# Patient Record
Sex: Female | Born: 1955 | Race: White | Hispanic: No | Marital: Married | Smoking: Current every day smoker
Health system: Southern US, Community
[De-identification: ages and names within clinical notes are randomized; demographics above are authoritative.]

## PROBLEM LIST (undated history)

## (undated) ENCOUNTER — Emergency Department (HOSPITAL_COMMUNITY): Disposition: A | Discharge status: Home / Self Care | Payer: Self-pay

## (undated) DIAGNOSIS — F32A Depression, unspecified: Secondary | ICD-10-CM

## (undated) DIAGNOSIS — L409 Psoriasis, unspecified: Secondary | ICD-10-CM

## (undated) DIAGNOSIS — Z9889 Other specified postprocedural states: Secondary | ICD-10-CM

## (undated) DIAGNOSIS — F419 Anxiety disorder, unspecified: Secondary | ICD-10-CM

## (undated) DIAGNOSIS — E7800 Pure hypercholesterolemia, unspecified: Secondary | ICD-10-CM

## (undated) DIAGNOSIS — G629 Polyneuropathy, unspecified: Secondary | ICD-10-CM

## (undated) DIAGNOSIS — I10 Essential (primary) hypertension: Secondary | ICD-10-CM

## (undated) DIAGNOSIS — K219 Gastro-esophageal reflux disease without esophagitis: Secondary | ICD-10-CM

## (undated) DIAGNOSIS — R112 Nausea with vomiting, unspecified: Secondary | ICD-10-CM

## (undated) DIAGNOSIS — M1990 Unspecified osteoarthritis, unspecified site: Secondary | ICD-10-CM

## (undated) DIAGNOSIS — R51 Headache: Secondary | ICD-10-CM

## (undated) DIAGNOSIS — F329 Major depressive disorder, single episode, unspecified: Secondary | ICD-10-CM

## (undated) HISTORY — PX: NECK SURGERY: SHX720

## (undated) HISTORY — PX: CARDIAC CATHETERIZATION: SHX172

---

## 1987-07-26 HISTORY — PX: TUBAL LIGATION: SHX77

## 2001-02-20 ENCOUNTER — Ambulatory Visit (HOSPITAL_COMMUNITY): Admission: RE | Admit: 2001-02-20 | Discharge: 2001-02-20 | Payer: Self-pay | Admitting: Orthopedic Surgery

## 2001-02-20 ENCOUNTER — Encounter: Payer: Self-pay | Admitting: Orthopedic Surgery

## 2001-03-05 ENCOUNTER — Ambulatory Visit (HOSPITAL_COMMUNITY): Admission: RE | Admit: 2001-03-05 | Discharge: 2001-03-05 | Payer: Self-pay | Admitting: Orthopedic Surgery

## 2001-07-25 HISTORY — PX: CHOLECYSTECTOMY: SHX55

## 2001-08-29 ENCOUNTER — Ambulatory Visit (HOSPITAL_COMMUNITY): Admission: RE | Admit: 2001-08-29 | Discharge: 2001-08-29 | Payer: Self-pay | Admitting: *Deleted

## 2001-08-29 ENCOUNTER — Encounter: Payer: Self-pay | Admitting: *Deleted

## 2001-10-15 ENCOUNTER — Ambulatory Visit (HOSPITAL_COMMUNITY): Admission: RE | Admit: 2001-10-15 | Discharge: 2001-10-15 | Payer: Self-pay | Admitting: Unknown Physician Specialty

## 2001-10-16 ENCOUNTER — Encounter: Payer: Self-pay | Admitting: Unknown Physician Specialty

## 2001-10-24 ENCOUNTER — Ambulatory Visit (HOSPITAL_BASED_OUTPATIENT_CLINIC_OR_DEPARTMENT_OTHER): Admission: RE | Admit: 2001-10-24 | Discharge: 2001-10-24 | Payer: Self-pay | Admitting: Orthopedic Surgery

## 2001-11-21 ENCOUNTER — Emergency Department (HOSPITAL_COMMUNITY): Admission: EM | Admit: 2001-11-21 | Discharge: 2001-11-21 | Payer: Self-pay | Admitting: *Deleted

## 2001-11-21 ENCOUNTER — Encounter: Payer: Self-pay | Admitting: Internal Medicine

## 2001-11-22 ENCOUNTER — Inpatient Hospital Stay (HOSPITAL_COMMUNITY): Admission: AD | Admit: 2001-11-22 | Discharge: 2001-11-24 | Payer: Self-pay | Admitting: General Surgery

## 2001-11-22 ENCOUNTER — Ambulatory Visit (HOSPITAL_COMMUNITY): Admission: RE | Admit: 2001-11-22 | Discharge: 2001-11-22 | Payer: Self-pay | Admitting: Internal Medicine

## 2001-11-22 ENCOUNTER — Encounter: Payer: Self-pay | Admitting: Internal Medicine

## 2001-11-27 ENCOUNTER — Emergency Department (HOSPITAL_COMMUNITY): Admission: EM | Admit: 2001-11-27 | Discharge: 2001-11-27 | Payer: Self-pay | Admitting: *Deleted

## 2001-11-27 ENCOUNTER — Encounter: Payer: Self-pay | Admitting: *Deleted

## 2002-01-14 ENCOUNTER — Ambulatory Visit (HOSPITAL_COMMUNITY): Admission: RE | Admit: 2002-01-14 | Discharge: 2002-01-14 | Payer: Self-pay | Admitting: Family Medicine

## 2002-01-14 ENCOUNTER — Encounter: Payer: Self-pay | Admitting: Family Medicine

## 2002-09-13 ENCOUNTER — Encounter: Payer: Self-pay | Admitting: *Deleted

## 2002-09-13 ENCOUNTER — Ambulatory Visit (HOSPITAL_COMMUNITY): Admission: RE | Admit: 2002-09-13 | Discharge: 2002-09-13 | Payer: Self-pay | Admitting: *Deleted

## 2002-11-12 ENCOUNTER — Ambulatory Visit (HOSPITAL_COMMUNITY): Admission: RE | Admit: 2002-11-12 | Discharge: 2002-11-12 | Payer: Self-pay | Admitting: Internal Medicine

## 2002-11-12 ENCOUNTER — Encounter: Payer: Self-pay | Admitting: Internal Medicine

## 2002-11-13 ENCOUNTER — Emergency Department (HOSPITAL_COMMUNITY): Admission: EM | Admit: 2002-11-13 | Discharge: 2002-11-13 | Payer: Self-pay | Admitting: Emergency Medicine

## 2002-11-25 ENCOUNTER — Encounter: Admission: RE | Admit: 2002-11-25 | Discharge: 2002-11-25 | Payer: Self-pay | Admitting: *Deleted

## 2002-11-25 ENCOUNTER — Encounter: Payer: Self-pay | Admitting: *Deleted

## 2002-12-06 ENCOUNTER — Ambulatory Visit (HOSPITAL_COMMUNITY): Admission: RE | Admit: 2002-12-06 | Discharge: 2002-12-07 | Payer: Self-pay | Admitting: Neurosurgery

## 2002-12-06 ENCOUNTER — Encounter: Payer: Self-pay | Admitting: Neurosurgery

## 2003-05-03 ENCOUNTER — Encounter: Payer: Self-pay | Admitting: Neurosurgery

## 2003-05-03 ENCOUNTER — Encounter: Admission: RE | Admit: 2003-05-03 | Discharge: 2003-05-03 | Payer: Self-pay | Admitting: Neurosurgery

## 2003-05-07 ENCOUNTER — Encounter: Payer: Self-pay | Admitting: Neurosurgery

## 2003-05-09 ENCOUNTER — Encounter: Payer: Self-pay | Admitting: Neurosurgery

## 2003-05-09 ENCOUNTER — Ambulatory Visit (HOSPITAL_COMMUNITY): Admission: RE | Admit: 2003-05-09 | Discharge: 2003-05-10 | Payer: Self-pay | Admitting: Neurosurgery

## 2003-09-19 ENCOUNTER — Ambulatory Visit (HOSPITAL_COMMUNITY): Admission: RE | Admit: 2003-09-19 | Discharge: 2003-09-19 | Payer: Self-pay | Admitting: *Deleted

## 2004-05-05 ENCOUNTER — Ambulatory Visit (HOSPITAL_COMMUNITY): Admission: RE | Admit: 2004-05-05 | Discharge: 2004-05-05 | Payer: Self-pay | Admitting: Orthopedic Surgery

## 2004-07-25 HISTORY — PX: FOOT SURGERY: SHX648

## 2004-07-25 HISTORY — PX: ENDOMETRIAL ABLATION: SHX621

## 2004-09-24 ENCOUNTER — Ambulatory Visit (HOSPITAL_COMMUNITY): Admission: RE | Admit: 2004-09-24 | Discharge: 2004-09-24 | Payer: Self-pay | Admitting: *Deleted

## 2004-09-29 ENCOUNTER — Other Ambulatory Visit: Admission: RE | Admit: 2004-09-29 | Discharge: 2004-09-29 | Payer: Self-pay | Admitting: *Deleted

## 2004-10-15 ENCOUNTER — Ambulatory Visit (HOSPITAL_COMMUNITY): Admission: RE | Admit: 2004-10-15 | Discharge: 2004-10-15 | Payer: Self-pay | Admitting: *Deleted

## 2004-10-25 ENCOUNTER — Ambulatory Visit (HOSPITAL_COMMUNITY): Admission: RE | Admit: 2004-10-25 | Discharge: 2004-10-25 | Payer: Self-pay | Admitting: Podiatry

## 2004-10-29 ENCOUNTER — Ambulatory Visit (HOSPITAL_COMMUNITY): Admission: RE | Admit: 2004-10-29 | Discharge: 2004-10-29 | Payer: Self-pay | Admitting: *Deleted

## 2004-11-26 ENCOUNTER — Ambulatory Visit (HOSPITAL_COMMUNITY): Admission: RE | Admit: 2004-11-26 | Discharge: 2004-11-26 | Payer: Self-pay | Admitting: *Deleted

## 2005-03-17 ENCOUNTER — Ambulatory Visit: Payer: Self-pay | Admitting: Orthopedic Surgery

## 2005-06-13 ENCOUNTER — Ambulatory Visit: Payer: Self-pay | Admitting: Orthopedic Surgery

## 2005-06-30 ENCOUNTER — Ambulatory Visit: Payer: Self-pay | Admitting: Orthopedic Surgery

## 2005-07-11 ENCOUNTER — Ambulatory Visit: Payer: Self-pay | Admitting: Orthopedic Surgery

## 2005-07-25 HISTORY — PX: REPLACEMENT TOTAL KNEE BILATERAL: SUR1225

## 2005-08-01 ENCOUNTER — Ambulatory Visit: Payer: Self-pay | Admitting: Orthopedic Surgery

## 2005-09-12 ENCOUNTER — Ambulatory Visit: Payer: Self-pay | Admitting: Orthopedic Surgery

## 2005-10-18 ENCOUNTER — Encounter (INDEPENDENT_AMBULATORY_CARE_PROVIDER_SITE_OTHER): Payer: Self-pay | Admitting: Specialist

## 2005-10-18 ENCOUNTER — Inpatient Hospital Stay (HOSPITAL_COMMUNITY): Admission: RE | Admit: 2005-10-18 | Discharge: 2005-10-21 | Payer: Self-pay | Admitting: Orthopedic Surgery

## 2005-10-18 ENCOUNTER — Ambulatory Visit: Payer: Self-pay | Admitting: Orthopedic Surgery

## 2005-11-07 ENCOUNTER — Encounter (HOSPITAL_COMMUNITY): Admission: RE | Admit: 2005-11-07 | Discharge: 2005-12-07 | Payer: Self-pay | Admitting: Orthopedic Surgery

## 2005-11-21 ENCOUNTER — Ambulatory Visit: Payer: Self-pay | Admitting: Orthopedic Surgery

## 2005-11-22 ENCOUNTER — Ambulatory Visit (HOSPITAL_COMMUNITY): Admission: RE | Admit: 2005-11-22 | Discharge: 2005-11-22 | Payer: Self-pay | Admitting: Obstetrics and Gynecology

## 2006-01-02 IMAGING — CR DG FOOT 2V*R*
2 series · 2 of 2 positions shown · non-contrast
Comparison: none

HISTORY: Right ganglion cyst, bone spur, preoperative evaluation

[view not recorded (1 of 2)]
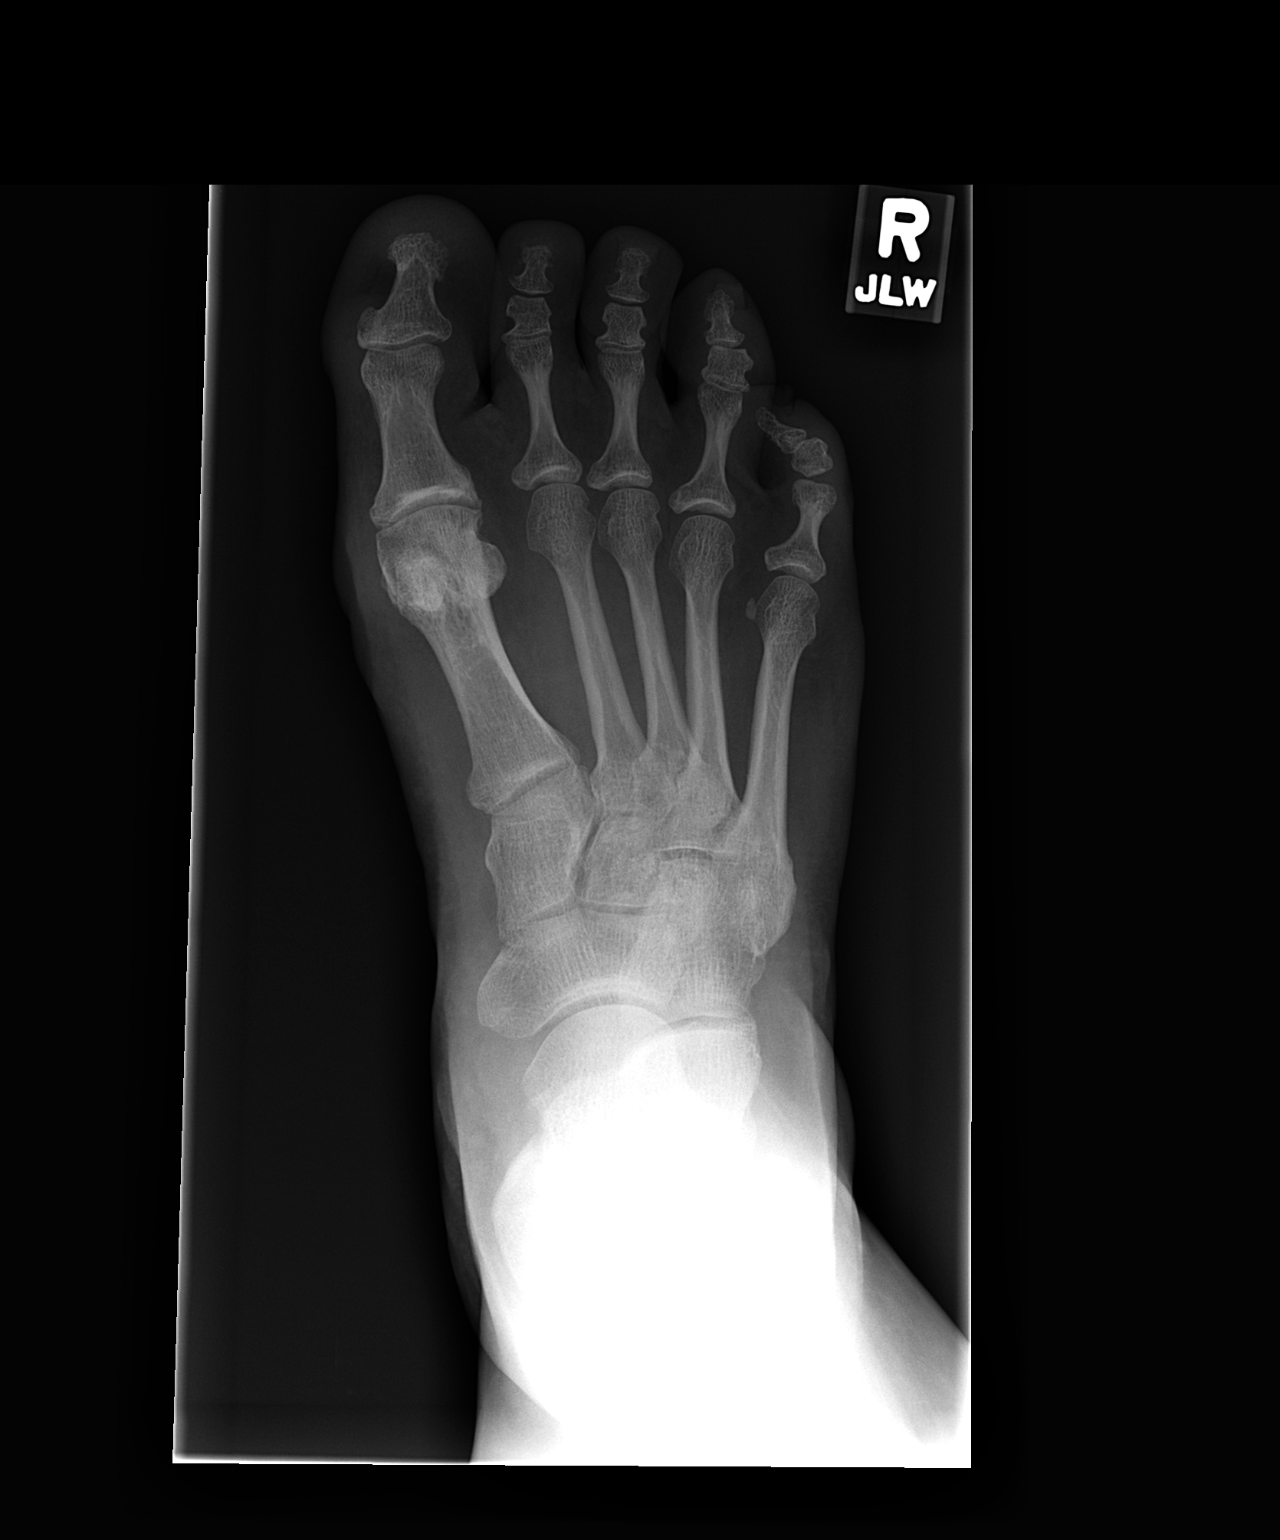

[view not recorded (2 of 2)]
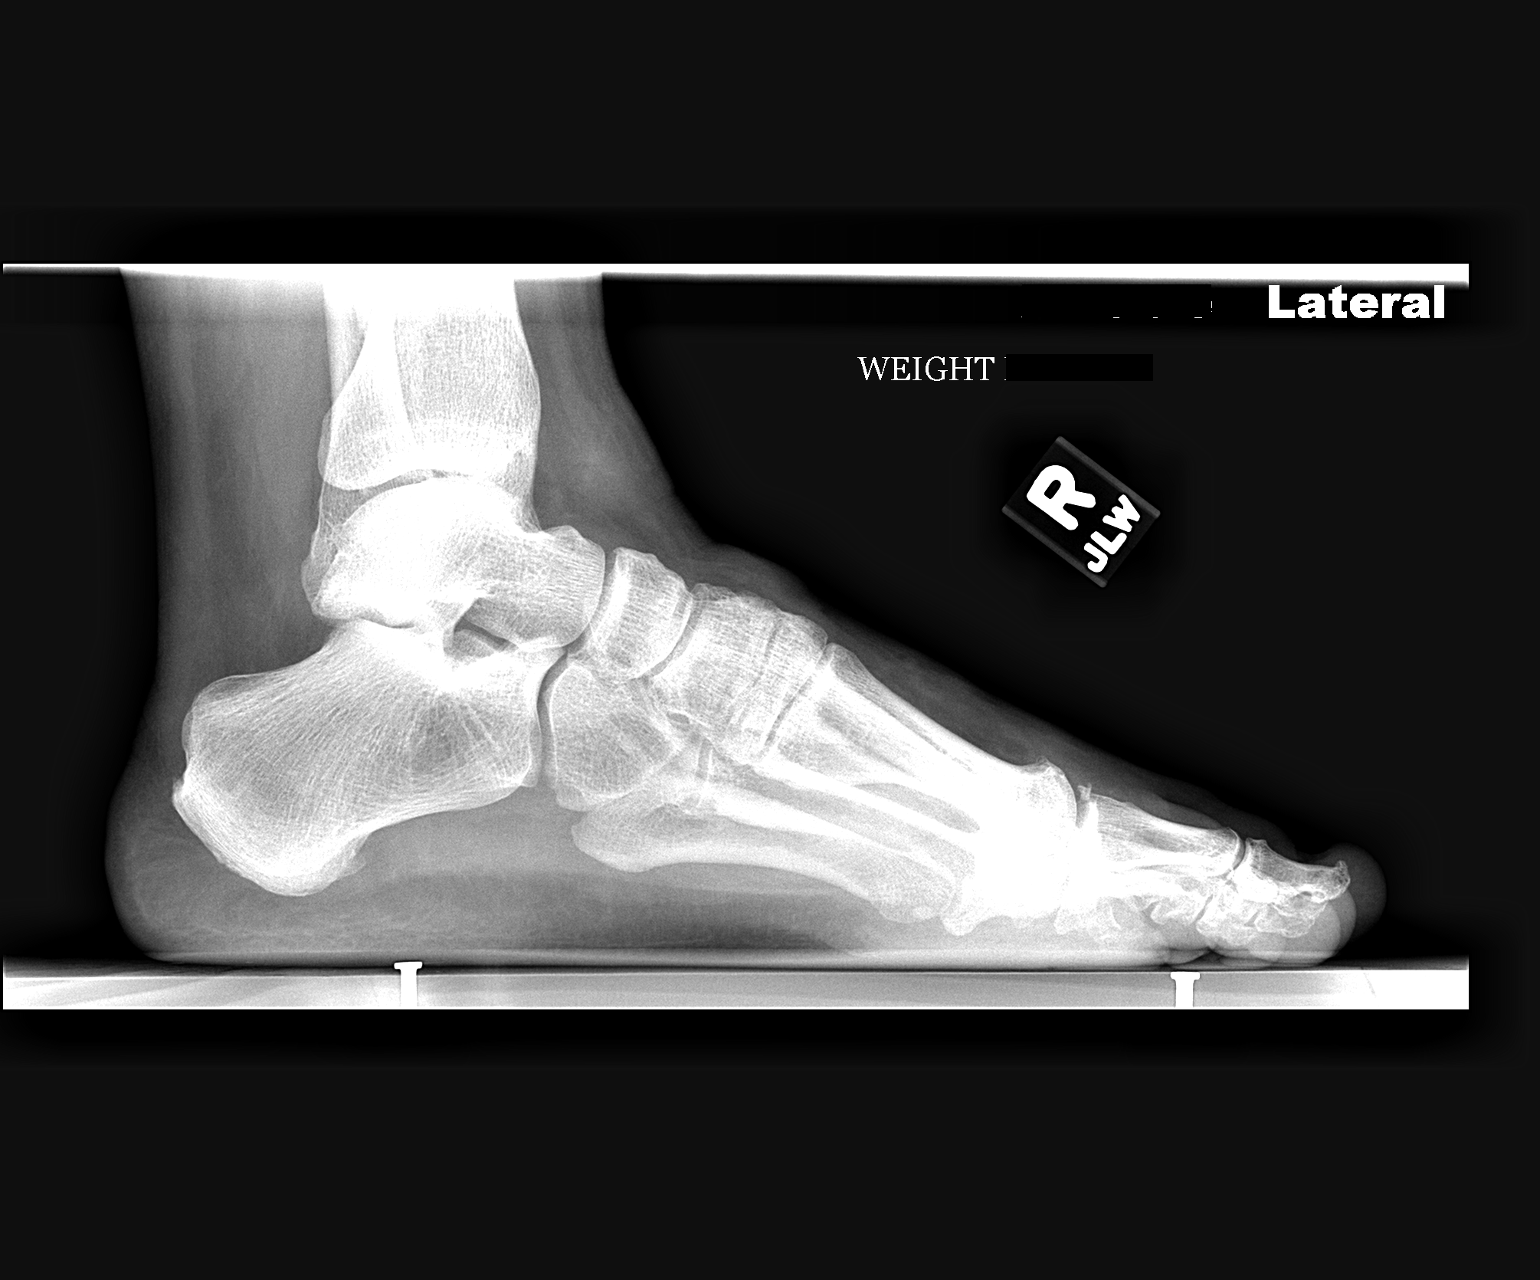

[2 of 2 positions shown; findings below may reference images not displayed]

RIGHT FOOT 2 VIEWS:

Weight-bearing AP and lateral views.
Mild degenerative changes first MTP joint.
No fracture, dislocation, or bone destruction.
Small spur or ossicle noted the dorsal margin of base of proximal phalanx great
toe.
No definite soft tissue mass radiographically evident, though there is slight
soft tissue swelling anterior to the tibiotalar joint on lateral view.
IMPRESSION: Degenerative changes first MTP joint.
Mild anterior soft tissue swelling at ankle.

## 2006-01-16 ENCOUNTER — Ambulatory Visit: Payer: Self-pay | Admitting: Orthopedic Surgery

## 2006-02-02 ENCOUNTER — Ambulatory Visit: Payer: Self-pay | Admitting: Orthopedic Surgery

## 2006-02-24 ENCOUNTER — Encounter (INDEPENDENT_AMBULATORY_CARE_PROVIDER_SITE_OTHER): Payer: Self-pay | Admitting: Specialist

## 2006-02-24 ENCOUNTER — Ambulatory Visit: Payer: Self-pay | Admitting: Orthopedic Surgery

## 2006-02-24 ENCOUNTER — Inpatient Hospital Stay (HOSPITAL_COMMUNITY): Admission: RE | Admit: 2006-02-24 | Discharge: 2006-02-27 | Payer: Self-pay | Admitting: Orthopedic Surgery

## 2006-03-06 ENCOUNTER — Ambulatory Visit: Payer: Self-pay | Admitting: Orthopedic Surgery

## 2006-03-20 ENCOUNTER — Ambulatory Visit: Payer: Self-pay | Admitting: Orthopedic Surgery

## 2006-04-03 ENCOUNTER — Ambulatory Visit: Payer: Self-pay | Admitting: Orthopedic Surgery

## 2006-06-26 ENCOUNTER — Ambulatory Visit: Payer: Self-pay | Admitting: Orthopedic Surgery

## 2006-08-13 ENCOUNTER — Emergency Department (HOSPITAL_COMMUNITY): Admission: EM | Admit: 2006-08-13 | Discharge: 2006-08-13 | Payer: Self-pay | Admitting: Emergency Medicine

## 2006-09-25 ENCOUNTER — Ambulatory Visit: Payer: Self-pay | Admitting: Orthopedic Surgery

## 2006-11-21 ENCOUNTER — Emergency Department (HOSPITAL_COMMUNITY): Admission: EM | Admit: 2006-11-21 | Discharge: 2006-11-21 | Payer: Self-pay | Admitting: Emergency Medicine

## 2006-12-14 ENCOUNTER — Ambulatory Visit: Payer: Self-pay | Admitting: Orthopedic Surgery

## 2007-07-10 ENCOUNTER — Ambulatory Visit: Payer: Self-pay | Admitting: Orthopedic Surgery

## 2007-07-10 DIAGNOSIS — M25569 Pain in unspecified knee: Secondary | ICD-10-CM | POA: Insufficient documentation

## 2007-07-10 DIAGNOSIS — IMO0002 Reserved for concepts with insufficient information to code with codable children: Secondary | ICD-10-CM | POA: Insufficient documentation

## 2007-07-26 HISTORY — PX: COLONOSCOPY: SHX174

## 2007-11-30 ENCOUNTER — Ambulatory Visit (HOSPITAL_BASED_OUTPATIENT_CLINIC_OR_DEPARTMENT_OTHER): Admission: RE | Admit: 2007-11-30 | Discharge: 2007-11-30 | Payer: Self-pay | Admitting: Internal Medicine

## 2007-12-01 ENCOUNTER — Ambulatory Visit: Payer: Self-pay | Admitting: Internal Medicine

## 2007-12-17 ENCOUNTER — Encounter: Payer: Self-pay | Admitting: Orthopedic Surgery

## 2007-12-17 ENCOUNTER — Emergency Department (HOSPITAL_COMMUNITY): Admission: EM | Admit: 2007-12-17 | Discharge: 2007-12-17 | Payer: Self-pay | Admitting: Emergency Medicine

## 2007-12-18 ENCOUNTER — Ambulatory Visit: Payer: Self-pay | Admitting: Orthopedic Surgery

## 2008-01-30 ENCOUNTER — Ambulatory Visit: Payer: Self-pay | Admitting: Orthopedic Surgery

## 2008-02-06 ENCOUNTER — Ambulatory Visit (HOSPITAL_COMMUNITY): Admission: RE | Admit: 2008-02-06 | Discharge: 2008-02-06 | Payer: Self-pay | Admitting: Obstetrics and Gynecology

## 2008-06-30 ENCOUNTER — Ambulatory Visit (HOSPITAL_COMMUNITY): Admission: RE | Admit: 2008-06-30 | Discharge: 2008-06-30 | Payer: Self-pay | Admitting: Family Medicine

## 2008-07-02 ENCOUNTER — Ambulatory Visit: Payer: Self-pay | Admitting: Gastroenterology

## 2008-07-02 LAB — CONVERTED CEMR LAB
AST: 23 units/L (ref 0–37)
Albumin: 4.4 g/dL (ref 3.5–5.2)
Alkaline Phosphatase: 95 units/L (ref 39–117)
BUN: 13 mg/dL (ref 6–23)
Creatinine, Ser: 1.02 mg/dL (ref 0.40–1.20)
Glucose, Bld: 121 mg/dL — ABNORMAL HIGH (ref 70–99)
TSH: 3.073 microintl units/mL (ref 0.350–4.50)
Total Bilirubin: 0.4 mg/dL (ref 0.3–1.2)

## 2008-07-04 ENCOUNTER — Ambulatory Visit: Payer: Self-pay | Admitting: Gastroenterology

## 2008-07-04 ENCOUNTER — Ambulatory Visit (HOSPITAL_COMMUNITY): Admission: RE | Admit: 2008-07-04 | Discharge: 2008-07-04 | Payer: Self-pay | Admitting: Gastroenterology

## 2008-07-04 ENCOUNTER — Encounter: Payer: Self-pay | Admitting: Gastroenterology

## 2008-07-09 ENCOUNTER — Other Ambulatory Visit: Admission: RE | Admit: 2008-07-09 | Discharge: 2008-07-09 | Payer: Self-pay | Admitting: Obstetrics and Gynecology

## 2008-07-10 ENCOUNTER — Ambulatory Visit (HOSPITAL_COMMUNITY): Admission: RE | Admit: 2008-07-10 | Discharge: 2008-07-10 | Payer: Self-pay | Admitting: Obstetrics and Gynecology

## 2008-08-13 ENCOUNTER — Ambulatory Visit: Payer: Self-pay | Admitting: Gastroenterology

## 2008-09-08 ENCOUNTER — Emergency Department (HOSPITAL_COMMUNITY): Admission: EM | Admit: 2008-09-08 | Discharge: 2008-09-08 | Payer: Self-pay | Admitting: Emergency Medicine

## 2009-02-06 ENCOUNTER — Ambulatory Visit (HOSPITAL_COMMUNITY): Admission: RE | Admit: 2009-02-06 | Discharge: 2009-02-06 | Payer: Self-pay | Admitting: Obstetrics and Gynecology

## 2009-09-29 ENCOUNTER — Observation Stay (HOSPITAL_COMMUNITY): Admission: EM | Admit: 2009-09-29 | Discharge: 2009-10-01 | Payer: Self-pay | Admitting: Emergency Medicine

## 2009-09-29 ENCOUNTER — Ambulatory Visit: Payer: Self-pay | Admitting: Internal Medicine

## 2009-11-20 ENCOUNTER — Ambulatory Visit (HOSPITAL_COMMUNITY): Admission: RE | Admit: 2009-11-20 | Discharge: 2009-11-20 | Payer: Self-pay | Admitting: Internal Medicine

## 2010-02-19 ENCOUNTER — Ambulatory Visit (HOSPITAL_COMMUNITY): Admission: RE | Admit: 2010-02-19 | Discharge: 2010-02-19 | Payer: Self-pay | Admitting: Obstetrics and Gynecology

## 2010-07-30 ENCOUNTER — Ambulatory Visit (HOSPITAL_COMMUNITY)
Admission: RE | Admit: 2010-07-30 | Discharge: 2010-07-30 | Payer: Self-pay | Source: Home / Self Care | Attending: Internal Medicine | Admitting: Internal Medicine

## 2010-08-03 ENCOUNTER — Emergency Department (HOSPITAL_COMMUNITY)
Admission: EM | Admit: 2010-08-03 | Discharge: 2010-08-03 | Payer: Self-pay | Source: Home / Self Care | Admitting: Family Medicine

## 2010-10-17 LAB — BASIC METABOLIC PANEL
BUN: 7 mg/dL (ref 6–23)
CO2: 28 mEq/L (ref 19–32)
CO2: 31 mEq/L (ref 19–32)
Chloride: 104 mEq/L (ref 96–112)
Chloride: 96 mEq/L (ref 96–112)
Creatinine, Ser: 0.92 mg/dL (ref 0.4–1.2)
Creatinine, Ser: 0.96 mg/dL (ref 0.4–1.2)
GFR calc Af Amer: >60 mL/min (ref >60)
Potassium: 2.9 mEq/L — ABNORMAL LOW (ref 3.5–5.1)
Sodium: 137 mEq/L (ref 135–145)

## 2010-10-17 LAB — DIFFERENTIAL
Eosinophils Absolute: 0.3 10*3/uL (ref 0.0–0.7)
Lymphs Abs: 3 10*3/uL (ref 0.7–4.0)
Monocytes Absolute: 0.5 10*3/uL (ref 0.1–1.0)
Monocytes Relative: 5 % (ref 3–12)
Neutrophils Relative %: 62 % (ref 43–77)

## 2010-10-17 LAB — COMPREHENSIVE METABOLIC PANEL
ALT: 18 U/L (ref 0–35)
ALT: 18 U/L (ref 0–35)
AST: 20 U/L (ref 0–37)
AST: 22 U/L (ref 0–37)
Alkaline Phosphatase: 85 U/L (ref 39–117)
CO2: 32 mEq/L (ref 19–32)
Calcium: 8.4 mg/dL (ref 8.4–10.5)
Calcium: 8.7 mg/dL (ref 8.4–10.5)
GFR calc Af Amer: >60 mL/min (ref >60)
GFR calc Af Amer: >60 mL/min (ref >60)
GFR calc non Af Amer: >60 mL/min (ref >60)
Potassium: 3 mEq/L — ABNORMAL LOW (ref 3.5–5.1)
Sodium: 139 mEq/L (ref 135–145)
Sodium: 141 mEq/L (ref 135–145)
Total Protein: 6.3 g/dL (ref 6.0–8.3)

## 2010-10-17 LAB — POCT CARDIAC MARKERS
CKMB, poc: <1 ng/mL — ABNORMAL LOW (ref 1.0–8.0)
CKMB, poc: <1 ng/mL — ABNORMAL LOW (ref 1.0–8.0)
Myoglobin, poc: 102 ng/mL (ref 12–200)
Myoglobin, poc: 77.3 ng/mL (ref 12–200)
Troponin i, poc: <0.05 ng/mL (ref 0.00–0.09)

## 2010-10-17 LAB — CBC
Hemoglobin: 16.5 g/dL — ABNORMAL HIGH (ref 12.0–15.0)
MCV: 90.9 fL (ref 78.0–100.0)
RBC: 5.27 MIL/uL — ABNORMAL HIGH (ref 3.87–5.11)
WBC: 10.3 10*3/uL (ref 4.0–10.5)

## 2010-10-17 LAB — BRAIN NATRIURETIC PEPTIDE: Pro B Natriuretic peptide (BNP): <30 pg/mL (ref 0.0–100.0)

## 2010-10-17 LAB — LIPID PANEL
HDL: 29 mg/dL — ABNORMAL LOW (ref >39)
Total CHOL/HDL Ratio: 5.6 RATIO
Triglycerides: 114 mg/dL (ref <150)

## 2010-10-17 LAB — CK TOTAL AND CKMB (NOT AT ARMC): Total CK: 187 U/L — ABNORMAL HIGH (ref 7–177)

## 2010-10-17 LAB — PROTIME-INR: Prothrombin Time: 13.3 seconds (ref 11.6–15.2)

## 2010-12-03 ENCOUNTER — Other Ambulatory Visit: Payer: Self-pay | Admitting: Neurosurgery

## 2010-12-03 DIAGNOSIS — M542 Cervicalgia: Secondary | ICD-10-CM

## 2010-12-07 NOTE — Consult Note (Signed)
NAME:  Dawn Kirk, Dawn Kirk                ACCOUNT NO.:  [id number]   MEDICAL RECORD NO.:  [id number]          PATIENT TYPE:  AMB   LOCATION:  DAY                           FACILITY:  [facility]   PHYSICIAN:  Kassie Mends, M.D.      DATE OF BIRTH:  December 29, 1955   DATE OF CONSULTATION:  07/02/2008  DATE OF DISCHARGE:                                 CONSULTATION   REFERRING PHYSICIAN:  Patrica Duel, MD   PRIMARY CARE PHYSICIAN:  Madelin Rear. Sherwood Gambler, MD   REASON FOR CONSULTATION:  Abdominal pain and constipation.   HISTORY OF PRESENT ILLNESS:  Dawn Kirk is a 55 year old female with a  longstanding history of anxiety due to many psychosocial stresses.  She  reports irregularity over the last three weeks.  She had severe pain  from her right upper quadrant to her right lower quadrant on Monday and  a CT scan was obtained.  She also reports that labs were drawn as well.  The lab results were not available to me today.   CT scan of the abdomen and pelvis with contrast obtained on June 30, 2008, showed nondilated loops of large and small bowel.  The pancreas  was normal and the gallbladder had been removed.  She reports having her  gallbladder removed in 2003 and she did have stones.  She was also noted  to have spondylolisthesis of L5-S1.  The terminal ileum and appendix  were well visualized and normal.   Because she reportedly had an elevated white count, she was put on  Cipro.  Her symptoms are unchanged after Cipro and Darvocet.  She has  had no bowel movements since Sunday.  She has been taking over-the-  counter Sustenex.  She has not had her thyroid checked in over a year.  She denies any diarrhea, blood in her stool, or weight loss.  Her  appetite has been decreased for 2-3 weeks.  She has been dealing with  issues of her father.  He had a heart attack in March 2009 and has not  really recovered from that.  She denies using any aspirin, BCs, or Goody  powders.  She has had no change in her  medication or diet.  She denies  any heartburn, indigestion, nausea, vomiting, or problem swallowing.  Appetite has been decreased for 2-3 weeks.  She feels like if she eats  her food kind of sits in the middle of her stomach.  The pain was so  severe on Monday that she was doubled over.   PAST MEDICAL HISTORY:  1. Hypertension.  2. Anxiety.   PAST SURGICAL HISTORY:  1. Neck surgery x2.  2. Knee replacement bilaterally.   ALLERGIES:  CODEINE.   MEDICATIONS:  1. Triamterene/HCTZ 37.5/25 daily.  2. Alprazolam 0.5 mg 2 nightly.  3. Venlafaxine 37.5 mg daily.  4. Cipro b.i.d.  5. Darvocet.  6. Sustenex.   FAMILY HISTORY:  She denies any family history of colon cancer, colon  polyps, breast, uterine, or ovarian cancer.   SOCIAL HISTORY:  She is married for 31 years.  She has  one child, 29.  She works for [employer].  She smokes one pack a day.  She denies any alcohol use.   REVIEW OF SYSTEMS:  As per the HPI, otherwise all systems negative.   PHYSICAL EXAMINATION:  VITAL SIGNS:  Weight 262-1/2 pounds, height 5  feet 4 inches, BMI 45 (severely obese).  Temperature 98, blood pressure  110/82, pulse 80.  GENERAL:  She is in no apparent distress.  Alert and oriented x4.HEENT:  Atraumatic, normocephalic.  Pupils equal, and react to light.MOUTH:  No  oral lesions.  Posterior pharynx without erythema or exudate.NECK:  Full  range of motion.  No lymphadenopathyLUNGS:  Clear to auscultation  bilaterally.CARDIOVASCULAR:  Regular rate and rhythm.  No murmur.  Normal S1 and S2.ABDOMEN:  Bowels are present, soft, nondistended,  obese, mild tenderness to palpation in the right upper and the right  lower quadrant without rebound or guarding.EXTREMITIES:  No cyanosis or  edema.NEURO:  She has no focal neurologic deficit.   ASSESSMENT:  Dawn Kirk is a 55 year old female who presents with  abdominal pain and change in her bowel habit. The differential diagnoses  include  irritable bowel constipation predominant, thyroid disease, and a  low likelihood of colorectal polyps or malignancy or colitis.  Thank you  for allowing me to see Dawn Kirk in consultation.   RECOMMENDATIONS:  1. Check lipase, complete metabolic panel and TSH today.  2. She will have colonoscopy on July 04, 2008 with OSMOprep.  I      did explain to her she needs to maintain her hydration.  I      explained to her that she will know that she is hydrated if her      urine is light yellow.  3. Will obtain labs from [place].  4. Follow up in six weeks.  If nothing is identified on her      colonoscopy then we will begin an aggressive bowel regimen.      Kassie Mends, M.D.  Electronically Signed     SM/MEDQ  D:  07/02/2008  T:  07/03/2008  Job:  [id number]   cc:   Madelin Rear. Sherwood Gambler, MD  Fax: [phone]

## 2010-12-07 NOTE — Op Note (Signed)
NAME:  Dawn Kirk, Dawn Kirk                ACCOUNT NO.:  [id number]   MEDICAL RECORD NO.:  [id number]          PATIENT TYPE:  AMB   LOCATION:  DAY                           FACILITY:  [facility]   PHYSICIAN:  Kassie Mends, M.D.      DATE OF BIRTH:  [birth year]   DATE OF PROCEDURE:  DATE OF DISCHARGE:                               OPERATIVE REPORT   REFERRING PHYSICIAN:  Madelin Rear. Fusco, MD   PROCEDURE:  Colonoscopy with cold forceps and snare cautery polypectomy.   INDICATION FOR EXAMINATION:  Dawn Kirk is a 55 year old female who was  seen for abdominal pain and constipation.  CT scan showed non-dilated  loops of large and small bowel.  She had an elevated white count, and  has been on Cipro.   FINDINGS:  1. Tortuous colon.  Multiple colon polyps removed via cold forceps and      snare cautery.  Polyps were sessile and located in the descending      colon, ascending colon, sigmoid colon, and rectum.  The ascending      colon polyp appeared adenomatous.  All, but one of the remaining      polyps appeared hyperplastic.  She did have one rectal polyp, which      did appear adenomatous.  The polyps ranged in size from 6 mm to 8      mm.  2. Rare sigmoid colon diverticula.  Otherwise, no masses, inflammatory      changes, or AVM seen.  3. Small internal hemorrhoids.  Three rectal polyps seen on retroflex      view and were removed via snare cautery.  Otherwise, no masses.   DIAGNOSES:  1. Multiple colorectal polyps.  2. Mild diverticulosis.   RECOMMENDATIONS:  1. She should drink 6-8 cups of water daily.  2. Add Benefiber twice daily and MiraLax twice daily.  3. If Dawn Kirk polyps are simple adenomatous or hyperplastic, then she      needs a screening colonoscopy in 10 years.  I will call Dawn Kirk with      the results of Dawn Kirk biopsies.  4. No aspirin, NSAIDs, or anticoagulation for 7 days.  5. She should follow high-fiber diet.  She is given a handout on high-      fiber diet, diverticulosis,  hemorrhoids, and polyps.  6. She already has a followup appointment to see me in 6 weeks.  If      Dawn Kirk constipation is unresolved with these measures, then would      consider adding Amitiza.   MEDICATIONS:  1. Demerol 100 mg IV.  2. Versed 6 mg IV.  3. Phenergan 12.5 mg IV.   PROCEDURE TECHNIQUE:  Physical exam was performed.  Informed consent was  obtained from the patient after explaining the benefits, risks, and  alternatives of the procedure.  The patient was connected to the monitor  and placed in left lateral position.  Continuous oxygen was provided by  nasal cannula and IV medicine administered through an indwelling  cannula.  After administration of sedation and rectal exam, the  patient's rectum was  intubated and the scope was advanced under direct  visualization to the cecum.  The scope was removed slowly by carefully  examining the color, texture, anatomy, and integrity of the mucosa on  the way out.  The patient was recovered in endoscopy and discharged home  in satisfactory condition.   PATH:  Simple adenomas and hyperplastic polyps. TCS in 10 years.      Kassie Mends, M.D.  Electronically Signed     SM/MEDQ  D:  07/04/2008  T:  07/05/2008  Job:  [id number]   cc:   Madelin Rear. Sherwood Gambler, MD  Fax: [phone]

## 2010-12-07 NOTE — Assessment & Plan Note (Signed)
Dawn Kirk, Dawn Kirk                 CHART#:  [id number]   DATE:  08/13/2008                       DOB:  [birth year]   CHIEF COMPLAINT:  Follow up of procedure.   SUBJECTIVE:  The patient is here for followup visit.  She was seen by  Dr. Kassie Mends at time of colonoscopy on 07/04/2008.  She had a  tortuous colon, multiple colonic polyps some were sessile throughout her  colon.  She had a rare sigmoid colon diverticula and small internal  hemorrhoids.  Pathology came back, simple adenomatous polyps.  Dr. Cira Servant  recommended she come back in 10 years for a followup colonoscopy.  She  was advised to take Benefiber twice daily and increase her water intake.  She already had started Sustenex.  She states she is doing very well  now.  She has not had to take the MiraLax as advised.  She is having a  daily bowel movement.  Denies any abdominal pain, melena, rectal  bleeding, dysphagia, odynophagia, nausea, vomiting, or heartburn.  She  is extremely pleased with her progress.  She has also lost 4 pounds,  which has been intentional.   CURRENT MEDICATIONS:  1. Triamterene/HCTZ daily.  2. Xanax 2 daily.  3. Sustenex daily.  4. Benefiber 4 capsules daily.  5. Venlafaxine 37.5 mg daily.   ALLERGIES:  Codeine.   PHYSICAL EXAMINATION:  VITAL SIGNS:  Weight 258.5, height 5 feet 4  inches, temp 98.5, blood pressure 118/80, and pulse 80.  GENERAL:  A pleasant, well-nourished, well-developed Caucasian female in  no acute distress.SKIN:  Warm and dry.  No jaundice.  **LIMITED PHYSICAL EXAM**   IMPRESSION:  The patient is a 55 year old lady with recent change in her  bowel movements and abdominal discomfort likely related to constipation.  Her bowel movements are now more regular and her symptoms have  completely resolved.  She has multiple simple adenomatous polyps at time  of recent colonoscopy.   PLAN:  1. She will continue Benefiber and Sustenex as before.  2. High fiber diet.  3. Follow  up surveillance colonoscopy in 10 years as recommended by      Dr. Kassie Mends.       Tana Coast, P.A.  Electronically Signed     Kassie Mends, M.D.  Electronically Signed    LL/MEDQ  D:  08/13/2008  T:  08/13/2008  Job:  [id number]   cc:   Madelin Rear. Sherwood Gambler, MD

## 2010-12-07 NOTE — Procedures (Signed)
NAME:  Dawn Kirk, Dawn Kirk                ACCOUNT NO.:  [id number]   MEDICAL RECORD NO.:  [id number]          PATIENT TYPE:  OUT   LOCATION:  SLEEP CENTER                 [facility]ACILITY[facility]  [facility]   PHYSICIAN:  Clinton D. Maple Hudson, MD, FCCP, FACPDATE OF BIRTH:  1956/01/[birth year]   DATE OF STUDY:  11/30/2007                            NOCTURNAL POLYSOMNOGRAM   REFERRING PHYSICIAN:  Madelin Rear. Fusco, MD   INDICATION FOR STUDY:  Hypersomnia with sleep apnea.   EPWORTH SLEEPINESS SCORE:  6/24.   BMI:  38.   WEIGHT:  260 pounds.   HEIGHT:  63 inches.   NECK:  16 inches.   HOME MEDICATIONS:  Charted and reviewed.  Significant for Effexor and  Xanax.   SLEEP ARCHITECTURE:  Total sleep time 344 minutes with sleep efficiency  87.5%.  Stage 1 was 6.8%.  Stage 2 was 87.4%.  Stage 3 absent.  REM 5.8%  of total sleep time.  Sleep latency 23 minutes.  REM latency 255  minutes.  Awake after sleep onset 25 minutes.  Arousal index 5.6.  Effexor and Xanax were taken at 8:20 p.m.   RESPIRATORY DATA:  Apnea/hypopnea index (AHI) 2.4 per hour, which is  within normal.  Fourteen events were scored, including 1 obstructive  apnea and 13 hypopneas.  Most events were recorded while sleeping  nonsupine.   OXYGEN DATA:  Very loud snoring with oxygen desaturation to a nadir of  84%.  Mean oxygen saturation through the study was 91.6% on room air.  A  total of 1.1 minutes was spent with saturation less than 88%.   CARDIAC DATA:  Normal sinus rhythm.   MOVEMENT/PARASOMNIA:  No significant movement disturbance.  No bathroom  trips.   IMPRESSIONS/RECOMMENDATIONS:  1. Occasional respiratory events with sleep disturbance, within normal      limits, AHI 2.4 per hour.  This is unlikely to explain a      significant complaint of daytime sleepiness.  Events were      associated with nonsupine sleep position, very loud snoring and      oxygen desaturation to a nadir of 84%.  2. CPAP is not usually considered indicated for scores  in this range      despite the loud snoring.  Weight loss,      treatment for significant nasal or upper airway obstruction and      possibly evaluation for an oral appliance might be considered.  The      effect of her medications on daytime sleepiness is not clear.      Clinton D. Maple Hudson, MD, [facility], FACP  Diplomate, Biomedical engineer of Sleep Medicine  Electronically Signed     CDY/MEDQ  D:  12/01/2007 11:26:18  T:  12/01/2007 12:03:35  Job:  [id number]

## 2010-12-09 ENCOUNTER — Other Ambulatory Visit: Payer: Self-pay | Admitting: Obstetrics & Gynecology

## 2010-12-09 ENCOUNTER — Other Ambulatory Visit (HOSPITAL_COMMUNITY)
Admission: RE | Admit: 2010-12-09 | Discharge: 2010-12-09 | Disposition: A | Discharge status: Home / Self Care | Payer: 59 | Source: Ambulatory Visit | Attending: Obstetrics and Gynecology | Admitting: Obstetrics and Gynecology

## 2010-12-09 ENCOUNTER — Other Ambulatory Visit: Payer: Self-pay | Admitting: Adult Health

## 2010-12-09 DIAGNOSIS — Z01419 Encounter for gynecological examination (general) (routine) without abnormal findings: Secondary | ICD-10-CM | POA: Insufficient documentation

## 2010-12-09 DIAGNOSIS — N852 Hypertrophy of uterus: Secondary | ICD-10-CM

## 2010-12-09 DIAGNOSIS — R102 Pelvic and perineal pain: Secondary | ICD-10-CM

## 2010-12-10 ENCOUNTER — Ambulatory Visit (HOSPITAL_COMMUNITY): Payer: 59

## 2010-12-10 ENCOUNTER — Ambulatory Visit (HOSPITAL_COMMUNITY)
Admission: RE | Admit: 2010-12-10 | Discharge: 2010-12-10 | Disposition: A | Discharge status: Home / Self Care | Payer: 59 | Source: Ambulatory Visit | Attending: Obstetrics & Gynecology | Admitting: Obstetrics & Gynecology

## 2010-12-10 ENCOUNTER — Ambulatory Visit
Admission: RE | Admit: 2010-12-10 | Discharge: 2010-12-10 | Disposition: A | Discharge status: Home / Self Care | Payer: 59 | Source: Ambulatory Visit | Attending: Neurosurgery | Admitting: Neurosurgery

## 2010-12-10 DIAGNOSIS — N949 Unspecified condition associated with female genital organs and menstrual cycle: Secondary | ICD-10-CM | POA: Insufficient documentation

## 2010-12-10 DIAGNOSIS — R102 Pelvic and perineal pain: Secondary | ICD-10-CM

## 2010-12-10 DIAGNOSIS — N852 Hypertrophy of uterus: Secondary | ICD-10-CM | POA: Insufficient documentation

## 2010-12-10 DIAGNOSIS — M542 Cervicalgia: Secondary | ICD-10-CM

## 2010-12-10 DIAGNOSIS — R9389 Abnormal findings on diagnostic imaging of other specified body structures: Secondary | ICD-10-CM | POA: Insufficient documentation

## 2010-12-10 NOTE — Discharge Summary (Signed)
NAMERAYLYNN, HERSH                ACCOUNT NO.:  [id number]   MEDICAL RECORD NO.:  [id number]          PATIENT TYPE:  INP   LOCATION:  A340                          FACILITY:  [facility]   PHYSICIAN:  Vickki Hearing, M.D.DATE OF BIRTH:  1955/08/20   DATE OF ADMISSION:  02/24/2006  DATE OF DISCHARGE:  08/06/2007LH                                 DISCHARGE SUMMARY   HISTORY:  Dortha his 55 years old.  She complained of right knee pain.  Radiographs showed a degenerative arthritis of the right knee.  She  presented for right total knee replacement.   PREOP DIAGNOSIS:  Osteoarthritis right knee.   POSTOP DIAGNOSIS:  Osteoarthritis right knee.   ADMITTING DIAGNOSIS:  Osteoarthritis right knee.   DISCHARGE DIAGNOSIS:  Osteoarthritis right knee.   PROCEDURE:  She had a right total knee replacement.   DATE OF SURGERY:  02/24/2006.  It was done under spinal anesthetic. It was  done by Dr. Romeo Apple.   IMPLANTS:  We used a DePuy cruciate substituting femoral component with  lugs, PFC sigma size 3 right knee.  Oval dome patella 3-peg 35 mm.  We used  a rotating platform tibial insert size 17.5; and we used a mobile bearing  tray with keel for the tibia, cemented size 3.  We used Rec[employer] consultant.  There were no complications of the surgery.   HOSPITAL COURSE:  The patient was admitted on 08/03 and discharged on 08/06.  The hospital course was uncomplicated at discharge.  She had hemoglobin  11.9.  Gait was 200 feet with the walker; she was independent with  transfers.  CPM was up to 60 degrees.   DISCHARGE MEDICATIONS:  1. Levaquin 750 oral daily for 10 days.  2. Dilaudid 2 mg oral q.4 h.  3. Klor-Con 10 mEq 4 tablets 2x a day.  4. Diltiazem 120 mg daily.  5. Hydrochlorothiazide 25 mg daily.  6. Xanax 0.5 mg h.s.  7. Effexor 37.5 mg one b.i.d.   FOLLOWUP VISIT:  1. Schedule 03/06/2006.  2. [facility] perform physical [facility].  She      was to have  potassium checked in 3 days.   CONDITION AT DISCHARGE:  Improved.      Vickki Hearing, M.D.  Electronically Signed     SEH/MEDQ  D:  03/14/2006  T:  03/14/2006  Job:  [id number]

## 2010-12-10 NOTE — Op Note (Signed)
[facility]  Patient:    Dawn Kirk, Dawn Kirk Visit Number: [id number] MRN: [id number]          Service Type: MED Location: 3A [phone] Attending Physician:  Dessa Phi Dictated by:   Elpidio Anis, M.D. Proc. Date: 11/23/01 Admit Date:  11/22/2001 Discharge Date: 11/24/2001                             Operative Report  PREOPERATIVE DIAGNOSES:  Cholelithiasis, cholecystitis.  POSTOPERATIVE DIAGNOSES:  Cholelithiasis, cholecystitis.  PROCEDURE:  Laparoscopic cholecystectomy.  SURGEON:  Elpidio Anis, M.D.  DESCRIPTION OF PROCEDURE:  Under general anesthesia, the patients abdomen was prepped and draped in a sterile field. A supraumbilical incision was made. The Veress needle was inserted uneventfully. The abdomen was insufflated with 3 liters of CO2. Using a Visiport guide, the 10 mm port was placed uneventfully. The laparoscope was placed and the gallbladder was visualized. There were some adhesions in the right lateral abdomen which were chronic in nature with no [place] or acute inflammatory process in this area. Under videoscopic guidance, a 10 mm port and two 5 mm ports were placed in the right upper quadrant using standard technique. The gallbladder was grasped by the assistant and positioned. The cystic duct was dissected, clipped with four clips and divided. The cystic artery was dissected, clipped with three clips and divided. Using the hook electrocautery device, the gallbladder was separated from the infrahepatic bed without difficulty. It was retrieved but there were four stones which spilled. They were retrieved. Copious irrigation was carried out until the fluid was clear. No drain was used. The bed of the liver showed no bleeding and no bile leak. CO2 was allowed to escape from the abdomen and the ports were removed. The incisions were closed using #0 Dexon on the fascia of the larger incisions and staples on the skin. Dressings were placed. He  was awakened from anesthesia uneventfully, transferred to a bed and taken to post anesthesia care unit. Dictated by:   Elpidio Anis, M.D. Attending Physician:  Dessa Phi DD:  11/23/01 TD:  11/26/01 Job: [id number]

## 2010-12-10 NOTE — Op Note (Signed)
   NAME:  Dawn Kirk, Dawn Kirk                          ACCOUNT NO.:  [id number]   MEDICAL RECORD NO.:  [id number]                   PATIENT TYPE:  OIB   LOCATION:  3008                                 FACILITY:  [facility]   PHYSICIAN:  Coletta Memos, M.D.                  DATE OF BIRTH:  1955/08/30   DATE OF PROCEDURE:  05/09/2003  DATE OF DISCHARGE:                                 OPERATIVE REPORT   PREOPERATIVE DIAGNOSES:  1. Cervical spondylosis, C4 to C6.  2. Cervical stenosis.  3. Cervical radiculopathy.   POSTOPERATIVE DIAGNOSES:  Dictation ended at this point.                                               Coletta Memos, M.D.    KC/MEDQ  D:  05/09/2003  T:  05/10/2003  Job:  [id number]

## 2010-12-10 NOTE — H&P (Signed)
Dawn Kirk, Dawn Kirk                ACCOUNT NO.:  [id number]   MEDICAL RECORD NO.:  [id number]          PATIENT TYPE:  AMB   LOCATION:  DAY                           FACILITY:  [facility]   PHYSICIAN:  Vickki Hearing, M.D.DATE OF BIRTH:  September 25, 1955   DATE OF ADMISSION:  DATE OF DISCHARGE:  LH                                HISTORY & PHYSICAL   CHIEF COMPLAINT:  Right knee pain.   HISTORY:  Dawn Kirk is 55 years old.  She is status post a left total knee  replacement October 18, 2005 at [facility] and did well.  At that  time, she had bilateral knee pain.  The left was hurting worse than right.  The right is now symptomatic.  The left is doing very well.   Past surgical history includes a cholecystectomy, two neck surgeries, a  cesarean section, a ganglion cyst removed from her foot, two wrist  surgeries, a tubal ligation.   She has severe medial knee pain, failure of nonoperative treatment.  She is  having difficulty with activities of daily living.  She understands the  risks and benefits of the surgery as well as the nonoperative treatment and  its risks of continued pain and wishes to proceed with right total knee  replacement.  Review of systems was normal.   Appearance was well-developed, well-nourished and slightly overweight.  She  had normal pulse perfusion without swelling or edema.  She was alert and  oriented x3 with normal mood, sensation, coordination and reflexes.  Her  upper extremities showed no evidence of malalignment, contracture,  subluxation, atrophy or tremor.  Her left knee incision has healed very  nicely.  Range of motion is 110 degrees.  The knee is stable.   The right knee is tender over the medial compartment.  Range of motion is 0  to 95.  She is noted to have stable knee ligaments and varus alignment.  Chest clear.  Heart rate and rhythm normal.  Abdomen soft.   DIAGNOSIS:  Osteoarthritis right knee.   PLAN:  Right total knee  replacement.      Vickki Hearing, M.D.  Electronically Signed     SEH/MEDQ  D:  02/23/2006  T:  02/23/2006  Job:  [id number]

## 2010-12-10 NOTE — H&P (Signed)
NAME:  [name], [name]                ACCOUNT NO.:  [id number]   MEDICAL RECORD NO.:  [id number]          PATIENT TYPE:  AMB   LOCATION:  DAY                           FACILITY:  [facility]   PHYSICIAN:  Langley Gauss, MD     DATE OF BIRTH:  14-May-1956   DATE OF ADMISSION:  DATE OF DISCHARGE:  LH                                HISTORY & PHYSICAL   ADDENDUM:  Dawn Kirk is scheduled for hysteroscopy, D and C and  endometrial ablation, scheduled for Nov 26, 2004.  Of note, this is re-  scheduled from a previous date when patient was noted to be hypokalemic.  She has subsequently been placed on oral potassium supplemental replacement  therapy.  Our pre-certification note in our office indicates that the  patient was scheduled registration and preoperative visit at [facility] dated Nov 23, 2004 at 10:00 o'clock A.M.  I have subsequently again  reviewed this and no changes were made per our office records from this Nov 23, 2004 10 o'clock A.M. [facility].  On  Nov 23, 2004 I was notified and paged by the office at 09:30 stating that  orders were needed on Dawn Kirk as she had been there since 08:00 waiting  for her preoperative visit to be performed.  The history I have subsequently  obtained is that late in the afternoon on Nov 22, 2004 Dawn Kirk had  contacted [facility] only, stating that she preferred to have her  preoperative testing at an earlier time and arrangements had been made with  her that she could come at 08:00 on Nov 23, 2004.  This was not communicated  to our office.  Thus, on Nov 23, 2004 I made a special trip to [facility] at 1000 to write the preoperative orders, having only been notified  at 0930 that she had been waiting since 0800.   This late change in patient's preoperative visit, per your staff, without  notification of our office during business hours, resulted in significant  inconvenience to the patient  as well as to extra work on my part to have  those orders [facility].  Certainly, I would expect in the  future, for better customer service, that should late changes be made by the  operating room staff that our office be notified during regular business  hours so that we could likewise make adjustments to our schedule.      DC/MEDQ  D:  11/24/2004  T:  11/24/2004  Job:  [id number]   cc:   [facility] [place]

## 2010-12-10 NOTE — Op Note (Signed)
NAME:  Dawn Kirk, Dawn Kirk                          ACCOUNT NO.:  [id number]   MEDICAL RECORD NO.:  [id number]                   PATIENT TYPE:  OIB   LOCATION:  3008                                 FACILITY:  [facility]   PHYSICIAN:  Coletta Memos, M.D.                  DATE OF BIRTH:  1955-09-06   DATE OF PROCEDURE:  05/09/2003  DATE OF DISCHARGE:  05/10/2003                                 OPERATIVE REPORT   PREOPERATIVE DIAGNOSES:  1. Cervical spondylosis without myelopathy, C4 to C6.  2. Cervical stenosis, C4 to C6.  3. Cervical radiculopathy.  4. Cervical degenerative disk disease.   POSTOPERATIVE DIAGNOSES:  1. Cervical spondylosis without myelopathy, C4 to C6.  2. Cervical stenosis, C4 to C6.  3. Cervical radiculopathy.  4. Cervical degenerative disk disease.   PROCEDURE:  1. Arthrodesis, C4 to C6.  2. Corpectomy, C5.  3. Strut graft, C4 to C6, using Synthes SynMesh.  4. Anterior instrumentation.  5. Removal of plate, U9-8.  6. Microdissection.   COMPLICATIONS:  None.   SURGEON:  Coletta Memos, M.D.   ASSISTANT:  Clydene Fake, M.D.   INDICATIONS:  Dawn Kirk is a patient of mine whom I took to the operating  room in May 2004 for an anterior cervical diskectomy and arthrodesis at C6-7  secondary to a large herniated disk on the left side causing a left C7  radiculopathy.  Postoperatively she did well and had relief of pain in the  left upper extremity, but she started to develop pain in her right upper  extremity which did not go away.  I did another MRI, and it showed that she  had significant spondylosis at C4-5, which she had had previously but had  not been causing her problems.  I therefore recommended and she agreed to  undergo an anterior cervical decompression with subsequent arthrodesis and  removal of the plate.   OPERATIVE NOTE:  Dawn Kirk was brought to the operating room, intubated,  and placed under general anesthesia without difficulty.  She had her  neck  prepped, and she was draped in a sterile fashion.  Her head was placed  essentially in a neutral position on a horseshoe head rest.  Her neck was  then prepped and she was draped in a sterile fashion.  I infiltrated 5 mL  0.5% lidocaine and 1:200,000 strength epinephrine into the subcutaneous  tissue at the site of my old incision.  Using my old incision I opened that  with a #10 blade and I took this down to the platysmal layer.  There was  significant amount of scar present, since I had just been through this area.  With dissection I was able to then expose the anterior cervical spine and  also expose the Synthes plate.  I then removed the plate along with four  screws and four locking screws without difficulty, and actually  that area  felt quite secure so I did not think instrumentation was needed over that  area.  I then proceeded to perform diskectomies at C4-5 and then at C5-6 but  felt that I would not be able to obtain adequate decompression with  diskectomies.  I then decided to do a corpectomy of C5 using a high-speed  air drill and Leksell rongeur along with Kerrison punches.  There was a  significant amount of compression on the spinal cord at this level, and  there was a long midline ridge compressing the cord.  I was able to remove  this with microdissection along with Dr. Phoebe Perch.  I then decompressed the C6  and C5 nerve roots bilaterally, again with Dr. Doreen Beam assistance.  I then  fastened the SynMesh cage, placing Chronos allograft with DBX bone putty in  it.  I was able to then position the cage into position as a strut between  the C4 and C6 vertebral bodies.  I then placed the plate, two screws in C4,  one screw in C6.  The other screw hole which I had used before did not  achieve good purchase, so I therefore did not place a screw there.  I then  irrigated the wound.  I took an x-ray but I was not able to see anything  secondary to her size.  I then closed the  wound in layered fashion using  Vicryl sutures.  She tolerated the procedure well.                                                 Coletta Memos, M.D.    KC/MEDQ  D:  05/09/2003  T:  05/10/2003  Job:  [id number]

## 2010-12-10 NOTE — Op Note (Signed)
Dawn Kirk, DEREMER                ACCOUNT NO.:  [id number]   MEDICAL RECORD NO.:  [id number]          PATIENT TYPE:  AMB   LOCATION:  DAY                           FACILITY:  [facility]   PHYSICIAN:  Langley Gauss, MD     DATE OF BIRTH:  May 27, 1956   DATE OF PROCEDURE:  11/26/2004  DATE OF DISCHARGE:  11/26/2004                                 OPERATIVE REPORT   PREOPERATIVE DIAGNOSES:  1.  Abnormal uterine bleeding.  2.  Menometrorrhagia.  3.  Dysmenorrhea.   PROCEDURES PERFORMED:  1.  Dilatation and curettage, fractional.  2.  Hysteroscopy.  3.  Endometrial ablation utilizing the HydroThermAblator II, total ablation      time eight minutes.   ANESTHESIA:  General endotracheal.   SPECIMENS:  A fractional D&C curettings to pathology for permanent section  only.   FINDINGS AT SURGERY:  Diffusely enlarged uterine cavity sounded to a depth  of 11 cm with a normal intrauterine contour.  Only moderate amounts of  normal-appearing tissue were obtained.   SUMMARY:  The patient is taken the operating room, vital signs were stable.  The patient underwent uncomplicated induction of general endotracheal  anesthesia, after which time she was placed in the dorsal lithotomy  position, prepped and draped in the usual sterile manner, the patient's  bladder previously emptied.  Speculum examination is performed, anterior lip  of cervix is grasped with a single-tooth tenaculum.  The uterus is noted to  sound to a depth of 11 cm.  Progressive dilatation is then performed up to a  size #21, dilator which allows passage of the hysteroscope through the  sleeve.  Photographs are taken of the intrauterine cavity.  There was poor  visualization secondary to some active bleeding and some frondy tissue  hanging from the uterine walls.  Of note, the patient had not been compliant  Megace preoperative suppressive therapy and had had two menstrual periods  within the past month.  Photographs were taken of  the uterine cavity.  The  hysteroscope was then removed.  Fractional dilatation curettage performed  first of the endocervical canal, second of the entire uterine cavity,  moderate amounts of tissue obtained and sent separately for fractionation.  The the hydroablator was  then utilized with Dr. Despina Hidden assisting and  instructing.  The disposable instrumentation balloon is appropriately tested  prior to insertion.  The electrode is noted to not be in contact with the  balloon.  During the procedure a total of 11 mL of sterile water is utilized  for insufflation purposes.  The instrument is inserted up to the uterine  fundus.  Attempt is made to keep the pressure between 180 and 210 and  manufacturer's instructions followed per preheating of the solution.  On the  first attempt ablation was performed for three minutes but despite my  efforts, the pressure did go up over 210, which caused the system to shut  down.  It thus had to be restarted and cycle through for appropriate  preheating.  At this point time the disposable instrument was removed.  It  was again rechecked, again reinserted up to the uterine fundus, which was  noted to sound to a depth of 11 cm.  After appropriate preparation, an  additional five minutes of hydrothermablation was performed without  complication.  Minimal active bleeding is noted to be occurring following  the procedure.  The speculum is well as single-tooth tenaculum are removed.  The patient is reversed of anesthesia, taken to the recovery room in stable  condition.  Pertinent laboratory studies include hemoglobin 15.9, hematocrit  46.1 sodium 138, potassium 3.9, chloride 103, CO2 26, serum HCG is negative.   HOSPITAL COURSE:  The patient is given a copy of standard discharge  instructions.  She did well following the procedure, was thus discharged to  home on today's same date of service.  Discharge medications include  Darvocet and  Motrin.  She is advised to  use the heating pad, follow up in  the office in one week's time.  Final pathology pending.      DC/MEDQ  D:  11/28/2004  T:  11/29/2004  Job:  [id number]

## 2010-12-10 NOTE — Op Note (Signed)
NAME:  Dawn Kirk, Dawn Kirk                ACCOUNT NO.:  [id number]   MEDICAL RECORD NO.:  [id number]          PATIENT TYPE:  AMB   LOCATION:  DAY                           FACILITY:  [facility]   PHYSICIAN:  Vickki Hearing, M.D.DATE OF BIRTH:  26-Jun-1956   DATE OF PROCEDURE:  02/24/2006  DATE OF DISCHARGE:                                 OPERATIVE REPORT   HISTORY AND INDICATIONS FOR PROCEDURE:  This is a 55 year old female, status  post left total knee replacement, who had bilateral osteoarthritis with end-  stage disease and varus deformity with bone-to-bone x-ray changes, pain and  definitive interference with activities of daily living, who presented for  right total knee.   PREOPERATIVE DIAGNOSIS:  Osteoarthritis of right knee.   POSTOPERATIVE DIAGNOSIS:  Osteoarthritis of right knee.   PROCEDURE:  Right total knee replacement.   IMPLANTS:  DePuy rotating platform, size 3 femur, 3 tibia, 38 patella, 17.5  rotating platform insert.   SURGEON:  Vickki Hearing, M.D.   ANESTHETIC:  General.   OPERATIVE FINDINGS:  Severe bone-to-bone changes, medial compartment; mild  to moderate changes, lateral compartment; severe changes, patellofemoral  compartment and 1 loose body; mild synovitis was also noted.   ASSISTANTS:  Wayne McFadder and Adella Hare.   TOURNIQUET TIME:  An hour and 31 minutes.   DESCRIPTION OF PROCEDURE:  This patient was identified in the preop holding  area as Dawn Kirk.  Her right knee was marked for surgery by the patient,  countersigned by the surgery and confirmed by updating the history and  physical.  Antibiotics were started on time and the patient was taken to the  operating room for general anesthetic.  After successful anesthesia, Foley  catheter was placed.  A tourniquet was placed on the right thigh.  Lateral  support was placed and a foot holder was placed.   After successful prep and drape, time-out procedure was completed and the  procedure was confirmed as a right total knee replacement on Dawn Kirk, antibiotics confirmed be given on time.   The leg was exsanguinated with a 6-inch Esmarch and the tourniquet was  inflated to 300 mmHg, where it stayed for 1 hour and 31 minutes.   A straight incision was made with the knee in flexion, centered over the  patella and extended down through the subcutaneous tissue to the underlying  extensor mechanism.  A medial arthrotomy was performed.  Patella was  everted.  A medial dissection was carried out to the posteromedial corner of  the knee.  ACL, PCL, medial and lateral menisci were excised.  Patellofemoral ligament was released and the ACL and PCL were also resected.  The tibia was subluxated forward.  Tibial guide was set for a neutral cut.  We referenced the lateral side and took off 10 mm of bone using an  oscillating saw.  We measured the tibia to a size 3.   We then used a drill bit to enter the femoral canal.  We irrigated and  suctioned it dry.  We set the distal femoral cut for 11  and removed 11 mm of  bone.  We checked the extension gap and a 10-mm insert fit very easily.   We then measured the femur to a size 3.5, down-sized to a size 3, made our 4  cuts, checked our flexion gap and our flexion gap measured 17.5 with a  spacer and this created a stable flexion space.  We then placed the shin and  17.5-mm spacer in extension and this gave [place] full extension.  We checked the  alignment with the rod and we got excellent alignment.  We then made a box  cut followed by rotation bullet trial with femoral implant and tibial  implant in place.  We marked the tibial alignment, made our hole in the  tibia for reaming and then followed it with a triangular-type punch.  We  then took the patella from a 23 down to a 14.  We drilled the 3 peg holes  for a 35 patella, trialed again and found no evidence of subluxation of the  patella.  We then removed all trial  components, irrigated the knee, dried  the bone, prepared for cementing and then cemented implants in place,  removed excess cement and put in a 17.5 polyethylene rotating platform  insert.  We then checked the knee for flexion and extension; we got full  extension; we got 120 degrees of flexion with a stable construct.  We then  irrigated the knee, removed any excess bone and cement, inserted a pain pump  catheter in the joint, closed the joint in flexion with interrupted #1  Bralon suture followed by 0 and 0 Vicryl.  We injected total of 30 mL of  Marcaine with epinephrine and then put on a sterile dressing, wrap the leg  from the foot to the groin and added a Cryo/Cuff.  She was extubated and  taken to the recovery room in stable condition.  We expect her to be full  weightbearing and she will be on Dilaudid for pain and Lovenox for DVT  prevention.      Vickki Hearing, M.D.  Electronically Signed     SEH/MEDQ  D:  02/24/2006  T:  02/24/2006  Job:  [id number]

## 2010-12-10 NOTE — Op Note (Signed)
[name], Dawn Kirk                ACCOUNT NO.:  [id number]   MEDICAL RECORD NO.:  [id number]          PATIENT TYPE:  EMS   LOCATION:  ED                            FACILITY:  [facility]   PHYSICIAN:  Tilda Burrow, M.D. DATE OF BIRTH:  1955/08/11   DATE OF PROCEDURE:  08/13/2006  DATE OF DISCHARGE:  08/13/2006                               OPERATIVE REPORT   ER EVALUATION AND PROCEDURE.   CHIEF COMPLAINT:  Swelling in the vaginal area.   A 55 year old female with a history of Bartholin abscess, treated once  with I&D for 2 weeks with early expulsion of the __Word__ catheter.  Presents to the emergency room after a 7 day history of progressive  swelling and discomfort in the area.  She has not been on antibiotics.  We spoke last night and she presented to the ER this morning.   EXAM:  Shows a huge 4 x 5 cm large cyst in the right labia majora.  Patient understands the procedure.  Bartholin's drainage is planned.   Bartholin's abscess drainage is performed by placing local anesthesia  just outside the hymen remnant on the patient's right side, raising a 2  mL wheal with 1% Xylocaine.  A #11 blade is used for a stab incision, an  X-shaped stab incision into the abscessed cavity, with generous  expulsion of fluid, purulence and debris.  Bartholin catheter is  inserted and 2 mL of saline instilled with a 25 gauge needle.  Catheter  is not showing any leakage at the completion of the procedure and easily  tucks internal into the vagina for long term placement.  Patient will  receive antibiotics as follows:  1. Doxycycline 100 mg b.i.d. x7 days.  2. Diflucan 150 mg now and repeat in 1 week.  3. Vicodin 5/500, #20, one q.4h. p.r.n. pain.   FOLLOW UP:  1. Six weeks for removal of Bartholin type cyst.  2. Return earlier as soon as possible if __Bartholins/Word___ catheter      leaks out.      Tilda Burrow, M.D.  Electronically Signed     JVF/MEDQ  D:  08/13/2006  T:   08/13/2006  Job:  [id number]

## 2010-12-10 NOTE — Op Note (Signed)
NAMECORRETTA, MUNCE                ACCOUNT NO.:  [id number]   MEDICAL RECORD NO.:  [id number]          PATIENT TYPE:  INP   LOCATION:  A337                          FACILITY:  [facility]   PHYSICIAN:  Vickki Hearing, M.D.DATE OF BIRTH:  07-11-1956   DATE OF PROCEDURE:  10/18/2005  DATE OF DISCHARGE:                                 OPERATIVE REPORT   INDICATIONS FOR PROCEDURE AND MEDICAL HISTORY:  This is a 55 year old female  who had arthroscopy of the right knee several years ago, cholecystectomy,  neck surgery, cesarean, ganglion cyst, 2 wrist surgeries, removal of cyst  from the dorsum of the foot.  She presented with left knee pain and bone-on-  bone changes on x-ray with severe medial pain interfering with activities of  daily living.  She failed nonoperative treatment; presented for left total  knee replacement.  She accepted risks and benefits of procedure, understood  that if she did not have surgery sure would have progressive deformity and  varus, continued flexion contracture and difficulties with pain and  activities.   PREOP DIAGNOSIS:  Osteoarthritis left knee.   POSTOP DIAGNOSIS:  Osteoarthritis left knee.   PROCEDURE:  Left total knee replacement.   IMPLANTS:  DePuy rotating platform total knee replacement.  We used a  posterior stabilized knee, 35 patella, 3 tibia, 3 femur, 10-mm polyethylene  posterior stabilized rotating platform insert.   SURGEON:  Vickki Hearing, M.D.   ANESTHETIC:  General   OPERATIVE FINDINGS:  Severe osteoarthritis of the left knee.  All 3  compartments involved, multiple osteophytes.  On the table her range of  motion was 20 to 90 under anesthesia (that was preop), postop she was 1-110.   ASSISTANTS:  Irena Reichmann.   TOURNIQUET TIME:  1 hour 45 minutes with pressure at 350 mmHg   SPECIMENS:  Bone fragments, they went to pathology.   BLOOD LOSS:  Minimal.   COMPLICATIONS:  None, after surgery the patient was stable and went  to PACU.   DETAILS OF PROCEDURE:  The patient marked her left knee as a surgical site,  I countersigned that.  We updated the history and physical.  She was started  on IV Ancef and taken to the operating room for general anesthesia.  We then  had a Foley catheter placed.  Tourniquet applied to the left thigh; left  lower extremity was prepped with DuraPrep, draped sterilely with sterile  normal technique.   A time-out was completed.  Tourniquet was elevated; and a straight incision  was made and centered over the patella extended proximally and distally down  to the extensor mechanism. A medial arthrotomy was performed, patella was  everted.  A medial release was performed to correct the varus deformity; and  the tibial guide was set for neutral cut.  I removed 10 mm of bone  referencing from then lateral side.  We measured the tibia to a size 3.   We introduced an intramedullary rod through a drilled hole in the distal  femur, applied the distal cutting block set it for an 11-mm cut.  We  made  the 11-mm cut; extension gap was still tight.  We took an additional 4 mm of  bone and then our spacer block fit well.  We did require further  posteromedial release at the level of the joint line.  This opened up the  extension gap and allowed balance of ligaments in extension.   We sized the femur to a 3.5, downsized to a size 3, then made the 4 cuts off  of a 3-cutting jig.  We then checked the flexion gap.  A 10 mm spacer fit  very well with good stability.  We rechecked the extension gap with the shim  in place; and found equal flexion/extension gaps.  We then tested the trial  implants for rotating platform got 110 degrees of flexion, full extension,  no evidence of spin out, and good stability in the AP plane.   We then prepared the patella for resection.  We did a synovectomy, removed  the osteophytes, sized it to a 35, and drilled the 3 peg holes.  We then  irrigated the knee, made  the punch and bushing drill in the tibia, prepared  the cement, and applied the implants and allowed the cement to cure.  We  removed excess cement, and applied the 10-mm polyethylene insert.  I checked  the final flexion/extension and range of motion was again 0-110.  Collateral  ligaments were stable on flexion-extension.   We then injected 30 mL of Marcaine with epinephrine in the soft tissues.  Introduced an intra-articular catheter; closed the capsule with 1 Bralon  interrupted suture with the knee in flexion; closed the subcu with #0 and 2-  0 Monocryl; and added staples to the skin; and activated the pain pump.  Catheter placed, sterile dressings, cryo cuff, and took the patient to  recovery room in stable condition.  Postop plan is for normal postop total  knee protocol.      Vickki Hearing, M.D.  Electronically Signed     SEH/MEDQ  D:  10/18/2005  T:  10/18/2005  Job:  [id number]

## 2010-12-10 NOTE — H&P (Signed)
Dawn Kirk, Dawn Kirk                ACCOUNT NO.:  [id number]   MEDICAL RECORD NO.:  [id number]          PATIENT TYPE:  AMB   LOCATION:  DAY                           FACILITY:  [facility]   PHYSICIAN:  Vickki Hearing, M.D.DATE OF BIRTH:  1956-02-08   DATE OF ADMISSION:  DATE OF DISCHARGE:  LH                                HISTORY & PHYSICAL   CHIEF COMPLAINT:  Bilateral knee pain.   HISTORY OF PRESENT ILLNESS:  On this occasion, the patient presents for left  total knee replacement.  She is a 55 year old female who has had arthroscopy  on the right knee, which was done several years ago.  She had a  cholecystectomy, 2 neck surgeries, cesarean, ganglion cyst removed from  dorsum of the foot, 2 wrist surgeries, a tubal ligation and has had failure  of nonoperative treatment of her knee.  She has severe medial knee pain  associated with some popping.  She is having trouble with her normal  activities and wishes to go ahead and proceed with a left total knee  replacement.  A preop visit with explantation, teaching, model review with  the patient -- handout was given -- and risks and benefits discussed; the  patient agrees to go ahead and proceed with the surgery.   REVIEW OF SYSTEMS:  Normal.   PHYSICAL EXAMINATION:  GENERAL APPEARANCE:  Well-developed, well-nourished  female may be slightly overweight.  CARDIOVASCULAR:  Normal pulse perfusion.  No swelling.  NEUROPSYCHIATRIC:  She is alert and oriented x3, normal mood, normal  sensation, coordination and reflexes.  EXTREMITIES:  Upper extremities show no evidence of malalignment,  contracture, subluxation or tremor.  Left knee examination reveals range of  motion of approximately 0-115; leg size blocks further knee flexion.  She  has medial compartment tenderness and some tenderness in the pes bursa.  Her  knee is stable and is in varus alignment.  She has good muscle strength and  muscle tone.   RADIOLOGIC FINDINGS:  Radiographs  show osteoarthritis of the left knee with  bone-to-bone changes.  Cyst formation is noted as well.   IMPRESSION:  Osteoarthritis, left knee.   PLAN:  Left total knee replacement.      Vickki Hearing, M.D.  Electronically Signed     SEH/MEDQ  D:  10/17/2005  T:  10/18/2005  Job:  [id number]

## 2010-12-10 NOTE — Discharge Summary (Signed)
Dawn Kirk, Dawn Kirk                ACCOUNT NO.:  [id number]   MEDICAL RECORD NO.:  [id number]          PATIENT TYPE:  INP   LOCATION:  A337                          FACILITY:  [facility]   PHYSICIAN:  Vickki Hearing, M.D.DATE OF BIRTH:  1955/07/26   DATE OF ADMISSION:  10/18/2005  DATE OF DISCHARGE:  LH                                 DISCHARGE SUMMARY   ADDENDUM TO DISCHARGE SUMMARY:   MEDICATION LIST:  1.  Effexor 37.5 mg orally at bedtime.  2.  K-Dur 20 mEq orally b.i.d.  3.  Iron Feosol 45 mg orally three times a day.  4.  Hydrochlorothiazide 12.5 mg orally daily.  5.  Diltiazem 125 mg orally daily.      Vickki Hearing, M.D.  Electronically Signed     SEH/MEDQ  D:  10/21/2005  T:  10/23/2005  Job:  [id number]

## 2010-12-10 NOTE — Op Note (Signed)
NAME:  Dawn Kirk, Dawn Kirk                          ACCOUNT NO.:  [id number]   MEDICAL RECORD NO.:  [id number]                   PATIENT TYPE:  OIB   LOCATION:  3039                                 FACILITY:  [facility]   PHYSICIAN:  Coletta Memos, M.D.                  DATE OF BIRTH:  June 11, 1956   DATE OF PROCEDURE:  12/06/2002  DATE OF DISCHARGE:  12/07/2002                                 OPERATIVE REPORT   PREOPERATIVE DIAGNOSES:  1. __________  left C6-7.  2. Cervical radiculopathy.  3. Cervical spondylosis without myelopathy.   POSTOPERATIVE DIAGNOSES:  1. __________  left C6-7.  2. Cervical radiculopathy.  3. Cervical spondylosis without myelopathy.   OPERATION/PROCEDURE:  1. Anterior cervical decompression, C6-7.  2. Arthrodesis C6-7 with anterior plating and allograft.   COMPLICATIONS:  None.   SURGEON:  Coletta Memos, M.D.   ASSISTANT:  Hewitt Shorts, M.D.   INDICATIONS:  [name] is a 55 year old who presented with acute pain in  the left upper extremity secondary to what was a large herniated disk at C6-  7 on the left side.  She has been hurting like this since April 18.  She has  significant weakness in the triceps and on MRI reveals a large disk  herniation.  I therefore recommended and she agreed to undergo anterior  cervical decompression and arthrodesis.   DESCRIPTION OF PROCEDURE:  Mrs. Vidana was brought to the operating room,  intubated and placed under general anesthetic without difficulty.  She was  positioned with the head in slight extension with 10 pounds of traction  applied via a chin strap.  Her neck was prepped and she was draped in the  sterile fashion.  I infiltrated 10 mL of 0.5% lidocaine, 1:200,000  epinephrine into my proposed incision.  I opened the skin and took this down  to the platysma.  I then dissected rostral and caudal in the plane superior  to the platysma.  After opening the platysma, I also dissected it rostrally  and  caudally inferior to it.  It took a number of x-rays to finally  localize.  Her neck was quite short and she did have a large, thick neck in  addition.  But when I was finally able to achieve localization, I then  reflected the longus colli muscles laterally and placed a self-retaining  Caspar retractor overlying the disk space of C6-7.  Distraction pins were  placed, one at C6, the other one at C7.  I opened the disk space with a #15  blade and removed some disk material using curets.  I then distracted the  space and with the microscope in the operative field, completed the  diskectomy.  I removed a significant amount of soft disk from the left side  of C6-7.  This was also removed from the neural foramen with the assistance  of Dr. Newell Coral.  After decompressing the cervical spine in this area and  removing the disk, I then drove the end plates.  I then placed a 7 mm graft  MTF into the space.  The  wound was then irrigated.  I then closed the wound in layered fashion.  I  did not take another x-ray as it would have been impossible to see the C6-7  space.  I reapproximated the platysma, subcutaneous tissues and used  Dermabond for sterile dressing.  The patient postoperatively was moving all  extremities well.                                                 Coletta Memos, M.D.    KC/MEDQ  D:  12/06/2002  T:  12/07/2002  Job:  [id number]

## 2010-12-10 NOTE — Procedures (Signed)
[facility]  Patient:    Dawn Kirk, Dawn Kirk Visit Number: [id number] MRN: [id number]          Service Type: OUT Location: [facility] Attending Physician:  Darlin Priestly Dictated by:   Kari Baars, M.D. Proc. Date: 11/27/01 Admit Date:  01/14/2002 Discharge Date: 01/14/2002                            EKG Interpretations  TIME:  2145, Nov 27, 2001.  Rhythm appears to be sinus rhythm with a rate of about 100.  Small Q waves are seen inferiorly which may be due to previous inferior infarction, but clinical correlation is suggested.  There are also small Q waves laterally which may be due to previous lateral myocardial infarction.  There are ST-T wave changes diffusely which are not specific.  IMPRESSION:  Abnormal electrocardiogram. Dictated by:   Kari Baars, M.D. Attending Physician:  Darlin Priestly DD:  01/30/02 TD:  02/03/02 Job: [id number]

## 2010-12-10 NOTE — H&P (Signed)
NAME:  Dawn Kirk, Dawn Kirk                ACCOUNT NO.:  [id number]   MEDICAL RECORD NO.:  [id number]          PATIENT TYPE:  AMB   LOCATION:  DAY                           FACILITY:  [facility]   PHYSICIAN:  Langley Gauss, MD     DATE OF BIRTH:  08/30/1955   DATE OF ADMISSION:  10/15/2004  DATE OF DISCHARGE:  LH                                HISTORY & PHYSICAL   PROCEDURE PLANNED:  Endometrial ablation October 15, 2004.   Patient is a 55 year old female who complains of menstrual problems in that  she will go spot for three to four days, then have menses for three to four  days with clots and pelvic pressure.  Patient states that menstrual periods  had previously been q.21-24 days.  This was one year previously at time of  her examination.  She now states that her periods have become heavier with  passage of clots and longer in duration such that out of four weeks of the  month she only has one bleeding-free week.  One year previously had  discussed with the patient endometrial ablation.  She considered her options  at that time.  Over the subsequent one year menstrual problems have  increased to the point where she desires endometrial ablation at this time.  The patient previously had complained of hot flashes which have now pretty  much ceased.  She is noted to be at increased risk of hyperplasia due to her  obesity.  Work-up to this point in time has included ultrasound done September 24, 2004 which revealed endometrial stripe within normal limits for age,  heterogeneous endometrium.  No definite fibroids were seen.  Normal  sonographic appearance of the ovaries.  Patient has a class I __________ .  FSH is within the postmenopausal range at 37.4.  The patient has undergone  endometrial biopsy performed in the office utilizing paracervical block  which was noted to reveal benign fragments proliferative phase endometrium.  No hyperplasia or tumor was identified.  Patient after the biopsy was placed  on suppressive therapy consisting of Megace 40 mg p.o. daily in an effort to  suppress endometrial proliferation.   CURRENT MEDICATIONS:  1.  She does have hypertension.  She takes Cardizem, hydrochlorothiazide.  2.  She is also on Effexor.  Effexor has been started predominantly to treat      her hot flashes.  3.  She also takes Xanax 0.5 mg p.o. daily p.r.n.   ALLERGIES:  She states she is allergic to CODEINE which gives her somewhat  of an out-of-body experience.   PAST MEDICAL HISTORY:  1.  Gallbladder removed May 2003.  2.  Left knee arthroscopy April 2003.  3.  Previous history of reflux for which she had previously taken Nexium.  4.  Patient previously had taken low dose hormonal replacement therapy for      perimenopausal symptoms, however, is no longer taking any hormone      replacement therapy.   SOCIAL HISTORY:  Patient previously one to two pack per day cigarette  smoker.  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   PHYSICAL EXAMINATION:  GENERAL:  She is noted to be morbidly obese.  VITAL SIGNS:  Weight 263 pounds, blood pressure 123/86, pulse rate 81,  respiratory rate 20.  HEENT:  Negative.  No adenopathy.  NECK:  Supple.  Thyroid is nonpalpable.  LUNGS:  Clear.  CARDIOVASCULAR:  Regular rate and rhythm.  ABDOMEN:  Soft and nontender.  Incisions from previous gallbladder noted.  EXTREMITIES:  Normal with only trace edema.  PELVIC:  Normal external genitalia.  No lesions or ulcerations identified.  The bladder is well supported.  Cervix is noted to be without lesions.  Bimanual examination:  The uterus and ovaries are nonpalpable secondary to  obesity but no pelvic masses are appreciated.   LABORATORIES:  Class I Pap smear.  FSH elevated at 37.4.  Hemoglobin 15.3,  hematocrit 48.2.  Random glucose 106.  TSH normal at 2.63.  Potassium  performed September 20, 2004 is normal at 4.0.  Previous endometrial biopsy:  Proliferative phase  endometrium.   ASSESSMENT/PLAN:  Morbidly obese perimenopausal/postmenopausal female with  mildly elevated FSH at 37.4 with reassuring endometrial biopsy and  essentially normal pelvic sonogram.  Patient currently on Megace suppressive  therapy.  She does desire relief from her current menstrual complaints,  primarily of the long duration of bleeding as well as passage of clots.  Discussed with the patient performance of endometrial ablation as an  alternative to hysterectomy for menstrual relief.  Will proceed with  hysteroscopy and uterine curettage as clinically indicated followed by  endometrial ablation.  The patient is aware that endometrial ablation is for  relief of bleeding symptoms only and certainly would have little effect on  any significant complaints of pelvic pressure.  Risks of surgery to include  risks associated with the anesthesia as well as uterine perforation  associated with the D&C and hysteroscopic procedure.  Plan to proceed on  October 15, 2004 on an outpatient basis.  Patient has made arrangements for  scheduled time off of work on that date.      DC/MEDQ  D:  10/15/2004  T:  10/15/2004  Job:  [id number]

## 2010-12-10 NOTE — Op Note (Signed)
NAMELUBERTHA, Dawn Kirk                ACCOUNT NO.:  [id number]   MEDICAL RECORD NO.:  [id number]          PATIENT TYPE:  AMB   LOCATION:  DAY                           FACILITY:  [facility]   PHYSICIAN:  Oley Balm. Pricilla Holm, D.P.M.DATE OF BIRTH:  1955/10/31   DATE OF PROCEDURE:  DATE OF DISCHARGE:                                 OPERATIVE REPORT   PREOPERATIVE DIAGNOSIS:  Ganglionic cyst with metatarsal cuneiform  hyperostosis.   PROCEDURE:  Metatarsal cuneiform exostosis.   SURGEON:  Oley Balm. Pricilla Holm, D.P.M.   TYPE OF ANESTHESIA:  Monitored anesthesia care.   INDICATIONS FOR SURGERY:  Longstanding history of pain unrelieved by  conservative care.   The patient was brought to the operating room and placed on the operating  table in supine position.  The patient's lower right foot and leg was then  prepped and draped in the usual aseptic manner.  Then with an ankle  tourniquet placed and well-padded to prevent contusion and elevated to 250  mmHg after exsanguination of the right foot, the following surgical  procedures were then performed under monitored anesthesia care:   Procedure:  EXCISION OF GANGLIONIC CYST WITH RESECTION OF METATARSAL  CUNEIFORM HYPEROSTOSIS:  Attention was directed to the dorsal aspect of the  right foot at the level of the base of the second and third metatarsals and  the mid-tarsal joint region.  Before the skin incision, because the  subcutaneous mass appeared right over the artery of the area, the artery was  mapped out and marked via a marking pen.  Then an incision was made on the  dorsal aspect of the right foot from about the base of the second metatarsal  proximally.  It was about 4 cm in length.  The incision was widened and  deepened via sharp and blunt dissection, making sure to identify and retract  all vital structures.  It should be noted that the neurovascular bundle, the  dorsalis pedis, its veins and the deep peroneal nerve were isolated and  identified and retracted laterally.  At this time a large knot appeared into  the wound, which at first we thought was a ganglionic cyst but subsequently  we discovered was more of a chondral lesion.  This appeared to be a large  chondroma on the dorsal aspect of the foot and was white and glistening.  We  removed the soft tissue around from this area, and it did appear to be a  cartilaginous exostosis.  The wound was lavaged with a copious amount of  sterile saline and the lesion was first of all resected utilizing an  osteotome and mallet and then the rest rasped smooth utilizing a Zimmer bur.  There was no ganglionic cyst noted in the area.  The wound was lavaged with  a copious amount of sterile saline and the capsular and subcutaneous tissues  approximated with a continuous running suture of 4-0 Dexon.  The skin was  approximated utilizing running subcuticular suture of 4-0 Dexon.   All surgical sites were then infiltrated with approximately 1/8 mL of  dexamethasone phosphate and a  mild compressive bandage consisting of  Betadine-soaked Adaptic, sterile 4 x 4's and sterile Kling was applied.  The  patient tolerated the procedure well and left the operating room in apparent  good condition with vital signs stable to the recovery room.      DBT/MEDQ  D:  10/25/2004  T:  10/25/2004  Job:  [id number]

## 2010-12-10 NOTE — H&P (Signed)
[facility]  Patient:    Dawn Kirk, Dawn Kirk Visit Number: [id number] MRN: [id number]          Service Type: MED Location: 3A [id number] Attending Physician:  Dessa Phi Dictated by:   Elpidio Anis, M.D. Admit Date:  11/22/2001                           History and Physical  HISTORY OF PRESENT ILLNESS:   A 55 year old female with a history of a burning in her epigastrium and right upper quadrant with onset Saturday.  This became more severe.  She had episodes of nausea with vomiting of clear fluids. She had some sweats with increased burping.  She did not have diarrhea.  The pain radiated through to her back.  The patient was evaluated by Dr. Phillips Odor and Dr. Sherwood Gambler.  She was noted to have gallbladder disease with ultrasound showing multiple gallstones.  She remains symptomatic and is admitted for cholecystectomy and treatment of her acute pain.  PAST MEDICAL HISTORY:  She has hypertension, asthma, and seasonal allergies.  PAST SURGICAL HISTORY:  Bilateral knee arthroscopies, C section, tubal ligation, removal of a cyst of the right foot and removal of ganglion cyst, right wrist.  MEDICATIONS: 1. Univasc 15 mg q.d. 2. Lopressor 50 mg b.i.d. 3. Clarinex one q.d.  SOCIAL HISTORY:  She is married and employed.  She smokes one pack of cigarettes per day.  She does not drink or use drugs.  REVIEW OF SYSTEMS:  Negative except for abdominal pain.  PHYSICAL EXAMINATION:  GENERAL:  She is a healthy, slightly obese-appearing female in mild distress due to abdominal pain.  VITAL SIGNS:  Blood pressure 140/110, pulse 72, respirations 20, weight 230-1/2 pounds.  HEENT:  Unremarkable.  NECK:  Supple without JVD or bruit.  CHEST:  Clear to auscultation.  HEART:  Regular rate and rhythm without murmur, gallop, or rub.  ABDOMEN:  Obese with moderate epigastric and right upper quadrant tenderness. Normal bowel sounds.  No palpable  organomegaly.  EXTREMITIES:  No cyanosis, clubbing, or edema.  NEUROLOGIC:  No focal motor, sensory, or cerebellar deficits.  IMPRESSION: 1. Cholelithiasis with acute cholecystitis. 2. Hypertension. 3. Seasonal allergies. 4. History of asthma.  PLAN:  Patient is admitted.  She will be treated with IV analgesia, antiemetics, and antibiotics.  Will try to have her surgery scheduled for in the morning. Dictated by:   Elpidio Anis, M.D. Attending Physician:  Dessa Phi DD:  11/22/01 TD:  11/22/01 Job: [id number]

## 2010-12-10 NOTE — Discharge Summary (Signed)
NAMECONLEY, PAWLING                ACCOUNT NO.:  [id number]   MEDICAL RECORD NO.:  [id number]          PATIENT TYPE:  INP   LOCATION:  A337                          FACILITY:  [facility]   PHYSICIAN:  Vickki Hearing, M.D.DATE OF BIRTH:  [birth year]   DATE OF ADMISSION:  10/18/2005  DATE OF DISCHARGE:  LH                                 DISCHARGE SUMMARY   ADMITTING DIAGNOSIS:  Osteoarthritis left knee.   DISCHARGE DIAGNOSIS:  Osteoarthritis left knee.   PROCEDURE:  Left total knee replacement with a DePuy Rotating Platform  System.   HISTORY:  She is 55 years old and has severe bone-on-bone varus  osteoarthritis of the left knee and failed all conservative treatment.   On October 18, 2005, the patient came in for a left total knee replacement  that went well.  She tolerated it well, went to the floor, advanced very  quickly in therapy and was stable for discharge on October 21, 2005.   She will be discharged on Lovenox 30 mg b.i.d. subcu for 7 days.  She will  be discharged on Dilaudid 2 mg q.4 h. p.r.n. for pain (#100), K-Dur 20 two  times a day  for a potassium of 3.1 for balance of 60 days.   She will have home health physical therapy three times a week, CPM advanced  10 degrees per day up to 100 degrees.   (Patient range of motion in the operating room 0-105 maybe 110 and further  flexion was limited by the patient's leg size and morphology.)   She should follow up with me in a month.  She has an appointment.  She  should have staples taken out on October 29, 2005.   She should resume all her preoperative medications.   DISPOSITION:  Home.   CONDITION:  Improved.     Vickki Hearing, M.D.  Electronically Signed    SEH/MEDQ  D:  10/21/2005  T:  10/23/2005  Job:  [id number]

## 2010-12-10 NOTE — Group Therapy Note (Signed)
Dawn Kirk, Dawn Kirk                ACCOUNT NO.:  [id number]   MEDICAL RECORD NO.:  [id number]          PATIENT TYPE:  AMB   LOCATION:  DAY                           FACILITY:  [facility]   PHYSICIAN:  Vickki Hearing, M.D.DATE OF BIRTH:  1956-02-15   DATE OF PROCEDURE:  DATE OF DISCHARGE:                                   PROGRESS NOTE   ADDENDUM:  The patient takes Cardizem, hydrochlorothiazide, Effexor and  Wellbutrin.      Vickki Hearing, M.D.  Electronically Signed     SEH/MEDQ  D:  02/23/2006  T:  02/23/2006  Job:  [id number]

## 2010-12-10 NOTE — H&P (Signed)
NAMECARLEENA, Dawn Kirk                ACCOUNT NO.:  [id number]   MEDICAL RECORD NO.:  [id number]          PATIENT TYPE:  AMB   LOCATION:  DAY                           FACILITY:  [facility]   PHYSICIAN:  Oley Balm. Pricilla Holm, D.P.M.DATE OF BIRTH:  1955/07/27   DATE OF ADMISSION:  10/25/2004  DATE OF DISCHARGE:  LH                                HISTORY & PHYSICAL   [name] presented to the office with a chief complaint of pain over the  dorsal aspect of her left foot.  The patient has had a history of ganglion  cyst on the dorsal aspect of her right foot and a metatarsal cuneiform  hyperostosis.  She has been treated with wider shoes, larger shoes,  injection therapy without relief, and now she requests surgical correction  of same.   PREVIOUS HOSPITALIZATION AND SURGERY:  1.  C section.  2.  Right and left knee torn menisci.  3.  Cholecystectomy.  4.  Neck surgery x 2.   MEDICATIONS:  Effexor, hydrochlorothiazide, Xanax, and Diltiazem.   ALLERGIES:  She relates allergies to CODEINE.   FAMILY HISTORY:  Heart disease, cancer.   SOCIAL HISTORY:  She does smoke one pack of cigarettes per day.  She has no  transfusion hepatitis.   REVIEW OF SYSTEMS:  History of asthma, arthritis, hypertension.   PHYSICAL EXAMINATION:  EXTREMITIES:  Lower extremity exam reveals palpable  pedal pulses, DP, and PT with spontaneous capillary filling time.  NEUROLOGIC:  Exam was essentially within normal limits with the exception of  dorsal aspect of patient's right foot where she has paresthesias extending  from the subcutaneous mass on the dorsal aspect of her right foot down to  her toes.  MUSCULOSKELETAL:  The patient has a subcutaneous mass approximately 2 cm in  diameter on the dorsal aspect of her right foot.  Part of it appears to be  very fluctuant; however, the most proximal part appears to be hard and had a  hyperostosis.  The other part appears to be a ganglionic cyst.   The patient was treated  with injection therapy, aspiration, larger shoes,  wider shoes without relief, and now the patient requests surgical removal of  knot on the top of her right foot.   X-rays take reveal the patient does have a mild hyperostosis on the dorsal  aspect of the right foot and debulking that can be felt clinically coincides  with what is seen radiographically.   ASSESSMENT:  Ganglion cyst, right foot, metatarsal cuneiform hyperostosis.   PLAN:  The patient will undergo surgical removal.  I have reviewed the  procedures with the patient which will be excision of ganglionic cyst,  removal of hyperostosis, and decompression of the nerve.  Reviewed the  procedure, possible complications of procedure such as infection, bone  infection, postoperative pain and swelling, etc.  The patient seems to  understand the same, and again, surgery is scheduled for October 25, 2004.      DBT/MEDQ  D:  10/24/2004  T:  10/24/2004  Job:  [id number]

## 2010-12-10 NOTE — Discharge Summary (Signed)
   Dawn Kirk, Dawn Kirk                            ACCOUNT NO.:  [id number]   MEDICAL RECORD NO.:  [id number]                   PATIENT TYPE:   LOCATION:                                       FACILITY:   PHYSICIAN:  Dirk Dress. Katrinka Blazing, M.D.                DATE OF BIRTH:  27-May-1956   DATE OF ADMISSION:  11/22/2001  DATE OF DISCHARGE:  11/24/2001                                 DISCHARGE SUMMARY   DISCHARGE DIAGNOSES:  1. Cholelithiasis with cholecystitis.  2. Hypertension.   SPECIAL PROCEDURE:  Laparoscopic cholecystectomy Nov 23, 2001.   DISPOSITION:  The patient discharged home in stable and satisfactory  condition.   DISCHARGE MEDICATIONS:  1. Univasc 15 mg q.d.  2. Lopressor 50 mg b.i.d.  3. Clarinex one q.d.  4. Demerol 50 mg q.i.d. p.r.n. pain.   FOLLOW UP:  The patient is scheduled to be seen in the office on Dec 03, 2001.   HISTORY OF PRESENT ILLNESS:  The patient is a 55 year old female with  history of a burning sensation in her epigastrium and right upper quadrant  on the Saturday prior to admission.  This became more severe.  She had  episodes of nausea with vomiting.  She had sweats.  Pain radiated through to  her back.  She was evaluated and was found to have gallbladder disease with  ultrasound showing multiple stones.  She remained symptomatic and was  admitted for control of her pain prior to cholecystectomy.   PAST MEDICAL HISTORY:  Hypertension, asthma, and seasonal allergies.   PHYSICAL EXAMINATION:  VITAL SIGNS: Afebrile, blood pressure 140/110, pulse  72, respirations 20, weight 230 pounds.  GENERAL: Her exam is normal except for her abdomen which revealed moderate  epigastric and right upper quadrant tenderness.   HOSPITAL COURSE:  The patient was admitted, treated with IV analgesics,  antiemetics, and antibiotics. She was scheduled for surgery the following  morning.  On Nov 23, 2001, she underwent laparoscopy and had laparoscopic  cholecystectomy  uneventfully.  She did very well in the postoperative  period, she tolerated diet without difficulty, she had minimal incisional  discomfort and was discharged home on the afternoon of Nov 24, 2001 in  satisfactory condition.                                               Dirk Dress. Katrinka Blazing, M.D.    LCS/MEDQ  D:  03/02/2002  T:  03/07/2002  Job:  [id number]

## 2010-12-10 NOTE — Op Note (Signed)
[place]  Patient:    Dawn Kirk, Dawn Kirk Visit Number: [id number] MRN: [id number]          Service Type: DSU Location: [address] [facility] Attending Physician:  Milly Jakob Dictated by:   Harvie Junior, M.D. Proc. Date: 10/24/01 Admit Date:  10/24/2001 Discharge Date: 10/24/2001                             Operative Report  PREOPERATIVE DIAGNOSIS:  Degenerative joint disease with medial meniscal tear.  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 Large loose body.  PROCEDURE:  Removal of large loose body from the intercondylar notch.  Partial medial and partial lateral meniscectomy.  Debridement of medial femoral condyle and debridement of lateral femoral condyle.  Debridement of chondromalacia patella and trochlear groove.  SURGEON:  Harvie Junior, M.D.  ASSISTANT:  Currie Paris. Thedore Mins.  ANESTHESIA:  Knee block.  INDICATIONS:  This is a 55 year old female with a long history of having had a previous knee arthroscopy, but ultimately having significant degenerative joint disease in the knee.  She had an MRI obtained which showed that she had a medial meniscal tear as well as degeneration of the knee and because of continued complaints of pain, she is ultimately brought to the operating room for operative arthroscopy.  DESCRIPTION OF PROCEDURE:  The patient was brought to the operating room after adequate anesthesia obtained with a knee block. The patient was placed on the operating table and the knee was then examined under anesthesia and noted to be stable in all directions.  Attention was then turned to the knee arthroscopy after routine prep and drape which showed that she had significant degenerative changes on the medial femoral condyle as well as the medial tibial plateau, grade II and III in nature.  This was debrided back to a smooth and stable rim.  Attention was turned to the posterior horn  medial meniscus where a tear was identified and this was debrided.  Attention was turned laterally where a large lateral meniscal tear posteriorly was identified and this was debrided.  The lateral femoral condyle had one large area of osteochondral defect which was debrided.  Attention was turned up into the patella where there was some grade II and III changes in the trochlea. This was debrided.  Attention was then to the popliteal space where there was some synovial tissue identified, but no real lesion or loose body could be identified.  We had discussed this preoperatively and she did have some findings on her MRI, but I did not feel that dissection or back into the popliteal space was indicated.  She may need to have a follow-up MRI in about a year just to assess this further.  At this point, the knee was copiously irrigated and suctioned dry.  The portals were closed with a bandage and a sterile and compressive dressing was applied.  The patient was taken to the recovery room where she was noted to be in satisfactory condition.  Estimated blood loss was 100 cc. Dictated by:   Harvie Junior, M.D. Attending Physician:  Milly Jakob DD:  10/24/01 TD:  10/25/01 Job: [id number]

## 2011-01-25 ENCOUNTER — Other Ambulatory Visit: Payer: Self-pay | Admitting: Obstetrics & Gynecology

## 2011-01-25 DIAGNOSIS — Z1231 Encounter for screening mammogram for malignant neoplasm of breast: Secondary | ICD-10-CM

## 2011-02-25 ENCOUNTER — Ambulatory Visit (HOSPITAL_COMMUNITY)
Admission: RE | Admit: 2011-02-25 | Discharge: 2011-02-25 | Disposition: A | Discharge status: Home / Self Care | Payer: 59 | Source: Ambulatory Visit | Attending: Obstetrics & Gynecology | Admitting: Obstetrics & Gynecology

## 2011-02-25 ENCOUNTER — Ambulatory Visit (HOSPITAL_COMMUNITY): Payer: 59

## 2011-02-25 DIAGNOSIS — Z1231 Encounter for screening mammogram for malignant neoplasm of breast: Secondary | ICD-10-CM | POA: Insufficient documentation

## 2011-06-09 ENCOUNTER — Other Ambulatory Visit: Payer: Self-pay | Admitting: Orthopedic Surgery

## 2011-07-06 ENCOUNTER — Encounter (HOSPITAL_BASED_OUTPATIENT_CLINIC_OR_DEPARTMENT_OTHER): Payer: Self-pay

## 2011-07-06 NOTE — Progress Notes (Signed)
Patient to come in for BMET/EKG. Patient had negative sleep study-diagnosed with deviated septum. Cardiac cath done in 2011-negative as per records located with chart ( pt.not followed by cardiologist).Patient instructed to bring medications from home.

## 2011-07-07 ENCOUNTER — Other Ambulatory Visit: Payer: Self-pay

## 2011-07-07 ENCOUNTER — Encounter (HOSPITAL_BASED_OUTPATIENT_CLINIC_OR_DEPARTMENT_OTHER)
Admission: RE | Admit: 2011-07-07 | Discharge: 2011-07-07 | Disposition: A | Discharge status: Home / Self Care | Payer: 59 | Source: Ambulatory Visit | Attending: Orthopedic Surgery | Admitting: Orthopedic Surgery

## 2011-07-07 LAB — BASIC METABOLIC PANEL
BUN: 12 mg/dL (ref 6–23)
Calcium: 9.5 mg/dL (ref 8.4–10.5)
GFR calc non Af Amer: 59 mL/min — ABNORMAL LOW (ref >90)
Glucose, Bld: 102 mg/dL — ABNORMAL HIGH (ref 70–99)
Sodium: 139 mEq/L (ref 135–145)

## 2011-07-07 NOTE — H&P (Signed)
Dawn Kirk is an 55 y.o. female.   Chief Complaint: right wrist mass and left index mass HPI: 55 y/o female with a right wrist mass c/w ganglion and left index mass with pain  Past Medical History  Diagnosis Date  . PONV (postoperative nausea and vomiting)     pt had Phenegan in the past  . Anxiety   . Depression   . Hypertension   . GERD (gastroesophageal reflux disease)   . Arthritis   . Asthma     related to bronchitis-no recent issues    Past Surgical History  Procedure Date  . Cesarean section 1984  . Tubal ligation 1989  . Neck surgery x2-2004  . Cholecystectomy 2003  . Replacement total knee bilateral 2007  . Colonoscopy 2009  . Foot surgery 2006  . Endometrial ablation 2006  . Cardiac catheterization 2011-[facility]    History reviewed. No pertinent family history. Social History:  reports that she has been smoking Cigarettes.  She has a 20 pack-year smoking history. She does not have any smokeless tobacco history on file. She reports that she does not drink alcohol or use illicit drugs.  Allergies:  Allergies  Allergen Reactions  . Codeine     REACTION: itching  . Levofloxacin   . Adhesive (Tape) Itching and Rash    No current facility-administered medications on file as of .   Medications Prior to Admission  Medication Sig Dispense Refill  . esomeprazole (NEXIUM) 20 MG capsule Take 75 mg by mouth daily before breakfast.        . megestrol (MEGACE) 40 MG tablet Take 40 mg by mouth daily.        . potassium chloride 20 MEQ/15ML (10%) solution Take by mouth daily. Takes as needed for "heavy legs"- 2 x per week average       . triamterene-hydrochlorothiazide (DYAZIDE) 37.5-25 MG per capsule Take 1 capsule by mouth every morning.        . venlafaxine (EFFEXOR) 37.5 MG tablet Take 37.5 mg by mouth 1 day or 1 dose. Takes in evening         Results for orders placed during the hospital encounter of 07/08/11 (from the past 48 hour(s))  BASIC METABOLIC PANEL      Status: Abnormal   Collection Time   07/07/11  9:00 AM      Component Value Range Comment   Sodium 139  135 - 145 (mEq/L)    Potassium 3.1 (*) 3.5 - 5.1 (mEq/L)    Chloride 100  96 - 112 (mEq/L)    CO2 25  19 - 32 (mEq/L)    Glucose, Bld 102 (*) 70 - 99 (mg/dL)    BUN 12  6 - 23 (mg/dL)    Creatinine, Ser 7.82  0.50 - 1.10 (mg/dL)    Calcium 9.5  8.4 - 10.5 (mg/dL)    GFR calc non Af Amer 59 (*) >90 (mL/min)    GFR calc Af Amer 68 (*) >90 (mL/min)    No results found.  Review of Systems  Constitutional: Negative.   HENT: Negative.   Eyes: Negative.   Respiratory: Negative.   Cardiovascular: Negative.   Gastrointestinal: Positive for heartburn.  Genitourinary: Negative.   Musculoskeletal: Positive for joint pain.  Skin: Negative.   Neurological: Negative.   Endo/Heme/Allergies: Negative.   Psychiatric/Behavioral: Negative.     Height 5\' 4"  (1.626 m), weight 111.131 kg (245 lb). Physical Exam  Constitutional: She is oriented to person, place, and  time. She appears well-developed and well-nourished.  HENT:  Head: Normocephalic and atraumatic.  Neck: Normal range of motion.  Cardiovascular: Normal rate.   Respiratory: Effort normal.  Musculoskeletal:       Right wrist: She exhibits tenderness and swelling.       Left hand: She exhibits tenderness.  Neurological: She is alert and oriented to person, place, and time.  Skin: Skin is warm.  Psychiatric: She has a normal mood and affect. Her speech is normal and behavior is normal. Thought content normal.     Assessment/Plan55 y/o female with right wrist mass and left index pain/mass. Plan on right wrist arthroscopy with ganglion excision and left index injection  Dawn Kirk A 07/07/2011, 9:58 PM

## 2011-07-08 ENCOUNTER — Encounter (HOSPITAL_BASED_OUTPATIENT_CLINIC_OR_DEPARTMENT_OTHER): Payer: Self-pay | Admitting: Anesthesiology

## 2011-07-08 ENCOUNTER — Ambulatory Visit (HOSPITAL_BASED_OUTPATIENT_CLINIC_OR_DEPARTMENT_OTHER)
Admission: RE | Admit: 2011-07-08 | Discharge: 2011-07-08 | Disposition: A | Discharge status: Home / Self Care | Payer: 59 | Source: Ambulatory Visit | Attending: Orthopedic Surgery | Admitting: Orthopedic Surgery

## 2011-07-08 ENCOUNTER — Encounter (HOSPITAL_BASED_OUTPATIENT_CLINIC_OR_DEPARTMENT_OTHER): Payer: Self-pay | Admitting: *Deleted

## 2011-07-08 ENCOUNTER — Ambulatory Visit (HOSPITAL_BASED_OUTPATIENT_CLINIC_OR_DEPARTMENT_OTHER): Payer: 59 | Admitting: Anesthesiology

## 2011-07-08 ENCOUNTER — Encounter (HOSPITAL_BASED_OUTPATIENT_CLINIC_OR_DEPARTMENT_OTHER)
Admission: RE | Disposition: A | Discharge status: Home / Self Care | Payer: Self-pay | Source: Ambulatory Visit | Attending: Orthopedic Surgery

## 2011-07-08 DIAGNOSIS — R229 Localized swelling, mass and lump, unspecified: Secondary | ICD-10-CM | POA: Insufficient documentation

## 2011-07-08 DIAGNOSIS — K219 Gastro-esophageal reflux disease without esophagitis: Secondary | ICD-10-CM | POA: Insufficient documentation

## 2011-07-08 DIAGNOSIS — J45909 Unspecified asthma, uncomplicated: Secondary | ICD-10-CM | POA: Insufficient documentation

## 2011-07-08 DIAGNOSIS — M25539 Pain in unspecified wrist: Secondary | ICD-10-CM | POA: Insufficient documentation

## 2011-07-08 DIAGNOSIS — M25531 Pain in right wrist: Secondary | ICD-10-CM

## 2011-07-08 DIAGNOSIS — M6740 Ganglion, unspecified site: Secondary | ICD-10-CM | POA: Insufficient documentation

## 2011-07-08 DIAGNOSIS — Z01812 Encounter for preprocedural laboratory examination: Secondary | ICD-10-CM | POA: Insufficient documentation

## 2011-07-08 DIAGNOSIS — I10 Essential (primary) hypertension: Secondary | ICD-10-CM | POA: Insufficient documentation

## 2011-07-08 HISTORY — DX: Gastro-esophageal reflux disease without esophagitis: K21.9

## 2011-07-08 HISTORY — DX: Unspecified osteoarthritis, unspecified site: M19.90

## 2011-07-08 HISTORY — PX: STERIOD INJECTION: SHX5046

## 2011-07-08 HISTORY — DX: Major depressive disorder, single episode, unspecified: F32.9

## 2011-07-08 HISTORY — DX: Depression, unspecified: F32.A

## 2011-07-08 HISTORY — DX: Nausea with vomiting, unspecified: R11.2

## 2011-07-08 HISTORY — DX: Essential (primary) hypertension: I10

## 2011-07-08 HISTORY — DX: Anxiety disorder, unspecified: F41.9

## 2011-07-08 HISTORY — DX: Other specified postprocedural states: Z98.890

## 2011-07-08 HISTORY — PX: WRIST ARTHROSCOPY: SHX838

## 2011-07-08 LAB — POCT HEMOGLOBIN-HEMACUE: Hemoglobin: 15.7 g/dL — ABNORMAL HIGH (ref 12.0–15.0)

## 2011-07-08 SURGERY — ARTHROSCOPY, WRIST
Anesthesia: General | Site: Wrist | Laterality: Right | Wound class: Clean

## 2011-07-08 MED ORDER — BUPIVACAINE HCL (PF) 0.25 % IJ SOLN
INTRAMUSCULAR | Status: DC | PRN
  Administered 2011-07-08: 10 mL via INTRA_ARTICULAR

## 2011-07-08 MED ORDER — MIDAZOLAM HCL 5 MG/5ML IJ SOLN
INTRAMUSCULAR | Status: DC | PRN
  Administered 2011-07-08: 2 mg via INTRAVENOUS

## 2011-07-08 MED ORDER — FENTANYL CITRATE 0.05 MG/ML IJ SOLN
INTRAMUSCULAR | Status: DC | PRN
  Administered 2011-07-08 (×2): 50 ug via INTRAVENOUS

## 2011-07-08 MED ORDER — FENTANYL CITRATE 0.05 MG/ML IJ SOLN: 50.0000 ug | INTRAMUSCULAR | Status: DC | PRN

## 2011-07-08 MED ORDER — MIDAZOLAM HCL 2 MG/2ML IJ SOLN: 1.0000 mg | INTRAMUSCULAR | Status: DC | PRN

## 2011-07-08 MED ORDER — DEXAMETHASONE SODIUM PHOSPHATE 4 MG/ML IJ SOLN
INTRAMUSCULAR | Status: DC | PRN
  Administered 2011-07-08: 10 mg via INTRAVENOUS

## 2011-07-08 MED ORDER — HYDROMORPHONE HCL PF 1 MG/ML IJ SOLN
0.2500 mg | INTRAMUSCULAR | Status: DC | PRN
  Administered 2011-07-08: 0.25 mg via INTRAVENOUS

## 2011-07-08 MED ORDER — PROMETHAZINE HCL 25 MG/ML IJ SOLN: 6.2500 mg | INTRAMUSCULAR | Status: DC | PRN

## 2011-07-08 MED ORDER — LORAZEPAM 2 MG/ML IJ SOLN: 1.0000 mg | Freq: Once | INTRAMUSCULAR | Status: DC | PRN

## 2011-07-08 MED ORDER — BETAMETHASONE SOD PHOS & ACET 6 (3-3) MG/ML IJ SUSP
INTRAMUSCULAR | Status: DC | PRN
  Administered 2011-07-08: 1 mL via INTRA_ARTICULAR

## 2011-07-08 MED ORDER — OXYCODONE-ACETAMINOPHEN 5-325 MG PO TABS: 1.0000 | ORAL_TABLET | ORAL | Status: AC | PRN

## 2011-07-08 MED ORDER — SCOPOLAMINE 1 MG/3DAYS TD PT72
1.0000 | MEDICATED_PATCH | Freq: Once | TRANSDERMAL | Status: DC
  Administered 2011-07-08: 1.5 mg via TRANSDERMAL

## 2011-07-08 MED ORDER — CHLORHEXIDINE GLUCONATE 4 % EX LIQD: 60.0000 mL | Freq: Once | CUTANEOUS | Status: DC

## 2011-07-08 MED ORDER — CEFAZOLIN SODIUM-DEXTROSE 2-3 GM-% IV SOLR
2.0000 g | Freq: Once | INTRAVENOUS | Status: AC
  Administered 2011-07-08: 2 g via INTRAVENOUS

## 2011-07-08 MED ORDER — CEFAZOLIN SODIUM 1-5 GM-% IV SOLN: 1.0000 g | Freq: Once | INTRAVENOUS | Status: DC

## 2011-07-08 MED ORDER — LACTATED RINGERS IV SOLN
INTRAVENOUS | Status: DC
  Administered 2011-07-08 (×2): via INTRAVENOUS

## 2011-07-08 SURGICAL SUPPLY — 69 items
BAG DECANTER FOR FLEXI CONT (MISCELLANEOUS) IMPLANT
BANDAGE ACE 4 STERILE (GAUZE/BANDAGES/DRESSINGS) ×1 IMPLANT
BANDAGE ELASTIC 4 VELCRO ST LF (GAUZE/BANDAGES/DRESSINGS) ×3 IMPLANT
BANDAGE GAUZE ELAST BULKY 4 IN (GAUZE/BANDAGES/DRESSINGS) ×1 IMPLANT
BANDAID FLEXIBLE 1X3 (GAUZE/BANDAGES/DRESSINGS) ×1 IMPLANT
BLADE MINI RND TIP GREEN BEAV (BLADE) IMPLANT
BLADE SURG 15 STRL LF DISP TIS (BLADE) ×2 IMPLANT
BLADE SURG 15 STRL SS (BLADE) ×3
BNDG CMPR 9X4 STRL LF SNTH (GAUZE/BANDAGES/DRESSINGS) ×2
BNDG ESMARK 4X9 LF (GAUZE/BANDAGES/DRESSINGS) ×3 IMPLANT
BUR CUDA 2.9 (BURR) ×1 IMPLANT
BUR FULL RADIUS 2.9 (BURR) IMPLANT
BUR GATOR 2.9 (BURR) IMPLANT
BUR SPHERICAL 2.9 (BURR) IMPLANT
CANISTER SUCTION 1200CC (MISCELLANEOUS) ×1 IMPLANT
CLOTH BEACON ORANGE TIMEOUT ST (SAFETY) ×3 IMPLANT
CORDS BIPOLAR (ELECTRODE) IMPLANT
COVER TABLE BACK 60X90 (DRAPES) ×3 IMPLANT
CUFF TOURNIQUET SINGLE 18IN (TOURNIQUET CUFF) IMPLANT
DECANTER SPIKE VIAL GLASS SM (MISCELLANEOUS) IMPLANT
DRAPE EXTREMITY T 121X128X90 (DRAPE) ×3 IMPLANT
DRAPE SURG 17X23 STRL (DRAPES) ×3 IMPLANT
DURAPREP 26ML APPLICATOR (WOUND CARE) ×5 IMPLANT
GAUZE SPONGE 4X4 12PLY STRL LF (GAUZE/BANDAGES/DRESSINGS) ×1 IMPLANT
GAUZE SPONGE 4X4 16PLY XRAY LF (GAUZE/BANDAGES/DRESSINGS) IMPLANT
GAUZE XEROFORM 1X8 LF (GAUZE/BANDAGES/DRESSINGS) ×1 IMPLANT
GLOVE BIO SURGEON STRL SZ 6.5 (GLOVE) ×1 IMPLANT
GLOVE BIO SURGEON STRL SZ8.5 (GLOVE) ×3 IMPLANT
GOWN PREVENTION PLUS XLARGE (GOWN DISPOSABLE) ×3 IMPLANT
GOWN PREVENTION PLUS XXLARGE (GOWN DISPOSABLE) ×3 IMPLANT
IV NS IRRIG 3000ML ARTHROMATIC (IV SOLUTION) ×3 IMPLANT
NDL EPIDURAL TUOHY 20GX3.5 (NEEDLE) IMPLANT
NDL SAFETY ECLIPSE 18X1.5 (NEEDLE) ×4 IMPLANT
NDL SPNL 25GX3.5 QUINCKE BL (NEEDLE) IMPLANT
NEEDLE HYPO 18GX1.5 SHARP (NEEDLE) ×3
NEEDLE HYPO 22GX1.5 SAFETY (NEEDLE) ×3 IMPLANT
NEEDLE SPNL 25GX3.5 QUINCKE BL (NEEDLE) IMPLANT
NEEDLE TUOHY 20GX3.5 (NEEDLE) IMPLANT
NS IRRIG 1000ML POUR BTL (IV SOLUTION) IMPLANT
PACK BASIN DAY SURGERY FS (CUSTOM PROCEDURE TRAY) ×3 IMPLANT
PAD CAST 4YDX4 CTTN HI CHSV (CAST SUPPLIES) ×2 IMPLANT
PADDING CAST ABS 4INX4YD NS (CAST SUPPLIES)
PADDING CAST ABS COTTON 4X4 ST (CAST SUPPLIES) ×2 IMPLANT
PADDING CAST COTTON 4X4 STRL (CAST SUPPLIES) ×3
SET EXT MALE ROTATING LL 32IN (MISCELLANEOUS) ×3 IMPLANT
SET IV EXT TUBING FEMALE 31 (MISCELLANEOUS) ×2 IMPLANT
SET SM JOINT TUBING/CANN (CANNULA) ×3 IMPLANT
SHEET MEDIUM DRAPE 40X70 STRL (DRAPES) ×3 IMPLANT
SLING ARM FOAM STRAP XLG (SOFTGOODS) IMPLANT
SPLINT PLASTER CAST XFAST 3X15 (CAST SUPPLIES) IMPLANT
SPLINT PLASTER CAST XFAST 4X15 (CAST SUPPLIES) ×10 IMPLANT
SPLINT PLASTER X FASTSET 4X15 (CAST SUPPLIES) ×1 IMPLANT
SPLINT PLASTER XTRA FAST SET 4 (CAST SUPPLIES) ×5
SPLINT PLASTER XTRA FASTSET 3X (CAST SUPPLIES)
SPONGE GAUZE 4X4 12PLY (GAUZE/BANDAGES/DRESSINGS) ×3 IMPLANT
STOCKINETTE 4X48 STRL (DRAPES) ×3 IMPLANT
SUT ETHILON 4 0 PS 2 18 (SUTURE) ×1 IMPLANT
SUT PDS AB 2-0 CT2 27 (SUTURE) IMPLANT
SUT PROLENE 3 0 PS 2 (SUTURE) IMPLANT
SUT VICRYL RAPIDE 4/0 PS 2 (SUTURE) IMPLANT
SYRINGE 10CC LL (SYRINGE) ×3 IMPLANT
TOWEL OR 17X24 6PK STRL BLUE (TOWEL DISPOSABLE) ×3 IMPLANT
TRAP DIGIT (INSTRUMENTS) ×2 IMPLANT
TRAP FINGER LRG (INSTRUMENTS) IMPLANT
TUBE CONNECTING 20X1/4 (TUBING) ×3 IMPLANT
UNDERPAD 30X30 INCONTINENT (UNDERPADS AND DIAPERS) ×3 IMPLANT
WAND 1.5 MICROBLATOR (SURGICAL WAND) IMPLANT
WAND SHORT BEVEL W/CORD (SURGICAL WAND) ×1 IMPLANT
WATER STERILE IRR 1000ML POUR (IV SOLUTION) ×3 IMPLANT

## 2011-07-08 NOTE — Anesthesia Postprocedure Evaluation (Signed)
  Anesthesia Post-op Note  Patient: Dawn Kirk  Procedure(s) Performed:  ARTHROSCOPY WRIST - right wrist with ganglionectomy, ; STEROID INJECTION - injection 1ml celestone left index finger  Patient Location: PACU  Anesthesia Type: GA combined with regional for post-op pain  Level of Consciousness: awake, alert  and oriented  Airway and Oxygen Therapy: Patient Spontanous Breathing  Post-op Pain: mild  Post-op Assessment: Post-op Vital signs reviewed, Patient's Cardiovascular Status Stable, Respiratory Function Stable, RESPIRATORY FUNCTION UNSTABLE, No signs of Nausea or vomiting, Adequate PO intake and Pain level controlled  Post-op Vital Signs: stable  Complications: No apparent anesthesia complications

## 2011-07-08 NOTE — Op Note (Signed)
See dictated note [phone]

## 2011-07-08 NOTE — Anesthesia Procedure Notes (Signed)
Procedure Name: LMA Insertion Date/Time: 07/08/2011 12:17 PM Performed by: Signa Kell Pre-anesthesia Checklist: Patient identified, Emergency Drugs available, Suction available and Patient being monitored Patient Re-evaluated:Patient Re-evaluated prior to inductionOxygen Delivery Method: Circle System Utilized Preoxygenation: Pre-oxygenation with 100% oxygen Intubation Type: IV induction Ventilation: Mask ventilation without difficulty LMA: LMA with gastric port inserted LMA Size: 4.0 Number of attempts: 1 Airway Equipment and Method: bite block Placement Confirmation: positive ETCO2 Tube secured with: Tape Dental Injury: Teeth and Oropharynx as per pre-operative assessment

## 2011-07-08 NOTE — H&P (Signed)
  No changes in exam  Ok to proceed with right wrist a/scope and left index injection

## 2011-07-08 NOTE — Anesthesia Preprocedure Evaluation (Signed)
Anesthesia Evaluation  Patient identified by MRN, date of birth, ID band Patient awake    Reviewed: Allergy & Precautions, H&P , NPO status , Patient's Chart, lab work & pertinent test results  History of Anesthesia Complications (+) PONV  Airway Mallampati: II TM Distance: >3 FB Neck ROM: Full    Dental   Pulmonary asthma ,    Pulmonary exam normal       Cardiovascular hypertension,     Neuro/Psych PSYCHIATRIC DISORDERS  Neuromuscular disease    GI/Hepatic GERD-  ,  Endo/Other  Morbid obesity  Renal/GU      Musculoskeletal   Abdominal (+) obese,   Peds  Hematology   Anesthesia Other Findings   Reproductive/Obstetrics                           Anesthesia Physical Anesthesia Plan  ASA: II  Anesthesia Plan: General   Post-op Pain Management:    Induction: Intravenous  Airway Management Planned: LMA  Additional Equipment:   Intra-op Plan:   Post-operative Plan: Extubation in OR  Informed Consent: I have reviewed the patients History and Physical, chart, labs and discussed the procedure including the risks, benefits and alternatives for the proposed anesthesia with the patient or authorized representative who has indicated his/her understanding and acceptance.     Plan Discussed with: CRNA and Surgeon  Anesthesia Plan Comments:         Anesthesia Quick Evaluation

## 2011-07-08 NOTE — Brief Op Note (Signed)
07/08/2011  12:50 PM  PATIENT:  Dawn Kirk  55 y.o. female  PRE-OPERATIVE DIAGNOSIS:  wrist pain right left index finger cyst  POST-OPERATIVE DIAGNOSIS:  wrist pain right left index finger cyst  PROCEDURE:  Procedure(s): ARTHROSCOPY WRIST STEROID INJECTION  SURGEON:  Surgeon(s): Marlowe Shores, MD  PHYSICIAN ASSISTANT:   ASSISTANTS: none   ANESTHESIA:   general  EBL:  Total I/O In: 100 [I.V.:100] Out: -   BLOOD ADMINISTERED:none  DRAINS: none   LOCAL MEDICATIONS USED:  MARCAINE 5CC  SPECIMEN:  No Specimen  DISPOSITION OF SPECIMEN:  N/A  COUNTS:  YES  TOURNIQUET:   Total Tourniquet Time Documented: Upper Arm (Right) - 24 minutes  DICTATION: .Other Dictation: Dictation Number [id number]  PLAN OF CARE: Discharge to home after PACU  PATIENT DISPOSITION:  PACU - hemodynamically stable.   Delay start of Pharmacological VTE agent (>24hrs) due to surgical blood loss or risk of bleeding:  {YES/NO/NOT APPLICABLE:20182

## 2011-07-08 NOTE — Transfer of Care (Signed)
Immediate Anesthesia Transfer of Care Note  Patient: Dawn Kirk  Procedure(s) Performed:  ARTHROSCOPY WRIST - right wrist with ganglionectomy, ; STEROID INJECTION - injection 1ml celestone left index finger  Patient Location: PACU  Anesthesia Type: General  Level of Consciousness: sedated  Airway & Oxygen Therapy: Patient Spontanous Breathing and Patient connected to face mask oxygen  Post-op Assessment: Report given to PACU RN and Post -op Vital signs reviewed and stable  Post vital signs: Reviewed and stable  Complications: No apparent anesthesia complications

## 2011-07-09 NOTE — Op Note (Signed)
NAMEGREG, ECKRICH                ACCOUNT NO.:  [id number]  MEDICAL RECORD NO.:  [id number]  LOCATION:                                 FACILITY:  PHYSICIAN:  Artist Pais. Viera Okonski, M.D.DATE OF BIRTH:  03/20/1956  DATE OF PROCEDURE:  07/08/2011 DATE OF DISCHARGE:                              OPERATIVE REPORT   PREOPERATIVE DIAGNOSIS:  Right wrist dorsal ganglion with wrist pain and left index finger mass.  POSTOPERATIVE DIAGNOSIS:  Right wrist dorsal ganglion with wrist pain and left index finger mass.  PROCEDURE:  Right wrist arthroscopy, debridement of TFCC, and arthroscopic ganglionectomy as well as injection of left index finger, radial side, metacarpal phalangeal joint.  SURGEON:  Artist Pais. Mina Marble, M.D.  ASSIST:  None.  ANESTHESIA:  General.  TOURNIQUET TIME:  32 minutes.  No complication.  No drains.  DESCRIPTION OF PROCEDURE:  The patient was taken to the operating suite. After induction of adequate general anesthesia, the right upper extremity was prepped and draped in sterile fashion.  An Esmarch was used to exsanguinate the limb.  Tourniquet was inflated to 250 mmHg.  At this point in time, the patient's right upper extremity was padded and placed in wrist traction tower, a 15 pounds countertraction across the radiocarpal joint.  Standard 3-4 arthroscopic portal was established 1 cm distal to Lister's tubercle.  The skin was incised sharply.  Blunt dissection was carried down to the joint.  The scope was introduced into the joint.  Visualization revealed intact radial-sided ligaments. Intact SL ligament was repaired to be a TFCC since showed a radial-sided tear and no other abnormalities noted.  An 18-gauge needle was used to establish a 6U outflow portal followed by 4.5 working portal.  Through the 4.5 working portal, the TFCC tear was debrided down to stable rim using 2.9 suction shaver with 2.3-mm ArthriCare wand.  The scope was then placed in the 4.5  portal and directed towards the dorsal aspect between the scaphoid and lunate.  The previously marked ganglion cyst had downward pressure applied.  Tweet stalk was visualized and transected with a portion of the dorsal capsule until the extensor tendons were identified.  A thorough debridement of the capsule and the stalk was undertaken.  The cyst was deflated.  Visualization of the rest of the joint revealed no other osteocartilaginous lesions.  The scope was introduced and removed from the portals, they were closed with 4-0 nylon.  The intra-articular surface was injected with Celestone 1 mL, the portals with Marcaine, and the patient was placed in the sterile dressing of Xeroform, 4 x 4s, fluffs, and a volar splint.  Prior to the patient waking up, 1 mL of Celestone was injected into the most tender spot on the index finger, radial-sided metacarpal phalangeal joint.  The vein was marked preoperatively.  The patient tolerated all procedures well and went to recovery room in stable fashion.     Artist Pais Mina Marble, M.D.     MAW/MEDQ  D:  07/08/2011  T:  07/09/2011  Job:  [id number]

## 2011-07-11 ENCOUNTER — Encounter (HOSPITAL_BASED_OUTPATIENT_CLINIC_OR_DEPARTMENT_OTHER): Payer: Self-pay | Admitting: Orthopedic Surgery

## 2011-07-14 ENCOUNTER — Encounter (HOSPITAL_BASED_OUTPATIENT_CLINIC_OR_DEPARTMENT_OTHER): Payer: Self-pay

## 2011-07-28 ENCOUNTER — Ambulatory Visit: Payer: 59 | Admitting: Dietician

## 2011-08-02 ENCOUNTER — Encounter: Payer: Self-pay | Admitting: Dietician

## 2011-08-02 ENCOUNTER — Encounter: Payer: 59 | Attending: Adult Health | Admitting: Dietician

## 2011-08-02 DIAGNOSIS — Z713 Dietary counseling and surveillance: Secondary | ICD-10-CM | POA: Insufficient documentation

## 2011-08-02 DIAGNOSIS — J45909 Unspecified asthma, uncomplicated: Secondary | ICD-10-CM | POA: Insufficient documentation

## 2011-08-02 DIAGNOSIS — E119 Type 2 diabetes mellitus without complications: Secondary | ICD-10-CM | POA: Insufficient documentation

## 2011-08-02 DIAGNOSIS — E669 Obesity, unspecified: Secondary | ICD-10-CM | POA: Insufficient documentation

## 2011-08-02 NOTE — Progress Notes (Signed)
  Medical Nutrition Therapy:  Appt start time: 1130 end time:  1300  Assessment:  Primary concerns today: New diagnosis of type 2 diabetes.Marland Kitchen   MEDICATIONS: Diabetes medication is Metformin 500 mg in the AM  DIETARY INTAKE:  24-hr recall:  B (7:00 AM): Beverage tea (decaffinated, sweetened) 8:30-9:00 breakfast biscuit from [employer] (bacon or sausage) with cheese and egg  OR oatmeal (fruit, nuts) 1 cup sweet tea. Snk ( AM) :none  L (12:30 PM):  Sandwich of ww bread with [place], or thin slice rounds. With lettuce, tomato, mustard. With the tea. May use garden salsa sun chips.  Snk ( PM): none D (6:00-7:00 PM): Pizza, 1 and 1/2 slice, Meat and 2 veggies, 1/3 cup potato, cabbage, broccoli, black eye peas, greens.   Snk ( PM): none Beverages: Sweet tea decaffinated, uses milk with cereal only.  Recent physical activity: No planned exercise.  Has had knee issues in the past.  Currently, she is open to beginning a regular planned exercise program.  Starting low and working up time and endurance.   Estimated energy needs: Ht: 64 in.,  WT: 241 lb,  Adjusted WT:159 lb (72 kg)  1400-1500 calories 155-160 g carbohydrates 100-105 g protein 37-39 g fat  Progress Towards Goal(s):  In progress.   Nutritional Diagnosis:  [place]-2.1 Inpaired nutrition utilization As related to glucose and carbohydrate intake.  As evidenced by A1C of 6.8% and random glucose of 176 mg along with a diagnosis of type 2 diabetes.    Intervention:  Nutrition Needs to look to decrease the sugar and concentrated sweets in her diet.  Can benefit with decreasing the fatty foods and the fried foods and decrease use of fast foods.  Needs to continue to monitor carb portions and use the sugar substitutes for making foods/dishes sweet  Handouts given during visit include:  Copy of the Living well with diabetes.  Handout with recommendations for blood glucose levels, lifestyle changes for decreasing blood glucose levels, and  dietary recommendations.  Novo Nordisk Carb Counting Guide,  Yellow card with a diet prescription for 1400 calories  Menu containing meal suggestions for the 3 carb exchanges or 45 gm per meal.  Monitoring/Evaluation:  Dietary intake, exercise, blood glucose levels, and body weight in 4-6 months, to call for an appointment.

## 2011-08-02 NOTE — Patient Instructions (Addendum)
   Continue to walk as much as possible.  Try for 5 minutes at break, 5 minutes at lunch, and 5 minutes in the afternoon.    Consider walking with a buggy at the big box stores for 15-20 minutes on days off.  Get that dilated eye exam.  Keep those dental appointments.  If you have or experienced dehydration, do not take your Metformin  Use the calorieking.com  Try the [place]) or the Splenda  Can try the sweet tea, using the half and half or less approach.   Call for follow-up appointment [phone]  Plan to see Seward Grater sometime in [place] or June.  Call or e-mail with any questions.  Ask Dr. Margo Aye for prescription for strips.  Do glucose before breakfast until you see Dr. Margo Aye, then start to vary times of the day.

## 2011-08-14 ENCOUNTER — Encounter: Payer: Self-pay | Admitting: Dietician

## 2011-09-07 ENCOUNTER — Encounter: Payer: Self-pay | Admitting: *Deleted

## 2011-09-07 ENCOUNTER — Encounter: Payer: 59 | Attending: Adult Health | Admitting: *Deleted

## 2011-09-07 DIAGNOSIS — E669 Obesity, unspecified: Secondary | ICD-10-CM | POA: Insufficient documentation

## 2011-09-07 DIAGNOSIS — J45909 Unspecified asthma, uncomplicated: Secondary | ICD-10-CM | POA: Insufficient documentation

## 2011-09-07 DIAGNOSIS — E119 Type 2 diabetes mellitus without complications: Secondary | ICD-10-CM | POA: Insufficient documentation

## 2011-09-07 DIAGNOSIS — Z713 Dietary counseling and surveillance: Secondary | ICD-10-CM | POA: Insufficient documentation

## 2011-09-07 NOTE — Patient Instructions (Signed)
Patient will attend Core Diabetes Courses II and III as scheduled or follow up prn.  

## 2011-09-07 NOTE — Progress Notes (Signed)
Patient was seen on 09/07/2011 for the first of a series of three diabetes self-management courses at the [facility]. The following learning objectives were met by the patient during this course:   Defines the role of glucose and insulin  Identifies type of diabetes and pathophysiology  Defines the diagnostic criteria for diabetes and prediabetes  States the risk factors for Type 2 Diabetes  States the symptoms of Type 2 Diabetes  Defines Type 2 Diabetes treatment goals  Defines Type 2 Diabetes treatment options  States the rationale for glucose monitoring  Identifies A1C, glucose targets, and testing times  Identifies proper sharps disposal  Defines the purpose of a diabetes food plan  Identifies carbohydrate food groups  Defines effects of carbohydrate foods on glucose levels  Identifies carbohydrate choices/grams/food labels  States benefits of physical activity and effect on glucose  Review of suggested activity guidelines  Last A1c: 7.8%  (08/04/11 per [facility])  Handouts given during class include:  Type 2 Diabetes: Basics Book  My Food Plan Book  Food and Activity Log  Patient has established the following initial goals:  Increase exercise.  Work on stress levels.  Follow a diabetes meal plan.  Stop smoking.  Lose weight.  Follow-Up Plan: Patient will attend Core Diabetes Courses II and III in April 2013.

## 2011-09-08 ENCOUNTER — Encounter: Payer: Self-pay | Admitting: *Deleted

## 2011-09-09 ENCOUNTER — Encounter: Payer: Self-pay | Admitting: *Deleted

## 2011-11-03 ENCOUNTER — Encounter: Payer: 59 | Attending: Adult Health | Admitting: *Deleted

## 2011-11-03 DIAGNOSIS — J45909 Unspecified asthma, uncomplicated: Secondary | ICD-10-CM | POA: Insufficient documentation

## 2011-11-03 DIAGNOSIS — Z713 Dietary counseling and surveillance: Secondary | ICD-10-CM | POA: Insufficient documentation

## 2011-11-03 DIAGNOSIS — E669 Obesity, unspecified: Secondary | ICD-10-CM | POA: Insufficient documentation

## 2011-11-03 DIAGNOSIS — E119 Type 2 diabetes mellitus without complications: Secondary | ICD-10-CM | POA: Insufficient documentation

## 2011-11-04 ENCOUNTER — Encounter: Payer: Self-pay | Admitting: *Deleted

## 2011-11-04 NOTE — Progress Notes (Signed)
  Patient was seen on 11/03/2011 for the second of a series of three diabetes self-management courses at the [facility]. The following learning objectives were met by the patient during this course:   Explain basic nutrition maintenance and quality assurance  Describe causes, symptoms and treatment of hypoglycemia and hyperglycemia  Explain how to manage diabetes during illness  Describe the importance of good nutrition for health and healthy eating strategies  List strategies to follow meal plan when dining out  Describe the effects of alcohol on glucose and how to use it safely  Describe problem solving skills for day-to-day glucose challenges  Describe strategies to use when treatment plan needs to change  Identify important factors involved in successful weight loss  Describe ways to remain physically active  Describe the impact of regular activity on insulin resistance  Handouts given in class:  Refrigerator magnet for Sick Day Guidelines  [facility] Oral medication/insulin handout  Follow-Up Plan: Patient will attend the final class of the ADA Diabetes Self-Care Education.    

## 2011-11-10 ENCOUNTER — Encounter: Payer: 59 | Admitting: *Deleted

## 2011-11-10 ENCOUNTER — Encounter: Payer: Self-pay | Admitting: *Deleted

## 2011-11-10 NOTE — Progress Notes (Signed)
Patient was seen on 11/10/2011 for the third of a series of three diabetes self-management courses at the [facility]. The following learning objectives were met by the patient during this course:    Describe how diabetes changes over time   Identify diabetes complications and ways to prevent them   Describe strategies that can promote heart health including lowering blood pressure and cholesterol   Describe strategies to lower dietary fat and sodium in the diet   Identify physical activities that benefit cardiovascular health   Evaluate success in meeting personal goal   Describe the belief that they can live successfully with diabetes day to day   Establish 2-3 goals that they will plan to diligently work on until they return for the free 16-month follow-up visit  The following handouts were given in class:  3 Month Follow Up Visit handout  Goal setting handout  Class evaluation form  Your patient has established the following 3 month goals for diabetes self-care:  Count carbs at meals and snacks  Be active 30 minutes a day, 3 days a week after foot surgery in May  Stop smoking - on my To Do list, no date set, 1 project at a time  Follow-Up Plan: Patient will attend a 3 month follow-up visit for diabetes self-management education.

## 2011-11-25 ENCOUNTER — Encounter (HOSPITAL_COMMUNITY): Payer: 59

## 2011-11-28 ENCOUNTER — Encounter (HOSPITAL_COMMUNITY): Payer: Self-pay | Admitting: Pharmacy Technician

## 2011-11-29 ENCOUNTER — Encounter (HOSPITAL_COMMUNITY): Payer: Self-pay

## 2011-11-29 ENCOUNTER — Encounter (HOSPITAL_COMMUNITY)
Admission: RE | Admit: 2011-11-29 | Discharge: 2011-11-29 | Disposition: A | Discharge status: Home / Self Care | Payer: 59 | Source: Ambulatory Visit | Attending: Podiatry | Admitting: Podiatry

## 2011-11-29 HISTORY — DX: Polyneuropathy, unspecified: G62.9

## 2011-11-29 LAB — HEMOGLOBIN AND HEMATOCRIT, BLOOD
HCT: 47.6 % — ABNORMAL HIGH (ref 36.0–46.0)
Hemoglobin: 16.2 g/dL — ABNORMAL HIGH (ref 12.0–15.0)

## 2011-11-29 LAB — BASIC METABOLIC PANEL
Calcium: 10 mg/dL (ref 8.4–10.5)
GFR calc Af Amer: 77 mL/min — ABNORMAL LOW (ref >90)
GFR calc non Af Amer: 67 mL/min — ABNORMAL LOW (ref >90)
Glucose, Bld: 94 mg/dL (ref 70–99)
Potassium: 4.2 mEq/L (ref 3.5–5.1)
Sodium: 137 mEq/L (ref 135–145)

## 2011-11-29 NOTE — Progress Notes (Signed)
11/29/11 1256  OBSTRUCTIVE SLEEP APNEA  Have you ever been diagnosed with sleep apnea through a sleep study? No  Do you snore loudly (loud enough to be heard through closed doors)?  1  Do you often feel tired, fatigued, or sleepy during the daytime? 0  Has anyone observed you stop breathing during your sleep? 0  Do you have, or are you being treated for high blood pressure? 1  BMI more than 35 kg/m2? 1  Age over 55 years old? 1  Neck circumference greater than 40 cm/18 inches? 0  Gender: 0  Obstructive Sleep Apnea Score 4   Score 4 or greater  Updated health history

## 2011-11-29 NOTE — Patient Instructions (Signed)
20 Dawn Kirk  11/29/2011   Your procedure is scheduled on:  Thursday, 12/01/11  Report to [facility].  Call this number if you have problems the morning of surgery: [phone]   Remember:   Do not eat food:After Midnight.  May have clear liquids:until Midnight .  Clear liquids include soda, tea, black coffee, apple or grape juice, broth.  Take these medicines the morning of surgery with A SIP OF WATER: nexium   Do not wear jewelry, make-up or nail polish.  Do not wear lotions, powders, or perfumes. You may wear deodorant.  Do not shave 48 hours prior to surgery.  Do not bring valuables to the hospital.  Contacts, dentures or bridgework may not be worn into surgery.  Leave suitcase in the car. After surgery it may be brought to your room.  For patients admitted to the hospital, checkout time is 11:00 AM the day of discharge.   Patients discharged the day of surgery will not be allowed to drive home.  Name and phone number of your driver: driver  Special Instructions: CHG Shower Use Special Wash: 1/2 bottle night before surgery and 1/2 bottle morning of surgery.   Please read over the following fact sheets that you were given: Pain Booklet, MRSA Information, Surgical Site Infection Prevention, Anesthesia Post-op Instructions and Care and Recovery After Surgery   Hammer Toes Hammer toes occur when the joint in one or more of your toes is permanently flexed. CAUSES  This happens when a muscle imbalance or abnormal bone length makes the small toes buckle under. This causes the toe joint to contract. This causes the tendons (cord like structure) to shorten.  SYMPTOMS   When hammer toes are flexible, you can straighten the buckled joint with your hand. Flexible hammer toes may develop into rigid hammer toes over time. Common symptoms of flexible hammer toes include:   Corns (build-ups of skin cells). Corns occur where boney bumps come in frequent contact with hard surfaces. For  example, where your shoes press and rub.   Irritation.   Inflammation.   Pain.   Toe motion is limited.   When a rigid hammer toe is fixed you can no longer straighten the buckled joint. Corns, irritation, pain, and loss of motion is generally worse for rigid hammer toes than for flexible ones.  TREATMENT  The problems noted above if painful or troublesome can be corrected with surgery. This is an elective surgery, so you can pick a convenient time for the procedure. The surgery may:  Improve appearance.   Relieve pain.   Improve function.  You may be asked not to put weight on this foot for a few weeks. There are several types of surgical treatments. Common treatments are listed below. Your surgeon will discuss what is be best for you.   With arthroplasty, a portion of the joint is surgically removed and the toe is straightened. The "gap" fills in with fibroustissue. This helps with pain, deformity and function.   With fusion, cartilage between the two bones is taken out and the bones heal as one longer bone. This helps keep the toe stable and reduces pain but leaves the toe stiff, yet straight.   With implant, a portion of the bone is removed and replaced with an implant to restore motion.   Flexor tendon transfers may be used to release the deforming force which buckles the toe. This is done by the repositioning of the tendons that curl the  toes down (flexor tendons).  Several of these options require fixing the toe with a pin that is visible at the tip of the toe. The pin keeps the toe straight during healing. It is generally removed in the office at 4-8 weeks after the corrective procedure. Generally, removing the pin is not painful.  LET YOUR CAREGIVER KNOW ABOUT:  Allergies.   Medications taken including herbs, eye drops, over the counter medications, and creams.   Use of steroids (by mouth or creams).   Previous problems with anesthetics or novocaine.   Possibility of  pregnancy, if this applies.   History of blood clots (thrombophlebitis).   History of bleeding or blood problems.   Previous surgery.   Other health problems.   Family history of anesthetic problems.  BEFORE THE PROCEDURE You should be present 60 minutes prior to your procedure unless otherwise directed by your caregiver.  RISKS AND COMPLICATIONS  If surgery is recommended, your caregiver will explain your foot problem and how surgery can improve it. Your caregiver can answer questions you may have about potential risks and complications involved.  Let your caregiver know about health changes prior to surgery. It is best to do elective surgeries when you are healthy. Be sure to ask your caregiver how long you will be off your feet and if you need to be off work. Plan accordingly. Your foot and ankle may be immobilized by a cast (from your toes to below your knee). You may be asked not to bear weight on this foot for a few weeks. AFTER THE PROCEDURE You can bear weight as instructed. You may need a bandage, splint, and removable cast boot or surgical shoe for several weeks after surgery. You may resume normal diet and activities as directed. Only take over-the-counter or prescription medicines for pain, discomfort, or fever as directed by your caregiver. SEEK MEDICAL CARE IF:   You have increased bleeding (more than a small spot) from the wound.   You notice redness, swelling, or increasing pain in the wound.   You notice pus coming from the wound or the pin that is used to stabilize the toe.   You notice a bad smell coming from the wound or dressing.  SEEK IMMEDIATE MEDICAL CARE IF:   You have a fever.   You develop a rash.   You have difficulty breathing.   You have any allergic problems.  Document Released: 07/08/2000 Document Revised: 06/30/2011 Document Reviewed: 08/08/2008 [facility] Patient Information 2012 [place], [place].

## 2011-11-30 NOTE — H&P (Signed)
Chief Complaint / History of Present Illness: This 56 year old female returns today for a consultation to discusses the proposed surgery.  She is scheduled for hammertoe repair of the 5th digit of the right foot on 12/01/2011 at [facility].  She discontinued her aspirin last week.    Past Medical History:  Neurological Hx: (+) neuropathy.   Musculoskeletal Hx: (+) arthritis conditions.   Endocrine Hx: (+) diabetes.   GI Hx: (+) acid reflux.   Cardiovascular Hx: (+) hypertension.   Medication History: Active: Nexium (active), Ambien (active), Effexor (active), Metformin (active), potassium (active), alprazolam (active), triamterene (active), venlafaxine (active), megestrol (active).   Allergies: Patient/Guardian admits allergies to codeine resulting in out of body experiences, levaquin resulting in nausea.   Past Surgical History: Patient/Guardian admits past surgical history of c-section, knee replacement (left), knee replacement (right), cholecystectomy, neck surgery, D and C, bone spurs removed, tubal ligation, heart catheterization, wrist surgery, foot surgery, toe surgery.   Social History: Patient/Guardian admits tobacco use. She smokes 1 pack per day, Patient/Guardian denies illegal drug use, Patient/Guardian denies alcohol use.   Family History: Patient/Guardian admits a family history of stroke associated with mother, heart attack associated with father, diabetes associated with father, CHF associated with father, aneurysm of brain associated with mother.   Review of Systems: No abnormal findings with the exception of the chief complaint.    Physical Exam: The patient is a pleasant, 56 year old female in no apparent distress.  She is oriented to person, place and time.  She is ambulatory.  Skin is warm, dry and supple.  Pedal hair growth and distribution is normal.  No hyperkeratotic lesions are present.  No ulceration are present.  Dorsalis pedis and posterior tibial pulses measure 2/4.   Capillary refill time is immediate.  No pedal edema is present.  Muscle tone is normal.  Muscle strength is 5/5 with regards to dorsiflexion, plantarflexion, inversion and eversion.  There is medial deviation with varus rotation of the 5th digit of the right foot.  Light touch sensation is grossly intact.    Test Results:  None to report at this time.    Impression:  Hammertoe deformity.    Plan:   The podiatric pathology and treatment options were again reviewed with the her.  I discussed with the her the surgical procedure itself, the indications, the risks, possible complications, post-operative course and alternative treatments. I gave no guarantees regarding the outcome. She would like to proceed with the proposed surgery.  An informed consent was obtained.  She is to return for her post-operative appointment or sooner if problems arise.  Prescriptions:  She has taken Lortab in the past and tolerated it well. Rx: Phenergan- 12.5 mg tablet , Take 1 tablet by mouth every four to six hours as needed. Max daily dose: 6.  Dispense: 21 tablet. Refills: 1.  Allow Generic: Yes Rx: Lortab- 500 mg-7.5 mg tablet, Take 1 tablet by mouth every four to six hours as needed for pain. Max daily dose: 6.  Dispense: 42 tablet. Refills: 0.  Allow Generic: Yes  cc: Referring Physician:  Emilio Math, DPM  Primary Care Physician:  Catalina Pizza, MD    Signed by _______________________________ Theola Sequin, DPM Digital Signature on 11/29/2011 at 12:13:24 PM by: Theola Sequin, DPM

## 2011-12-01 ENCOUNTER — Encounter (HOSPITAL_COMMUNITY): Payer: Self-pay | Admitting: *Deleted

## 2011-12-01 ENCOUNTER — Encounter (HOSPITAL_COMMUNITY)
Admission: RE | Disposition: A | Discharge status: Home / Self Care | Payer: Self-pay | Source: Ambulatory Visit | Attending: Podiatry

## 2011-12-01 ENCOUNTER — Ambulatory Visit (HOSPITAL_COMMUNITY): Payer: 59 | Admitting: Anesthesiology

## 2011-12-01 ENCOUNTER — Encounter (HOSPITAL_COMMUNITY): Payer: Self-pay | Admitting: Anesthesiology

## 2011-12-01 ENCOUNTER — Ambulatory Visit (HOSPITAL_COMMUNITY): Payer: 59

## 2011-12-01 ENCOUNTER — Ambulatory Visit (HOSPITAL_COMMUNITY)
Admission: RE | Admit: 2011-12-01 | Discharge: 2011-12-01 | Disposition: A | Discharge status: Home / Self Care | Payer: 59 | Source: Ambulatory Visit | Attending: Podiatry | Admitting: Podiatry

## 2011-12-01 DIAGNOSIS — E119 Type 2 diabetes mellitus without complications: Secondary | ICD-10-CM | POA: Insufficient documentation

## 2011-12-01 DIAGNOSIS — Z79899 Other long term (current) drug therapy: Secondary | ICD-10-CM | POA: Insufficient documentation

## 2011-12-01 DIAGNOSIS — I10 Essential (primary) hypertension: Secondary | ICD-10-CM | POA: Insufficient documentation

## 2011-12-01 DIAGNOSIS — M2040 Other hammer toe(s) (acquired), unspecified foot: Secondary | ICD-10-CM | POA: Insufficient documentation

## 2011-12-01 HISTORY — PX: HAMMER TOE SURGERY: SHX385

## 2011-12-01 LAB — SURGICAL PCR SCREEN: MRSA, PCR: POSITIVE — AB

## 2011-12-01 LAB — GLUCOSE, CAPILLARY: Glucose-Capillary: 110 mg/dL — ABNORMAL HIGH (ref 70–99)

## 2011-12-01 SURGERY — CORRECTION, HAMMER TOE
Anesthesia: Monitor Anesthesia Care | Laterality: Right | Wound class: Clean

## 2011-12-01 MED ORDER — MIDAZOLAM HCL 2 MG/2ML IJ SOLN
INTRAMUSCULAR | Status: AC
  Filled 2011-12-01: qty 2

## 2011-12-01 MED ORDER — CEFAZOLIN SODIUM-DEXTROSE 2-3 GM-% IV SOLR: 2.0000 g | INTRAVENOUS | Status: DC

## 2011-12-01 MED ORDER — PROPOFOL 10 MG/ML IV BOLUS
INTRAVENOUS | Status: DC | PRN
  Administered 2011-12-01: 30 mg via INTRAVENOUS

## 2011-12-01 MED ORDER — FENTANYL CITRATE 0.05 MG/ML IJ SOLN
INTRAMUSCULAR | Status: DC | PRN
  Administered 2011-12-01 (×2): 50 ug via INTRAVENOUS

## 2011-12-01 MED ORDER — MUPIROCIN 2 % EX OINT
TOPICAL_OINTMENT | CUTANEOUS | Status: AC
  Filled 2011-12-01: qty 22

## 2011-12-01 MED ORDER — CEFAZOLIN SODIUM 1-5 GM-% IV SOLN
INTRAVENOUS | Status: DC | PRN
  Administered 2011-12-01: 2 g via INTRAVENOUS

## 2011-12-01 MED ORDER — ONDANSETRON HCL 4 MG/2ML IJ SOLN
INTRAMUSCULAR | Status: AC
  Filled 2011-12-01: qty 2

## 2011-12-01 MED ORDER — PROPOFOL 10 MG/ML IV EMUL
INTRAVENOUS | Status: DC | PRN
  Administered 2011-12-01: 75 ug/kg/min via INTRAVENOUS

## 2011-12-01 MED ORDER — ONDANSETRON HCL 4 MG/2ML IJ SOLN: 4.0000 mg | Freq: Once | INTRAMUSCULAR | Status: DC | PRN

## 2011-12-01 MED ORDER — LIDOCAINE HCL (PF) 1 % IJ SOLN
INTRAMUSCULAR | Status: AC
  Filled 2011-12-01: qty 2

## 2011-12-01 MED ORDER — LACTATED RINGERS IV SOLN
INTRAVENOUS | Status: DC
  Administered 2011-12-01: 10:00:00 via INTRAVENOUS

## 2011-12-01 MED ORDER — FENTANYL CITRATE 0.05 MG/ML IJ SOLN: 25.0000 ug | INTRAMUSCULAR | Status: DC | PRN

## 2011-12-01 MED ORDER — ONDANSETRON HCL 4 MG/2ML IJ SOLN
4.0000 mg | Freq: Once | INTRAMUSCULAR | Status: AC
  Administered 2011-12-01: 4 mg via INTRAVENOUS

## 2011-12-01 MED ORDER — MIDAZOLAM HCL 2 MG/2ML IJ SOLN
1.0000 mg | INTRAMUSCULAR | Status: DC | PRN
  Administered 2011-12-01: 2 mg via INTRAVENOUS

## 2011-12-01 MED ORDER — BUPIVACAINE HCL (PF) 0.5 % IJ SOLN
INTRAMUSCULAR | Status: DC | PRN
  Administered 2011-12-01: 10 mL

## 2011-12-01 MED ORDER — FENTANYL CITRATE 0.05 MG/ML IJ SOLN
INTRAMUSCULAR | Status: AC
  Filled 2011-12-01: qty 2

## 2011-12-01 MED ORDER — 0.9 % SODIUM CHLORIDE (POUR BTL) OPTIME
TOPICAL | Status: DC | PRN
  Administered 2011-12-01: 1000 mL

## 2011-12-01 MED ORDER — MIDAZOLAM HCL 5 MG/5ML IJ SOLN
INTRAMUSCULAR | Status: DC | PRN
  Administered 2011-12-01: 2 mg via INTRAVENOUS

## 2011-12-01 MED ORDER — BUPIVACAINE HCL (PF) 0.5 % IJ SOLN
INTRAMUSCULAR | Status: AC
  Filled 2011-12-01: qty 30

## 2011-12-01 MED ORDER — CEFAZOLIN SODIUM-DEXTROSE 2-3 GM-% IV SOLR
INTRAVENOUS | Status: AC
  Filled 2011-12-01: qty 50

## 2011-12-01 SURGICAL SUPPLY — 53 items
BAG HAMPER (MISCELLANEOUS) ×2 IMPLANT
BANDAGE CONFORM 2  STR LF (GAUZE/BANDAGES/DRESSINGS) ×1 IMPLANT
BANDAGE ELASTIC 4 VELCRO NS (GAUZE/BANDAGES/DRESSINGS) ×1 IMPLANT
BANDAGE ESMARK 4X12 BL STRL LF (DISPOSABLE) ×1 IMPLANT
BANDAGE GAUZE ELAST BULKY 4 IN (GAUZE/BANDAGES/DRESSINGS) ×2 IMPLANT
BLADE OSC/SAG 11.5X5.5X.38 (BLADE) ×1 IMPLANT
BLADE OSC/SAG 18.5X9 THN (BLADE) ×1 IMPLANT
BLADE OSC/SAGITTAL MD 5.5X18 (BLADE) ×1 IMPLANT
BLADE SURG 15 STRL LF DISP TIS (BLADE) ×1 IMPLANT
BLADE SURG 15 STRL SS (BLADE) ×2
BNDG CMPR 12X4 ELC STRL LF (DISPOSABLE) ×1
BNDG ESMARK 4X12 BLUE STRL LF (DISPOSABLE) ×2
CHLORAPREP W/TINT 26ML (MISCELLANEOUS) ×2 IMPLANT
CLOTH BEACON ORANGE TIMEOUT ST (SAFETY) ×2 IMPLANT
COVER LIGHT HANDLE STERIS (MISCELLANEOUS) ×4 IMPLANT
CUFF TOURNIQUET SINGLE 18IN (TOURNIQUET CUFF) ×1 IMPLANT
DECANTER SPIKE VIAL GLASS SM (MISCELLANEOUS) ×2 IMPLANT
DRAPE OEC MINIVIEW 54X84 (DRAPES) ×2 IMPLANT
DRSG ADAPTIC 3X8 NADH LF (GAUZE/BANDAGES/DRESSINGS) ×2 IMPLANT
DURA STEPPER LG (CAST SUPPLIES) IMPLANT
DURA STEPPER MED (CAST SUPPLIES) IMPLANT
DURA STEPPER SML (CAST SUPPLIES) IMPLANT
DURA STEPPER XL (SOFTGOODS) IMPLANT
ELECT REM PT RETURN 9FT ADLT (ELECTROSURGICAL) ×2
ELECTRODE REM PT RTRN 9FT ADLT (ELECTROSURGICAL) ×1 IMPLANT
GLOVE BIO SURGEON STRL SZ7.5 (GLOVE) ×2 IMPLANT
GLOVE BIOGEL PI IND STRL 7.0 (GLOVE) IMPLANT
GLOVE BIOGEL PI INDICATOR 7.0 (GLOVE) ×2
GLOVE ECLIPSE 6.5 STRL STRAW (GLOVE) ×1 IMPLANT
GLOVE ECLIPSE 7.0 STRL STRAW (GLOVE) ×1 IMPLANT
GLOVE SS BIOGEL STRL SZ 6.5 (GLOVE) IMPLANT
GLOVE SUPERSENSE BIOGEL SZ 6.5 (GLOVE) ×1
GOWN STRL REIN XL XLG (GOWN DISPOSABLE) ×5 IMPLANT
K-WIRE 6 (WIRE)
KIT ROOM TURNOVER APOR (KITS) ×2 IMPLANT
KWIRE 6 (WIRE) IMPLANT
MANIFOLD NEPTUNE II (INSTRUMENTS) ×2 IMPLANT
NDL HYPO 27GX1-1/4 (NEEDLE) ×3 IMPLANT
NEEDLE HYPO 27GX1-1/4 (NEEDLE) ×4 IMPLANT
NS IRRIG 1000ML POUR BTL (IV SOLUTION) ×2 IMPLANT
PACK BASIC LIMB (CUSTOM PROCEDURE TRAY) ×2 IMPLANT
PAD ARMBOARD 7.5X6 YLW CONV (MISCELLANEOUS) ×2 IMPLANT
PIN CAPS ORTHO GREEN .062 (PIN) IMPLANT
RASP SM TEAR CROSS CUT (RASP) ×1 IMPLANT
SET BASIN LINEN APH (SET/KITS/TRAYS/PACK) ×2 IMPLANT
SPONGE GAUZE 4X4 12PLY (GAUZE/BANDAGES/DRESSINGS) ×1 IMPLANT
STRIP CLOSURE SKIN 1/8X3 (GAUZE/BANDAGES/DRESSINGS) ×3 IMPLANT
SUT MON AB 5-0 PS2 18 (SUTURE) ×1 IMPLANT
SUT PROLENE 4 0 PS 2 18 (SUTURE) ×1 IMPLANT
SUT VIC AB 4-0 PS2 27 (SUTURE) ×2 IMPLANT
SUT VICRYL AB 3-0 FS1 BRD 27IN (SUTURE) ×1 IMPLANT
SYR CONTROL 10ML LL (SYRINGE) ×4 IMPLANT
TOWEL OR 17X26 4PK STRL BLUE (TOWEL DISPOSABLE) ×2 IMPLANT

## 2011-12-01 NOTE — Anesthesia Preprocedure Evaluation (Signed)
Anesthesia Evaluation  Patient identified by MRN, date of birth, ID band Patient awake    Reviewed: Allergy & Precautions, H&P , NPO status , Patient's Chart, lab work & pertinent test results  History of Anesthesia Complications (+) PONV  Airway Mallampati: II TM Distance: >3 FB Neck ROM: Full    Dental  (+) Teeth Intact and Partial Upper   Pulmonary asthma ,    Pulmonary exam normal       Cardiovascular hypertension, Rhythm:Regular Rate:Normal     Neuro/Psych Anxiety Depression  Neuromuscular disease    GI/Hepatic GERD-  ,  Endo/Other  Diabetes mellitus-, Well Controlled, Type 2, Oral Hypoglycemic AgentsMorbid obesity  Renal/GU      Musculoskeletal   Abdominal (+) + obese,   Peds  Hematology   Anesthesia Other Findings   Reproductive/Obstetrics                           Anesthesia Physical Anesthesia Plan  ASA: II  Anesthesia Plan: MAC   Post-op Pain Management:    Induction: Intravenous  Airway Management Planned: Nasal Cannula  Additional Equipment:   Intra-op Plan:   Post-operative Plan:   Informed Consent: I have reviewed the patients History and Physical, chart, labs and discussed the procedure including the risks, benefits and alternatives for the proposed anesthesia with the patient or authorized representative who has indicated his/her understanding and acceptance.     Plan Discussed with:   Anesthesia Plan Comments:         Anesthesia Quick Evaluation

## 2011-12-01 NOTE — Anesthesia Postprocedure Evaluation (Signed)
  Anesthesia Post-op Note  Patient: Dawn Kirk  Procedure(s) Performed: Procedure(s) (LRB): HAMMER TOE CORRECTION (Right)  Patient Location: PACU  Anesthesia Type: MAC  Level of Consciousness: awake, alert  and oriented  Airway and Oxygen Therapy: Patient Spontanous Breathing  Post-op Pain: none  Post-op Assessment: Post-op Vital signs reviewed, Patient's Cardiovascular Status Stable, Respiratory Function Stable, Patent Airway and No signs of Nausea or vomiting  Post-op Vital Signs: Reviewed and stable  Complications: No apparent anesthesia complications

## 2011-12-01 NOTE — Brief Op Note (Signed)
OPERATIVE REPORT  SURGEON:   Dallas Schimke, DPM  OR STAFF:   Caprice Kluver, RN - Circulator Hurshel Party, CST - Scrub Person Lizabeth Leyden, RN - RN First Assistant Lennox Pippins, RN - Careers adviser   PREOPERATIVE DIAGNOSIS:   1. Hammertoe deformity 5th digit right foot  POSTOPERATIVE DIAGNOSIS: Same  PROCEDURE: 1. Derotational arthroplasty of the PIPJ 5th digit right foot   ANESTHESIA:  Monitor Anesthesia Care   HEMOSTASIS:   Pneumatic Ankle Tourniquet set at 250 mmHg  ESTIMATED BLOOD LOSS:   Minimal (<5 cc)  MATERIALS USED:  None   INJECTABLES: Marcaine 0.5% plain; 10mL  PATHOLOGY:   None  COMPLICATIONS:   None  INDICATIONS:  Painful hammertoe deformity 5th digit right foot  DICTATION:  Other Dictation: Dictation Number [id number]

## 2011-12-01 NOTE — H&P (Signed)
HISTORY AND PHYSICAL INTERVAL NOTE:  12/01/2011  9:25 AM  Dawn Kirk  has presented today for surgery, with the diagnosis of hammer toe repair 5th digit right foot.  The various methods of treatment have been discussed with the patient.  No guarantees were given.  After consideration of risks, benefits and other options for treatment, the patient has consented to surgery.  I have reviewed the patients' chart and labs.    Patient Vitals for the past 24 hrs:  BP Temp Temp src Pulse Resp SpO2  12/01/11 0851 130/85 mmHg 97.8 F (36.6 C) Oral 78  20  98 %    A history and physical examination was performed in my office on 11/29/2011.  The patient was reexamined.  There have been no changes to this history and physical examination.  Dallas Schimke, DPM

## 2011-12-01 NOTE — Progress Notes (Signed)
Rt foot xrays 3 views done.

## 2011-12-01 NOTE — Transfer of Care (Signed)
Immediate Anesthesia Transfer of Care Note  Patient: Dawn Kirk  Procedure(s) Performed: Procedure(s) (LRB): HAMMER TOE CORRECTION (Right)  Patient Location: PACU  Anesthesia Type: MAC  Level of Consciousness: awake, alert  and oriented  Airway & Oxygen Therapy: Patient Spontanous Breathing  Post-op Assessment: Report given to PACU RN  Post vital signs: Reviewed and stable  Complications: No apparent anesthesia complications

## 2011-12-01 NOTE — Progress Notes (Signed)
Dr Nolen Mu notified of 110 FSBS. No new orders given.

## 2011-12-02 NOTE — Op Note (Signed)
NAME:  Dawn Kirk, Dawn Kirk                ACCOUNT NO.:  [id number]  MEDICAL RECORD NO.:  [id number]  LOCATION:  [facility]                          FACILITY:  [facility]  PHYSICIAN:  B. Theola Sequin, MD   DATE OF BIRTH:  14-Mar-1956  DATE OF PROCEDURE:  12/01/2011 DATE OF DISCHARGE:  12/01/2011                              OPERATIVE REPORT   SURGEON:  B. Theola Sequin, MD  ASSISTANT:  [place].  PREOPERATIVE DIAGNOSIS:  Hammertoe deformity, 5th digit, right foot.  POSTOPERATIVE DIAGNOSIS:  Hammertoe deformity, 5th digit, right foot.  PROCEDURE:  Derotational arthroplasty of the proximal interphalangeal joint, 5th digit, right foot.  ANESTHESIA:  MAC with local.  HEMOSTASIS:  Pneumatic ankle tourniquet at 250 mmHg.  ESTIMATED BLOOD LOSS:  Minimal (less than 5 mL).  INJECTABLE:  0.5% Marcaine plain.  PATHOLOGY:  None.  COMPLICATIONS:  None.  PROCEDURE IN DETAIL:  The patient was brought to the operating room, placed on the operative table in supine position.  The pneumatic ankle tourniquet was placed about the patient's right ankle.  The foot was anesthetized using 0.5% Marcaine plain.  The foot was scrubbed, prepped, and draped in the usual sterile manner.  The limb was then elevated, exsanguinated, and the pneumatic ankle tourniquet inflated to 250 mmHg.  Attention was directed to the 5th toe of the right foot where 2 converging semielliptical incisions were made overlying the proximal interphalangeal joint.  The incision was placed from a proximal lateral to distal medial direction.  The wedge of skin was removed and passed from the operative field.  A transverse tenotomy and capsulotomy was then performed.  The periosteal capsular structures were reflected proximally thus allowing exposure of the residual head of the proximal phalanx of the 5th toe.  Using a power bone saw, the head of the proximal phalanx was resected and passed from the operative field.  The wound was irrigated  with copious amounts of sterile irrigant.  The extensor tendon was reapproximated using 4-0 Vicryl.  The toe was then derotated and the skin reapproximated using 4-0 Prolene in horizontal mattress and simple suture technique.  A sterile compressive dressing was then applied to the operative foot.  The pneumatic ankle tourniquet deflated and a prompt hyperemic response was noted to all digits of the operative foot.          ______________________________ B. Theola Sequin, MD     BIM/MEDQ  D:  12/01/2011  T:  12/02/2011  Job:  [id number]

## 2011-12-05 ENCOUNTER — Encounter (HOSPITAL_COMMUNITY): Payer: Self-pay | Admitting: Podiatry

## 2012-02-01 ENCOUNTER — Other Ambulatory Visit: Payer: Self-pay | Admitting: Obstetrics & Gynecology

## 2012-02-01 DIAGNOSIS — Z1231 Encounter for screening mammogram for malignant neoplasm of breast: Secondary | ICD-10-CM

## 2012-02-09 ENCOUNTER — Encounter: Payer: Self-pay | Admitting: *Deleted

## 2012-02-09 ENCOUNTER — Encounter: Payer: 59 | Attending: Internal Medicine | Admitting: *Deleted

## 2012-02-09 VITALS — Ht 64.0 in | Wt 240.9 lb

## 2012-02-09 DIAGNOSIS — E119 Type 2 diabetes mellitus without complications: Secondary | ICD-10-CM | POA: Insufficient documentation

## 2012-02-09 DIAGNOSIS — Z713 Dietary counseling and surveillance: Secondary | ICD-10-CM | POA: Insufficient documentation

## 2012-02-09 NOTE — Patient Instructions (Addendum)
Goals:  Follow Diabetes Meal Plan as instructed  Eat 3 meals and 2 snacks, every 3-5 hrs  Limit carbohydrate intake to 30 grams carbohydrate/meal  Limit carbohydrate intake to 0-15 grams carbohydrate/snack  Add lean protein foods to meals/snacks  Monitor glucose levels as instructed by your doctor  Aim for 15-30 mins of physical activity daily  Bring food record and glucose log to your next nutrition visit 

## 2012-02-09 NOTE — Progress Notes (Signed)
  Patient was seen on 02/09/2012 for their 3 month follow-up as a part of the diabetes self-management courses at the [facility]. The following learning objectives were met by your patient during this course:  Patient self reports the following:  Diabetes control has improved since diabetes self-management training: YES Number of days blood glucose is >200: never Last MD appointment for diabetes: before first class in March, 2013 Changes in treatment plan: carb intake is better Confidence with ability to manage diabetes: improved Areas for improvement with diabetes self-care: doing well with current meal plan and checking BG's Willingness to participate in diabetes support group: no  Please see Diabetes Flow sheet for findings related to patient's self-care.  Follow-Up Plan: Patient is eligible for a "free" 30 minute diabetes self-care appointment in the next year. Patient to call and schedule as needed.

## 2012-02-21 ENCOUNTER — Ambulatory Visit (HOSPITAL_COMMUNITY): Payer: 59

## 2012-02-28 ENCOUNTER — Ambulatory Visit (HOSPITAL_COMMUNITY)
Admission: RE | Admit: 2012-02-28 | Discharge: 2012-02-28 | Disposition: A | Discharge status: Home / Self Care | Payer: 59 | Source: Ambulatory Visit | Attending: Obstetrics & Gynecology | Admitting: Obstetrics & Gynecology

## 2012-02-28 DIAGNOSIS — Z1231 Encounter for screening mammogram for malignant neoplasm of breast: Secondary | ICD-10-CM | POA: Insufficient documentation

## 2012-03-05 ENCOUNTER — Other Ambulatory Visit: Payer: Self-pay | Admitting: Obstetrics & Gynecology

## 2012-03-05 DIAGNOSIS — R928 Other abnormal and inconclusive findings on diagnostic imaging of breast: Secondary | ICD-10-CM

## 2012-03-12 ENCOUNTER — Other Ambulatory Visit (HOSPITAL_COMMUNITY)
Admission: RE | Admit: 2012-03-12 | Discharge: 2012-03-12 | Disposition: A | Discharge status: Home / Self Care | Payer: 59 | Source: Ambulatory Visit | Attending: Obstetrics and Gynecology | Admitting: Obstetrics and Gynecology

## 2012-03-12 ENCOUNTER — Other Ambulatory Visit: Payer: Self-pay | Admitting: Adult Health

## 2012-03-12 DIAGNOSIS — Z1151 Encounter for screening for human papillomavirus (HPV): Secondary | ICD-10-CM | POA: Insufficient documentation

## 2012-03-12 DIAGNOSIS — Z01419 Encounter for gynecological examination (general) (routine) without abnormal findings: Secondary | ICD-10-CM | POA: Insufficient documentation

## 2012-03-13 ENCOUNTER — Ambulatory Visit
Admission: RE | Admit: 2012-03-13 | Discharge: 2012-03-13 | Disposition: A | Discharge status: Home / Self Care | Payer: 59 | Source: Ambulatory Visit | Attending: Obstetrics & Gynecology | Admitting: Obstetrics & Gynecology

## 2012-03-13 DIAGNOSIS — R928 Other abnormal and inconclusive findings on diagnostic imaging of breast: Secondary | ICD-10-CM

## 2012-04-16 ENCOUNTER — Other Ambulatory Visit (HOSPITAL_COMMUNITY): Payer: Self-pay | Admitting: Internal Medicine

## 2012-04-16 ENCOUNTER — Ambulatory Visit (HOSPITAL_COMMUNITY)
Admission: RE | Admit: 2012-04-16 | Discharge: 2012-04-16 | Disposition: A | Discharge status: Home / Self Care | Payer: 59 | Source: Ambulatory Visit | Attending: Internal Medicine | Admitting: Internal Medicine

## 2012-04-16 DIAGNOSIS — R0602 Shortness of breath: Secondary | ICD-10-CM | POA: Insufficient documentation

## 2012-07-12 ENCOUNTER — Ambulatory Visit (INDEPENDENT_AMBULATORY_CARE_PROVIDER_SITE_OTHER): Payer: 59 | Admitting: Otolaryngology

## 2012-07-12 DIAGNOSIS — J343 Hypertrophy of nasal turbinates: Secondary | ICD-10-CM

## 2012-07-12 DIAGNOSIS — J342 Deviated nasal septum: Secondary | ICD-10-CM

## 2012-09-08 ENCOUNTER — Other Ambulatory Visit: Payer: Self-pay

## 2013-03-06 ENCOUNTER — Ambulatory Visit (INDEPENDENT_AMBULATORY_CARE_PROVIDER_SITE_OTHER): Payer: 59 | Admitting: Family Medicine

## 2013-03-06 VITALS — BP 102/67 | HR 74 | Wt 249.0 lb

## 2013-03-06 DIAGNOSIS — E119 Type 2 diabetes mellitus without complications: Secondary | ICD-10-CM

## 2013-03-07 NOTE — Progress Notes (Signed)
Patient presents for 3 month follow up DM as part of the employee sponsored Link to [employer]. Medications and glucose readings have been reviewed. I have also discussed with patient lifestyle interventions such as diet and exercise. Full documentation of this visit can be found in the [employer] documenting system through [employer] [facility]). However, specific areas of concern from this visit include the following:  1.) POC A1C 5.9 at goal on metformin. Patient is not participating in exercise. Counseled on increasing physical activity. She will check into purchasing an elliptical. 2..) download myfitnesspal onto phone and track calories. wt loss of 1 lb per week generally recommended 3.) Hyperlipidemia: Other intolerant to pravastatin and another statin that she can't remember. Currently on zetia. I don't have recent cholesterol results but she needs to be on a statin bc she is a diabetic btwn the age of 61-75. Told her to ask her doctor what other she was intolerant to and we would try a third one to see if she can tolerate it. I'm not a fan of the zetia. will fax MD recommendation to try a different one and will provide her A1C results.   Patient has set a series of personal goals and will follow up in 3 months for further review of DM.

## 2013-04-11 NOTE — Progress Notes (Signed)
Patient ID: Dawn Kirk, female   DOB: 1955/10/27, 57 y.o.   MRN: [id number] ATTENDING PHYSICIAN NOTE: I have reviewed the chart and agree with the plan as detailed above. Denny Levy MD Pager [phone]

## 2013-05-30 ENCOUNTER — Other Ambulatory Visit: Payer: Self-pay

## 2013-05-30 ENCOUNTER — Other Ambulatory Visit: Payer: Self-pay | Admitting: Obstetrics & Gynecology

## 2013-05-30 DIAGNOSIS — Z1231 Encounter for screening mammogram for malignant neoplasm of breast: Secondary | ICD-10-CM

## 2013-06-14 ENCOUNTER — Ambulatory Visit (INDEPENDENT_AMBULATORY_CARE_PROVIDER_SITE_OTHER): Payer: Self-pay | Admitting: Family Medicine

## 2013-06-14 VITALS — BP 120/83 | HR 77 | Wt 258.0 lb

## 2013-06-14 DIAGNOSIS — E119 Type 2 diabetes mellitus without complications: Secondary | ICD-10-CM

## 2013-06-14 NOTE — Progress Notes (Signed)
Patient presents for 3 mo f/u DM as part of the employee sponsored Link to [employer]. Medications have been reviewed. I have also discussed with patient lifestyle interventions such as diet and exercise. Full documentation of this visit can be found in the [employer] documenting system through [employer] [facility]). However specifics from this visit include the following:  Diabetes Mellitus: POC A1C 6.3, at goal on metformin. Patient unable to exercise due to back injury. No recommended medication changes. plan 1.) 10 lb wt loss in 3 mo 2.) patients test BS bid. Told her there was no point since she's only on metformin and her A1C is at goal. Will decrease testing to 2-3x/week 3.) patient's job location is moving to [employer]. I will have to set her up with a new care manager before next visit and call her with appt date and time.  Hyperlipidemia: recently started crestor after MD stopped zetia per my recs. She is tolerating the crestor fine.  Hypertension:at goal on triamterene/hctz   Patient has set a series of personal goals and will follow up in 3 mo for further review of DM

## 2013-06-18 ENCOUNTER — Ambulatory Visit (HOSPITAL_COMMUNITY)
Admission: RE | Admit: 2013-06-18 | Discharge: 2013-06-18 | Disposition: A | Discharge status: Home / Self Care | Payer: 59 | Source: Ambulatory Visit | Attending: Obstetrics & Gynecology | Admitting: Obstetrics & Gynecology

## 2013-06-18 DIAGNOSIS — Z1231 Encounter for screening mammogram for malignant neoplasm of breast: Secondary | ICD-10-CM | POA: Insufficient documentation

## 2013-08-05 NOTE — Progress Notes (Signed)
Patient ID: Dawn CopasJanet B Coats, female   DOB: 21-Mar-1956, 58 y.o.   MRN: [id number] ATTENDING PHYSICIAN NOTE: I have reviewed the chart and agree with the plan as detailed above. Denny LevySara Etty Isaac MD Pager [phone]

## 2013-09-30 ENCOUNTER — Ambulatory Visit (INDEPENDENT_AMBULATORY_CARE_PROVIDER_SITE_OTHER): Payer: Self-pay | Admitting: Family Medicine

## 2013-09-30 VITALS — BP 116/84 | HR 73 | Wt 252.0 lb

## 2013-09-30 DIAGNOSIS — E119 Type 2 diabetes mellitus without complications: Secondary | ICD-10-CM

## 2013-09-30 NOTE — Progress Notes (Signed)
Patient presents for 3 mo f/u DM as part of the employee sponsored [employer] to [employer]. Medications have been reviewed. I have also discussed with patient lifestyle interventions such as diet and exercise. Full documentation of this visit can be found in the [employer] documenting system through [facility] However specifics from this visit include the following:  Diabetes Mellitus: POC A1C 6.4, at goal on metformin. No recommended changes. Patient has lost 6 lb since last visit.  plan 1.) 10 lb wt loss by next visit 2.) try to exercise 10-15 mins per day starting out  3.) f/u 3 mo Hyperlipidemia:  on moderate intensity statin, no recommended changes  Hypertension: at goal on triamterene/hctz  Patient has set a series of personal goals and will f/u in 3 mo for further review of DM

## 2013-10-18 ENCOUNTER — Encounter (HOSPITAL_COMMUNITY): Payer: Self-pay | Admitting: Emergency Medicine

## 2013-10-18 ENCOUNTER — Emergency Department (INDEPENDENT_AMBULATORY_CARE_PROVIDER_SITE_OTHER): Payer: 59

## 2013-10-18 ENCOUNTER — Emergency Department (HOSPITAL_COMMUNITY)
Admission: EM | Admit: 2013-10-18 | Discharge: 2013-10-18 | Disposition: A | Discharge status: Home / Self Care | Payer: 59 | Source: Home / Self Care | Attending: Emergency Medicine | Admitting: Emergency Medicine

## 2013-10-18 DIAGNOSIS — J45909 Unspecified asthma, uncomplicated: Secondary | ICD-10-CM

## 2013-10-18 MED ORDER — PREDNISONE 20 MG PO TABS: 20.0000 mg | ORAL_TABLET | Freq: Two times a day (BID) | ORAL | Status: DC

## 2013-10-18 MED ORDER — AZITHROMYCIN 250 MG PO TABS: ORAL_TABLET | ORAL | Status: DC

## 2013-10-18 MED ORDER — IPRATROPIUM-ALBUTEROL 0.5-2.5 (3) MG/3ML IN SOLN
3.0000 mL | Freq: Once | RESPIRATORY_TRACT | Status: AC
  Administered 2013-10-18: 3 mL via RESPIRATORY_TRACT

## 2013-10-18 MED ORDER — BENZONATATE 200 MG PO CAPS: 200.0000 mg | ORAL_CAPSULE | Freq: Three times a day (TID) | ORAL | Status: DC | PRN

## 2013-10-18 MED ORDER — ALBUTEROL SULFATE HFA 108 (90 BASE) MCG/ACT IN AERS: 2.0000 | INHALATION_SPRAY | Freq: Four times a day (QID) | RESPIRATORY_TRACT | Status: DC

## 2013-10-18 MED ORDER — IPRATROPIUM-ALBUTEROL 0.5-2.5 (3) MG/3ML IN SOLN
RESPIRATORY_TRACT | Status: AC
  Filled 2013-10-18: qty 3

## 2013-10-18 NOTE — Discharge Instructions (Signed)
Most upper respiratory infections are caused by viruses and do not require antibiotics.  We try to save the antibiotics for when we really need them to prevent bacteria from developing resistance to them.  Here are a few hints about things that can be done at home to help get over an upper respiratory infection quicker:  Get extra sleep and extra fluids.  Get 7 to 9 hours of sleep per night and 6 to 8 glasses of water a day.  Getting extra sleep keeps the immune system from getting run down.  Most people with an upper respiratory infection are a little dehydrated.  The extra fluids also keep the secretions liquified and easier to deal with.  Also, get extra vitamin C.  4000 mg per day is the recommended dose. For the aches, headache, and fever, acetaminophen or ibuprofen are helpful.  These can be alternated every 4 hours.  People with liver disease should avoid large amounts of acetaminophen, and people with ulcer disease, gastroesophageal reflux, gastritis, congestive heart failure, chronic kidney disease, coronary artery disease and the elderly should avoid ibuprofen. For nasal congestion try Mucinex-D, or if you're having lots of sneezing or clear nasal drainage use Zyrtec-D. People with high blood pressure can take these if their blood pressure is controlled, if not, it's best to avoid the forms with a "D" (decongestants).  You can use the plain Mucinex, Allegra, Claritin, or Zyrtec even if your blood pressure is not controlled.   A Saline nasal spray such as Ocean Spray can also help.  You can add a decongestant sprays such as Afrin, but you should not use the decongestant sprays for more than 3 or 4 days since they can be habituating.  Breathe Rite nasal strips can also offer a non-drug alternative treatment to nasal congestion, especially at night. For people with symptoms of sinusitis, sleeping with your head elevated can be helpful.  For sinus pain, moist, hot compresses to the face may provide some  relief.  Many people find that inhaling steam as in a shower or from a pot of steaming water can help. For any viral infection, zinc containing lozenges such as Cold-Eze or Zicam are helpful.  Zinc helps to fight viral infection.  Hot salt water gargles (8 oz of hot water, 1/2 tsp of table salt, and a pinch of baking soda) can give relief as well as hot beverages such as hot tea.  Sucrets extra strength lozenges will help the sore throat.  For the cough, take Delsym 2 tsp every 12 hours.  It has also been found recently that Aleve can help control a cough.  The dose is 1 to 2 tablets twice daily with food.  This can be combined with Delsym. (Note, if you are taking ibuprofen, you should not take Aleve as well--take one or the other.) A cool mist vaporizer will help keep your mucous membranes from drying out.   It's important when you have an upper respiratory infection not to pass the infection to others.  This involves being very careful about the following:  Frequent hand washing or use of hand sanitizer, especially after coughing, sneezing, blowing your nose or touching your face, nose or eyes. Do not shake hands or touch anyone and try to avoid touching surfaces that other people use such as doorknobs, shopping carts, telephones and computer keyboards. Use tissues and dispose of them properly in a garbage can or ziplock bag. Cough into your sleeve. Do not let others eat or  drink after you.  It's also important to recognize the signs of serious illness and get evaluated if they occur: Any respiratory infection that lasts more than 7 to 10 days.  Yellow nasal drainage and sputum are not reliable indicators of a bacterial infection, but if they last for more than 1 week, see your doctor. Fever and sore throat can indicate strep. Fever and cough can indicate influenza or pneumonia. Any kind of severe symptom such as difficulty breathing, intractable vomiting, or severe pain should prompt you to see  a doctor as soon as possible.   Your body's immune system is really the thing that will get rid of this infection.  Your immune system is comprised of 2 types of specialized cells called T cells and B cells.  T cells coordinate the array of cells in your body that engulf invading bacteria or viruses while B cells orchestrate the production of antibodies that neutralize infection.  Anything we do or any medications we give you, will just strengthen your immune system or help it clear up the infection quicker.  Here are a few helpful hints to improve your immune system to help overcome this illness or to prevent future infections:  A few vitamins can improve the health of your immune system.  That's why your diet should include plenty of fruits, vegetables, fish, nuts, and whole grains.  Vitamin A and bet-carotene can increase the cells that fight infections (T cells and B cells).  Vitamin A is abundant in dark greens and orange vegetables such as spinach, greens, sweet potatoes, and carrots.  Vitamin B6 contributes to the maturation of white blood cells, the cells that fight disease.  Foods with vitamin B6 include cold cereal and bananas.  Vitamin C is credited with preventing colds because it increases white blood cells and also prevents cellular damage.  Citrus fruits, peaches and green and red bell peppers are all hight in vitamin C.  Vitamin E is an anti-oxidant that encourages the production of natural killer cells which reject foreign invaders and B cells that produce antibodies.  Foods high in vitamin E include wheat germ, nuts and seeds.  Foods high in omega-3 fatty acids found in foods like salmon, tuna and mackerel boost your immune system and help cells to engulf and absorb germs.  Probiotics are good bacteria that increase your T cells.  These can be found in yogurt and are available in supplements such as Culturelle or Align.  Moderate exercise increases the strength of your immune  system and your ability to recover from illness.  I suggest 3 to 5 moderate intensity 30 minute workouts per week.    Sleep is another component of maintaining a strong immune system.  It enables your body to recuperate from the day's activities, stress and work.  My recommendation is to get between 7 and 9 hours of sleep per night.  If you smoke, try to quit completely or at least cut down.  Drink alcohol only in moderation if at all.  No more than 2 drinks daily for men or 1 for women.  Get a flu vaccine early in the fall or if you have not gotten one yet, once this illness has run its course.  If you are over 65, a smoker, or an asthmatic, get a pneumococcal vaccine.  My final recommendation is to maintain a healthy weight.  Excess weight can impair the immune system by interfering with the way the immune system deals with invading viruses or  bacteria.  How to Quit Smoking  According to the U.S. Surgeon General, about 440,000 people in the [place] alone die from complications related to tobacco use.  More deaths occur due to cigarette smoking than illegal drug use, AIDS, car accidents, alcohol-related deaths, suicide and homicide combined.  Smoking accounts for about 30% of all cancer related deaths, including more than 80% of lung cancer deaths. Smoking has also been linked as the cause of many other diseases like heart disease, bronchitis, emphysema, stroke, and complications of pneumonia as well as causing an increased risk of miscarriage, premature births, stillbirth, infant death, and low birth weight in infants.  For this reason, the U.[place]. Surgeon General recommends:  "Smoking cessation (stopping smoking) represents the single most important step that smokers can take to enhance the length and quality of their lives."  Why is it so hard to Quit?  Tobacco products contain Nicotine which is highly addictive - probably as addictive as heroin or cocaine.  Over time, your body becomes  both physically and psychologically dependent on it. Finally, attempts to quit smoking are complicated by withdrawal reactions like depression, irritability, trouble sleeping, trouble concentrating, restlessness, headaches, weight gain, and excessive fatigue as well as a lack of support.  These symptoms can last from a few days to several weeks.  Why should you Quit?  Live longer and healthier  Can improves the health of your housemates (children, spouse)  Increases your energy and breathing ability  Lowers risk of heart attack, stroke and cancer  Saves money - For example, if you smoke a pack of cigarettes a day and each pack costs about $3.00, then you will save about $1,100.00 per year, about $5,500. in 5 years and $11,000.00 in 10 years.  What you can do: 1. Talk to your health care provider - There are many smoking cessations aids available, both prescription or over-the-counter.  Check with your doctor and pharmacist before taking any of these products to see which one is best for you.  Develop a plan with your healthcare provider which may include nicotine replacement, prescription medication and/or counseling.  2. Get Started - [place] a start date on your calendar.  Remove cigarettes and ashtrays from your home, car, and office.  Dont be around other smokers.  Stop smokingnot even a puff!  3. Support - Talk to family, friends, and co-workers about your plan to stop smoking.  Ask them not to smoke around you.  4. Coping Strategies  The four As to help during tough times:  a. Avoid - Avoid other smokers or places where smoking is commonplace.  Avoid alcoholic beverages as these may increase your desire to smoke b. Alter - Change your routine.  Drink water/juices instead of alcohol or coffee.  Take a walk or visit with someone during your coffee break.  Change your route to work. c. Alternatives - Substitute raw vegetables like carrot sticks or celery, sugarless candy or gum for the  habit of having a cigarette. d. Activities - Start an exercise program (talk to your doctor prior to beginning any exercise program). Try out a new hands on hobby to distract you from smoking and to keep your hands busy like woodworking or needlepoint.   Additional tips for specific withdrawal symptoms:   Cravings for tobacco:   Distract yourself        Deep-breathing exercises        Remember that cravings are brief    Irritability:    Take a  few slow, deep breaths       Soak in a hot bath    Insomnia:    Take a walk several hours before bedtime       Avoid caffeinated beverages after noon       Read a book       Take a warm bath       Banana or warm milk    Increased appetite:   Drink water or low-calorie drinks       Make a survival Kit: Include straws, cinnamon        sticks,        coffee stirrers, licorice, toothpicks, gum, or fresh         vegetables    Inability to concentrate:  Take a brisk walk       Lighten your schedule for a couple of days       Take more breaks   Fatigue:    Get a good nights sleep       Take a nap       Dont overdo it for 2-4 weeks    Constipation, gas,  stomach pain:    Drink plenty of fluids       Increase fiber: fruit, raw vegetables, whole grain        cereals       Talk to your doctor about diet changes    5. Dealing with Relapses - Most relapses occur within the first 2-3 months.  This is common so dont be discouraged.  Some people may take several attempts before they can quit smoking completely.  6. Reward yourself - Set-up a rewards program for every milestone, like 1st month after quitting, 3rd month after quitting, and 6th month after quitting to keep you motivated.  What your doctor can do: Perform a physical exam and order diagnostic tests like laboratory blood work and a chest X-ray.  This will help to identify health related conditions that might benefit from smoking cessation. Review your health  history to make sure there are no contraindications with specific smoking cessation aids like allergies to medications or ingredients in these medications or conflicts with your current medications. Prescribe smoking cessation aids such as: Over-the-counter aid:  Nicotine gum, Nicotine patch Prescription aids:  Nicotine spray, Nicotine inhaler, or Bupropion SR (non-nicotine). Offer or recommend individual or group counseling to support you during the initial quitting and maintenance phase of your smoking cessation program. Offer or recommend other alternative treatments like hypnosis or acupuncture.  What you can expect: Benefits from quitting smoking:  Improved Physical Appearance - Minimizes or stops premature wrinkling of skin, bad breath, stained teeth, gum disease, clothes/hair smoke odors, and yellow fingernails  Improved Daily Activities - Food tastes better, sense of smell improves, and decreases shortness of breath during ordinary activities like climbing stairs, walking, and performing light housework  Decreased Financial Cost - From both no longer purchasing cigarettes and the health care cost for medical treatment of conditions caused by smoking.  Health of Others - Decreases risk of exposing others to the effects of second hand smoke.  Sets an example for youth. Benefits of Quitting according to research from the U.S. Surgeon General:  20 minutes after:  Blood pressure lowers & Body temperature normalizes  8 hours after: Carbon monoxide levels begins to normalize   24 hours after: Heart attack risk decreases  2 weeks to 3 months after:  Blood flow improves & lung function increases   1  to 9 months after:  Coughing, sinus congestion, fatigue, shortness of breath decrease   1 year after: Risk of developing coronary heart disease is half that of a smoker  5 years after: Risk of a stroke decreases to that of a nonsmoker  10 years after: Risk of death due to lung cancer  death is halved.  Risk of oral, throat, esophagus, bladder, kidney, and pancreatic cancer decreases.  15 years after: Risk of developing coronary heart disease is half that of a nonsmoker      References:  Here are references that can provide additional information and support:  [place] (800) ACS-2345       (800) Q2878766 or (800) 517-791-7164 www.cancer.org       www.amhrt.Pharmacist, community [employer] 820-147-0367 or 5592306614  (800(276)803-2080 or 949-388-6720 www.lungusa.org       www.medicalacupuncture.[place]     Office on [place] for Disease Control and Prevention (800) 4-CANCER or (800) Y5278638  (256) 288-3755 www.cancer.gov       VoipPolicy.ch  Nicotine Anonymous      Smokefree.gov 864 316 3549) TRY-NICA 540-213-2263)   (807) 463-6061) 44U-QUIT (458)586-1952) www.nicotine-anonymous.org   www.smokefree.gov  Contact your doctor or pharmacist if you have specific questions about starting a smoking cessation program.

## 2013-10-18 NOTE — ED Provider Notes (Signed)
Chief Complaint   Chief Complaint  Patient presents with  . Wheezing    History of Present Illness   Dawn CopasJanet B Kirk is a 58 year old female who's had a one-week history of wheezing, cough productive of small amounts of sputum, nasal congestion, sore throat, aching in her ribs, dry heaves, chills, and sweats. She has had no sick exposures. She has a two-year history of asthma diagnosed by Dr. Carlena SaxFosco and refill. She uses an inhaler for this on an as-needed basis. She's never had pulmonary function testing. She had walking pneumonia years ago.  Review of Systems   Other than as noted above, the patient denies any of the following symptoms: Systemic:  No fevers, chills, sweats, or myalgias. Eye:  No redness or discharge. ENT:  No ear pain, headache, nasal congestion, drainage, sinus pressure, or sore throat. Neck:  No neck pain, stiffness, or swollen glands. Lungs:  No cough, sputum production, hemoptysis, wheezing, chest tightness, shortness of breath or chest pain. GI:  No abdominal pain, nausea, vomiting or diarrhea.  PMFSH   Past medical history, family history, social history, meds, and allergies were reviewed. She is allergic to codeine and Levaquin. She has diabetes, gastroesophageal reflux, depression, hypertension, and elevated cholesterol. Current meds include aspirin, Nexium, metformin, potassium chloride, Crestor, Vistaril, Dyazide, and Brintellix.  Physical exam   Vital signs:  BP 123/57  Pulse 74  Temp(Src) 98.1 F (36.7 C) (Oral)  Resp 24  SpO2 100% General:  Alert and oriented.  In no distress.  Skin warm and dry. Eye:  No conjunctival injection or drainage. Lids were normal. ENT:  TMs and canals were normal, without erythema or inflammation.  Nasal mucosa was clear and uncongested, without drainage.  Mucous membranes were moist.  Pharynx was clear with no exudate or drainage.  There were no oral ulcerations or lesions. Neck:  Supple, no adenopathy, tenderness or  mass. Lungs:  No respiratory distress.  Lungs were clear to auscultation, without wheezes, rales or rhonchi.  Breath sounds were clear and equal bilaterally.  Heart:  Regular rhythm, without gallops, murmers or rubs. Skin:  Clear, warm, and dry, without rash or lesions.  Radiology   Dg Chest 2 View  10/18/2013   CLINICAL DATA:  Shortness of breath and sick for 1 week.  EXAM: CHEST  2 VIEW  COMPARISON:  None.  FINDINGS: Lungs are clear without airspace disease. Heart and mediastinum are within normal limits. Postsurgical changes in the lower cervical spine. No evidence for edema or pleural effusions.  IMPRESSION: No acute cardiopulmonary disease.   Electronically Signed   By: Richarda OverlieAdam  Henn M.D.   On: 10/18/2013 18:55   [facility]   She was given a DuoNeb breathing treatment and afterwards felt better. Her lungs remain clear both before and after the breathing treatment.  Assessment     The encounter diagnosis was Asthmatic bronchitis.  Plan    1.  Meds:  The following meds were prescribed:   New Prescriptions   ALBUTEROL (PROVENTIL HFA;VENTOLIN HFA) 108 (90 BASE) MCG/ACT INHALER    Inhale 2 puffs into the lungs 4 (four) times daily.   AZITHROMYCIN (ZITHROMAX Z-PAK) 250 MG TABLET    Take as directed.   BENZONATATE (TESSALON) 200 MG CAPSULE    Take 1 capsule (200 mg total) by mouth 3 (three) times daily as needed for cough.   PREDNISONE (DELTASONE) 20 MG TABLET    Take 1 tablet (20 mg total) by mouth 2 (two) times daily.  2.  Patient Education/Counseling:  The patient was given appropriate handouts, self care instructions, and instructed in symptomatic relief.  Instructed to get extra fluids, rest, and use a cool mist vaporizer.    3.  Follow up:  The patient was told to follow up here if no better in 3 to 4 days, or sooner if becoming worse in any way, and given some red flag symptoms such as increasing fever, difficulty breathing, chest pain, or persistent vomiting which  would prompt immediate return.  Follow up here as needed.      Reuben Likes, MD 10/18/13 [phone]

## 2013-10-18 NOTE — ED Notes (Signed)
Patient reports uri for about a week, including wheezing.  Wheezing increases with activity.  Reports pain across upper back, pain worsens with inspiration.

## 2013-12-24 ENCOUNTER — Other Ambulatory Visit (HOSPITAL_COMMUNITY): Payer: Self-pay | Admitting: Neurosurgery

## 2013-12-24 DIAGNOSIS — M545 Low back pain, unspecified: Secondary | ICD-10-CM

## 2013-12-24 DIAGNOSIS — M5416 Radiculopathy, lumbar region: Secondary | ICD-10-CM

## 2014-01-06 ENCOUNTER — Ambulatory Visit (HOSPITAL_COMMUNITY)
Admission: RE | Admit: 2014-01-06 | Discharge: 2014-01-06 | Disposition: A | Discharge status: Home / Self Care | Payer: 59 | Source: Ambulatory Visit | Attending: Neurosurgery | Admitting: Neurosurgery

## 2014-01-06 DIAGNOSIS — M5137 Other intervertebral disc degeneration, lumbosacral region: Secondary | ICD-10-CM | POA: Insufficient documentation

## 2014-01-06 DIAGNOSIS — M545 Low back pain, unspecified: Secondary | ICD-10-CM | POA: Insufficient documentation

## 2014-01-06 DIAGNOSIS — M5126 Other intervertebral disc displacement, lumbar region: Secondary | ICD-10-CM | POA: Insufficient documentation

## 2014-01-06 DIAGNOSIS — Q762 Congenital spondylolisthesis: Secondary | ICD-10-CM | POA: Insufficient documentation

## 2014-01-06 DIAGNOSIS — M48061 Spinal stenosis, lumbar region without neurogenic claudication: Secondary | ICD-10-CM | POA: Insufficient documentation

## 2014-01-06 DIAGNOSIS — M51379 Other intervertebral disc degeneration, lumbosacral region without mention of lumbar back pain or lower extremity pain: Secondary | ICD-10-CM | POA: Insufficient documentation

## 2014-01-06 DIAGNOSIS — M5416 Radiculopathy, lumbar region: Secondary | ICD-10-CM

## 2014-01-08 ENCOUNTER — Other Ambulatory Visit: Payer: Self-pay | Admitting: Neurosurgery

## 2014-01-13 ENCOUNTER — Encounter (HOSPITAL_COMMUNITY): Payer: Self-pay | Admitting: Emergency Medicine

## 2014-01-13 ENCOUNTER — Emergency Department (HOSPITAL_COMMUNITY)
Admission: EM | Admit: 2014-01-13 | Discharge: 2014-01-13 | Disposition: A | Discharge status: Home / Self Care | Payer: 59 | Source: Home / Self Care | Attending: Family Medicine | Admitting: Family Medicine

## 2014-01-13 DIAGNOSIS — J40 Bronchitis, not specified as acute or chronic: Secondary | ICD-10-CM

## 2014-01-13 LAB — POCT RAPID STREP A: Streptococcus, Group A Screen (Direct): NEGATIVE

## 2014-01-13 MED ORDER — PREDNISONE 20 MG PO TABS: 20.0000 mg | ORAL_TABLET | Freq: Two times a day (BID) | ORAL | Status: DC

## 2014-01-13 MED ORDER — AZITHROMYCIN 250 MG PO TABS: ORAL_TABLET | ORAL | Status: DC

## 2014-01-13 MED ORDER — BENZONATATE 200 MG PO CAPS: 200.0000 mg | ORAL_CAPSULE | Freq: Three times a day (TID) | ORAL | Status: DC | PRN

## 2014-01-13 NOTE — Discharge Instructions (Signed)
Bronchitis  Bronchitis is inflammation of the airways that extend from the windpipe into the lungs (bronchi). The inflammation often causes mucus to develop, which leads to a cough. If the inflammation becomes severe, it may cause shortness of breath.  CAUSES   Bronchitis may be caused by:    Viral infections.    Bacteria.    Cigarette smoke.    Allergens, pollutants, and other irritants.   SIGNS AND SYMPTOMS   The most common symptom of bronchitis is a frequent cough that produces mucus. Other symptoms include:   Fever.    Body aches.    Chest congestion.    Chills.    Shortness of breath.    Sore throat.   DIAGNOSIS   Bronchitis is usually diagnosed through a medical history and physical exam. Tests, such as chest X-rays, are sometimes done to rule out other conditions.   TREATMENT   You may need to avoid contact with whatever caused the problem (smoking, for example). Medicines are sometimes needed. These may include:   Antibiotics. These may be prescribed if the condition is caused by bacteria.   Cough suppressants. These may be prescribed for relief of cough symptoms.    Inhaled medicines. These may be prescribed to help open your airways and make it easier for you to breathe.    Steroid medicines. These may be prescribed for those with recurrent (chronic) bronchitis.  HOME CARE INSTRUCTIONS   Get plenty of rest.    Drink enough fluids to keep your urine clear or pale yellow (unless you have a medical condition that requires fluid restriction). Increasing fluids may help thin your secretions and will prevent dehydration.    Only take over-the-counter or prescription medicines as directed by your health care provider.   Only take antibiotics as directed. Make sure you finish them even if you start to feel better.   Avoid secondhand smoke, irritating chemicals, and strong fumes. These will make bronchitis worse. If you are a smoker, quit smoking. Consider using nicotine gum or  skin patches to help control withdrawal symptoms. Quitting smoking will help your lungs heal faster.    Put a cool-mist humidifier in your bedroom at night to moisten the air. This may help loosen mucus. Change the water in the humidifier daily. You can also run the hot water in your shower and sit in the bathroom with the door closed for 5-10 minutes.    Follow up with your health care provider as directed.    Wash your hands frequently to avoid catching bronchitis again or spreading an infection to others.   SEEK MEDICAL CARE IF:  Your symptoms do not improve after 1 week of treatment.   SEEK IMMEDIATE MEDICAL CARE IF:   Your fever increases.   You have chills.    You have chest pain.    You have worsening shortness of breath.    You have bloody sputum.   You faint.   You have lightheadedness.   You have a severe headache.    You vomit repeatedly.  MAKE SURE YOU:    Understand these instructions.   Will watch your condition.   Will get help right away if you are not doing well or get worse.  Document Released: 07/11/2005 Document Revised: 05/01/2013 Document Reviewed: 03/05/2013  Exit[employer] Patient Information 2015 [employer]. This information is not intended to replace advice given to you by your health care provider. Make sure you discuss any questions you have with your health care   provider.

## 2014-01-13 NOTE — ED Provider Notes (Signed)
CSN: [id number]     Arrival date & time 01/13/14  1713 History   First MD Initiated Contact with Patient 01/13/14 1841     Chief Complaint  Patient presents with  . Cough  . Sore Throat   (Consider location/radiation/quality/duration/timing/severity/associated sxs/prior Treatment) Patient is a 58 y.o. female presenting with cough and pharyngitis. The history is provided by the patient. No language interpreter was used.  Cough Cough characteristics:  Productive Sputum characteristics:  Nondescript Severity:  Moderate Onset quality:  Gradual Duration:  4 days Timing:  Constant Progression:  Worsening Chronicity:  New Context: not sick contacts   Relieved by:  Nothing Worsened by:  Nothing tried Ineffective treatments:  None tried Associated symptoms: sore throat   Sore Throat  Pt also complains of a bad sore throat.   Pt has had bronchitis in the past and feels the same  Past Medical History  Diagnosis Date  . Anxiety   . Depression   . Hypertension   . GERD (gastroesophageal reflux disease)   . Arthritis   . Asthma     related to bronchitis-no recent issues  . PONV (postoperative nausea and vomiting)   . Diabetes mellitus   . Peripheral neuropathy    Past Surgical History  Procedure Laterality Date  . Cesarean section  1984  . Tubal ligation  1989  . Neck surgery  x2-2004  . Cholecystectomy  2003  . Replacement total knee bilateral  2007    [facility], Dr. Romeo AppleHarrison  . Colonoscopy  2009  . Foot surgery  2006  . Endometrial ablation  2006  . Wrist arthroscopy  07/08/2011    Procedure: ARTHROSCOPY WRIST;  Surgeon: Marlowe ShoresMatthew A Weingold, MD;  Location: [facility];  Service: Orthopedics;  Laterality: Right;  right wrist with ganglionectomy,   . Steriod injection  07/08/2011    Procedure: STEROID INJECTION;  Surgeon: Marlowe ShoresMatthew A Weingold, MD;  Location: [facility];  Service: Orthopedics;  Laterality: Left;  injection 1ml celestone left index finger   . Cardiac catheterization  2011-  . Hammer toe surgery  12/01/2011    Procedure: HAMMER TOE CORRECTION;  Surgeon: Dallas SchimkeBenjamin Ivan McKinney, DPM;  Location: [facility];  Service: Orthopedics;  Laterality: Right;  Arthroplasty 5th Digit Right Foot   Family History  Problem Relation Age of Onset  . Asthma Other   . COPD Other   . Hypertension Other   . Stroke Other   . Heart disease Other    History  Substance Use Topics  . Smoking status: Current Every Day Smoker -- 0.50 packs/day for 40 years    Types: Cigarettes  . Smokeless tobacco: Never Used  . Alcohol Use: No   OB History   Grav Para Term Preterm Abortions TAB SAB Ect Mult Living                 Review of Systems  HENT: Positive for sore throat.   Respiratory: Positive for cough.   All other systems reviewed and are negative.   Allergies  Codeine; Levofloxacin; and Adhesive  Home Medications   Prior to Admission medications   Medication Sig Start Date End Date Taking? Authorizing Provider  albuterol (PROVENTIL HFA;VENTOLIN HFA) 108 (90 BASE) MCG/ACT inhaler Inhale 2 puffs into the lungs 4 (four) times daily. 10/18/13   Reuben Likesavid C Keller, MD  aspirin EC 81 MG tablet Take 81 mg by mouth every morning.    Historical Provider, MD  azithromycin (ZITHROMAX Z-PAK) 250 MG tablet  Take as directed. 10/18/13   Reuben Likesavid C Keller, MD  benzonatate (TESSALON) 200 MG capsule Take 1 capsule (200 mg total) by mouth 3 (three) times daily as needed for cough. 10/18/13   Reuben Likesavid C Keller, MD  esomeprazole (NEXIUM) 40 MG capsule Take 40 mg by mouth daily before breakfast.    Historical Provider, MD  metFORMIN (GLUCOPHAGE) 500 MG tablet Take 500 mg by mouth 2 (two) times daily with a meal. Take 1 tablet by mouth every morning and 2 in the evening (1500 mg total)    Historical Provider, MD  potassium chloride SA (K-DUR,KLOR-CON) 20 MEQ tablet Take 20 mEq by mouth daily as needed. For cramping    Historical Provider, MD  predniSONE (DELTASONE) 20 MG  tablet Take 1 tablet (20 mg total) by mouth 2 (two) times daily. 10/18/13   Reuben Likesavid C Keller, MD  rosuvastatin (CRESTOR) 5 MG tablet Take 5 mg by mouth daily.    Historical Provider, MD  traZODone (DESYREL) 50 MG tablet Take 50 mg by mouth at bedtime.    Historical Provider, MD  triamterene-hydrochlorothiazide (DYAZIDE) 37.5-25 MG per capsule Take 1 capsule by mouth every morning.      Historical Provider, MD  Vortioxetine HBr (BRINTELLIX) 10 MG TABS Take 1 tablet by mouth daily.    Historical Provider, MD   There were no vitals taken for this visit. Physical Exam  Nursing note and vitals reviewed. Constitutional: She is oriented to person, place, and time. She appears well-developed and well-nourished.  HENT:  Head: Normocephalic.  Erythema throat  Eyes: EOM are normal.  Neck: Normal range of motion.  Cardiovascular: Normal rate and normal heart sounds.   Pulmonary/Chest: Effort normal and breath sounds normal.  Abdominal: Soft. She exhibits no distension.  Musculoskeletal: Normal range of motion.  Neurological: She is alert and oriented to person, place, and time.  Skin: Skin is warm.  Psychiatric: She has a normal mood and affect.    ED Course  Procedures (including critical care time) Labs Review Labs Reviewed - No data to display  Imaging Review No results found.   MDM   1. Bronchitis    rx for prednisone,zithromax and tessalon.   Pt advised to see her MD for recheck in 3-4 days   Elson AreasLeslie K Sofia, PA-C 01/13/14 2015

## 2014-01-13 NOTE — ED Notes (Signed)
C/o  Nonproductive cough and sore throat since Friday.  Denies fever, n/v/d.  No relief with otc meds.

## 2014-01-14 NOTE — ED Provider Notes (Signed)
Medical screening examination/treatment/procedure(s) were performed by resident physician or non-physician practitioner and as supervising physician I was immediately available for consultation/collaboration.   KINDL,JAMES DOUGLAS MD.   James D Kindl, MD 01/14/14 1704 

## 2014-01-15 LAB — CULTURE, GROUP A STREP

## 2014-01-17 ENCOUNTER — Emergency Department (HOSPITAL_COMMUNITY)
Admission: EM | Admit: 2014-01-17 | Discharge: 2014-01-17 | Disposition: A | Discharge status: Home / Self Care | Payer: 59 | Attending: Emergency Medicine | Admitting: Emergency Medicine

## 2014-01-17 ENCOUNTER — Emergency Department (HOSPITAL_COMMUNITY): Payer: 59

## 2014-01-17 ENCOUNTER — Encounter (HOSPITAL_COMMUNITY): Payer: Self-pay | Admitting: Emergency Medicine

## 2014-01-17 DIAGNOSIS — F411 Generalized anxiety disorder: Secondary | ICD-10-CM | POA: Insufficient documentation

## 2014-01-17 DIAGNOSIS — F329 Major depressive disorder, single episode, unspecified: Secondary | ICD-10-CM | POA: Insufficient documentation

## 2014-01-17 DIAGNOSIS — IMO0002 Reserved for concepts with insufficient information to code with codable children: Secondary | ICD-10-CM | POA: Insufficient documentation

## 2014-01-17 DIAGNOSIS — Z79899 Other long term (current) drug therapy: Secondary | ICD-10-CM | POA: Insufficient documentation

## 2014-01-17 DIAGNOSIS — J40 Bronchitis, not specified as acute or chronic: Secondary | ICD-10-CM

## 2014-01-17 DIAGNOSIS — I10 Essential (primary) hypertension: Secondary | ICD-10-CM | POA: Insufficient documentation

## 2014-01-17 DIAGNOSIS — M129 Arthropathy, unspecified: Secondary | ICD-10-CM | POA: Insufficient documentation

## 2014-01-17 DIAGNOSIS — F17200 Nicotine dependence, unspecified, uncomplicated: Secondary | ICD-10-CM | POA: Insufficient documentation

## 2014-01-17 DIAGNOSIS — Z792 Long term (current) use of antibiotics: Secondary | ICD-10-CM | POA: Insufficient documentation

## 2014-01-17 DIAGNOSIS — J45909 Unspecified asthma, uncomplicated: Secondary | ICD-10-CM | POA: Insufficient documentation

## 2014-01-17 DIAGNOSIS — F3289 Other specified depressive episodes: Secondary | ICD-10-CM | POA: Insufficient documentation

## 2014-01-17 DIAGNOSIS — E119 Type 2 diabetes mellitus without complications: Secondary | ICD-10-CM | POA: Insufficient documentation

## 2014-01-17 DIAGNOSIS — Z8669 Personal history of other diseases of the nervous system and sense organs: Secondary | ICD-10-CM | POA: Insufficient documentation

## 2014-01-17 DIAGNOSIS — K219 Gastro-esophageal reflux disease without esophagitis: Secondary | ICD-10-CM | POA: Insufficient documentation

## 2014-01-17 MED ORDER — PREDNISONE 50 MG PO TABS
60.0000 mg | ORAL_TABLET | Freq: Once | ORAL | Status: AC
  Administered 2014-01-17: 60 mg via ORAL
  Filled 2014-01-17 (×2): qty 1

## 2014-01-17 MED ORDER — IPRATROPIUM-ALBUTEROL 0.5-2.5 (3) MG/3ML IN SOLN
3.0000 mL | Freq: Once | RESPIRATORY_TRACT | Status: AC
  Administered 2014-01-17: 3 mL via RESPIRATORY_TRACT
  Filled 2014-01-17: qty 3

## 2014-01-17 MED ORDER — HYDROCOD POLST-CHLORPHEN POLST 10-8 MG/5ML PO LQCR: 5.0000 mL | Freq: Every evening | ORAL | Status: DC | PRN

## 2014-01-17 MED ORDER — PREDNISONE 20 MG PO TABS: 60.0000 mg | ORAL_TABLET | Freq: Every day | ORAL | Status: DC

## 2014-01-17 NOTE — ED Notes (Signed)
Patient states cough began on Saturday; states was seen at Urgent Care on Monday and diagnosed with bronchitis.  Patient was given antibiotic and Tessalon perles; states has been using without any relief.

## 2014-01-17 NOTE — ED Provider Notes (Signed)
CSN: [id number]     Arrival date & time 01/17/14  0151 History   First MD Initiated Contact with Patient 01/17/14 0435     Chief Complaint  Patient presents with  . Cough     (Consider location/radiation/quality/duration/timing/severity/associated sxs/prior Treatment) Patient is a 58 y.o. female presenting with cough. The history is provided by the patient.  Cough She has had a harsh, nonproductive cough for the last week. She was seen at urgent care 4 days ago and diagnosed with bronchitis. She was sent home with prescriptions for azithromycin, benzonatate, and prednisone. She states cough improved proved slightly but she is not able to sleep because of harsh cough at night. She denies fever, chills, sweats. She denies dyspnea except with she is having a coughing paroxysm. There's been no nausea or vomiting. She denies arthralgias or myalgias. She is complaining of a sore throat because of the constant coughing. She does have an albuterol inhaler at home which she has been using 3 times a day and this does give her a slight, temporary relief.  Past Medical History  Diagnosis Date  . Anxiety   . Depression   . Hypertension   . GERD (gastroesophageal reflux disease)   . Arthritis   . Asthma     related to bronchitis-no recent issues  . PONV (postoperative nausea and vomiting)   . Diabetes mellitus   . Peripheral neuropathy    Past Surgical History  Procedure Laterality Date  . Cesarean section  1984  . Tubal ligation  1989  . Neck surgery  x2-2004  . Cholecystectomy  2003  . Replacement total knee bilateral  2007    [facility], Dr. Romeo Apple  . Colonoscopy  2009  . Foot surgery  2006  . Endometrial ablation  2006  . Wrist arthroscopy  07/08/2011    Procedure: ARTHROSCOPY WRIST;  Surgeon: Marlowe Shores, MD;  Location: [facility];  Service: Orthopedics;  Laterality: Right;  right wrist with ganglionectomy,   . Steriod injection  07/08/2011    Procedure: STEROID  INJECTION;  Surgeon: Marlowe Shores, MD;  Location: [facility];  Service: Orthopedics;  Laterality: Left;  injection 1ml celestone left index finger  . Cardiac catheterization  2011-[place]  . Hammer toe surgery  12/01/2011    Procedure: HAMMER TOE CORRECTION;  Surgeon: Dallas Schimke, DPM;  Location: [facility];  Service: Orthopedics;  Laterality: Right;  Arthroplasty 5th Digit Right Foot   Family History  Problem Relation Age of Onset  . Asthma Other   . COPD Other   . Hypertension Other   . Stroke Other   . Heart disease Other    History  Substance Use Topics  . Smoking status: Current Every Day Smoker -- 0.50 packs/day for 40 years    Types: Cigarettes  . Smokeless tobacco: Never Used  . Alcohol Use: No   OB History   Grav Para Term Preterm Abortions TAB SAB Ect Mult Living                 Review of Systems  Respiratory: Positive for cough.   All other systems reviewed and are negative.     Allergies  Codeine; Levofloxacin; and Adhesive  Home Medications   Prior to Admission medications   Medication Sig Start Date End Date Taking? Authorizing Provider  albuterol (PROVENTIL HFA;VENTOLIN HFA) 108 (90 BASE) MCG/ACT inhaler Inhale 2 puffs into the lungs 4 (four) times daily. 10/18/13  Yes Reuben Likes,  MD  aspirin EC 81 MG tablet Take 81 mg by mouth every morning.   Yes Historical Provider, MD  azithromycin (ZITHROMAX Z-PAK) 250 MG tablet Take as directed. 01/13/14  Yes Lonia SkinnerLeslie K Sofia, PA-C  benzonatate (TESSALON) 200 MG capsule Take 1 capsule (200 mg total) by mouth 3 (three) times daily as needed for cough. 01/13/14  Yes Lonia SkinnerLeslie K Sofia, PA-C  esomeprazole (NEXIUM) 40 MG capsule Take 40 mg by mouth daily before breakfast.   Yes Historical Provider, MD  metFORMIN (GLUCOPHAGE) 500 MG tablet Take 500 mg by mouth 2 (two) times daily with a meal. Take 1 tablet by mouth every morning and 2 in the evening (1500 mg total)   Yes Historical Provider, MD   potassium chloride SA (K-DUR,KLOR-CON) 20 MEQ tablet Take 20 mEq by mouth daily as needed. For cramping   Yes Historical Provider, MD  predniSONE (DELTASONE) 20 MG tablet Take 1 tablet (20 mg total) by mouth 2 (two) times daily. 01/13/14  Yes Lonia SkinnerLeslie K Sofia, PA-C  rosuvastatin (CRESTOR) 5 MG tablet Take 5 mg by mouth daily.   Yes Historical Provider, MD  traZODone (DESYREL) 50 MG tablet Take 50 mg by mouth at bedtime.   Yes Historical Provider, MD  triamterene-hydrochlorothiazide (DYAZIDE) 37.5-25 MG per capsule Take 1 capsule by mouth every morning.     Yes Historical Provider, MD  Vortioxetine HBr (BRINTELLIX) 10 MG TABS Take 1 tablet by mouth daily.   Yes Historical Provider, MD   BP 130/67  Pulse 77  Temp(Src) 98 F (36.7 C) (Oral)  Resp 20  Ht 5\' 4"  (1.626 m)  Wt 250 lb (113.399 kg)  BMI 42.89 kg/m2  SpO2 98% Physical Exam  Nursing note and vitals reviewed.  58 year old female, resting comfortably and in no acute distress. Vital signs are normal. Oxygen saturation is 98%, which is normal. Head is normocephalic and atraumatic. PERRLA, EOMI. Oropharynx is clear. Neck is nontender and supple without adenopathy or JVD. Back is nontender and there is no CVA tenderness. Lungs have prolonged exhalation phase without overt rales, wheezes, or rhonchi. Chest is nontender. Heart has regular rate and rhythm without murmur. Abdomen is soft, flat, nontender without masses or hepatosplenomegaly and peristalsis is normoactive. Extremities have no cyanosis or edema, full range of motion is present. Skin is warm and dry without rash. Neurologic: Mental status is normal, cranial nerves are intact, there are no motor or sensory deficits.' ED Course  Procedures (including critical care time)  Imaging Review Dg Chest 2 View (if Patient Has Fever And/or Copd)  01/17/2014   CLINICAL DATA:  Cough. Shortness of breath. History of asthma and hypertension. Tobacco use.  EXAM: CHEST  2 VIEW  COMPARISON:   10/18/2013  FINDINGS: Airway thickening is present, suggesting bronchitis or reactive airways disease. Mildly enlarged cardiopericardial silhouette. Lower cervical plate and screw fixator. No edema. No pleural effusion.  Mild thoracic spondylosis.  IMPRESSION: 1. Mild cardiomegaly, without edema. 2. Airway thickening is present, suggesting bronchitis or reactive airways disease. 3. Mild thoracic spondylosis.   Electronically Signed   By: Herbie BaltimoreWalt  Liebkemann M.D.   On: 01/17/2014 02:38    MDM   Final diagnoses:  Bronchitis    Acute bronchitis which is failed to respond to initial treatment. Chest x-rays obtained and shows no evidence of pneumonia. Old records are reviewed and she was discharged on 40 mg of prednisone a day. That may not be sufficient for her. She's given a dose of prednisone 60 mg and  is given albuterol with ipratropium nebulizer treatment.  She had considerable relief with the above-noted treatment. She is discharged with a prescription for prednisone 60 mg a day for 5 days and is also given a prescription for Tussionex cough syrup to take at bedtime. Follow up with PCP in 5 days.  Dione Boozeavid Shuaib Corsino, MD 01/17/14 [phone]

## 2014-01-17 NOTE — Discharge Instructions (Signed)
Use your inhaler as often as every four hours as needed to control the cough.  Bronchitis Bronchitis is inflammation of the airways that extend from the windpipe into the lungs (bronchi). The inflammation often causes mucus to develop, which leads to a cough. If the inflammation becomes severe, it may cause shortness of breath. CAUSES  Bronchitis may be caused by:   Viral infections.   Bacteria.   Cigarette smoke.   Allergens, pollutants, and other irritants.  SIGNS AND SYMPTOMS  The most common symptom of bronchitis is a frequent cough that produces mucus. Other symptoms include:  Fever.   Body aches.   Chest congestion.   Chills.   Shortness of breath.   Sore throat.  DIAGNOSIS  Bronchitis is usually diagnosed through a medical history and physical exam. Tests, such as chest X-rays, are sometimes done to rule out other conditions.  TREATMENT  You may need to avoid contact with whatever caused the problem (smoking, for example). Medicines are sometimes needed. These may include:  Antibiotics. These may be prescribed if the condition is caused by bacteria.  Cough suppressants. These may be prescribed for relief of cough symptoms.   Inhaled medicines. These may be prescribed to help open your airways and make it easier for you to breathe.   Steroid medicines. These may be prescribed for those with recurrent (chronic) bronchitis. HOME CARE INSTRUCTIONS  Get plenty of rest.   Drink enough fluids to keep your urine clear or pale yellow (unless you have a medical condition that requires fluid restriction). Increasing fluids may help thin your secretions and will prevent dehydration.   Only take over-the-counter or prescription medicines as directed by your health care provider.  Only take antibiotics as directed. Make sure you finish them even if you start to feel better.  Avoid secondhand smoke, irritating chemicals, and strong fumes. These will make  bronchitis worse. If you are a smoker, quit smoking. Consider using nicotine gum or skin patches to help control withdrawal symptoms. Quitting smoking will help your lungs heal faster.   Put a cool-mist humidifier in your bedroom at night to moisten the air. This may help loosen mucus. Change the water in the humidifier daily. You can also run the hot water in your shower and sit in the bathroom with the door closed for 5-10 minutes.   Follow up with your health care provider as directed.   Wash your hands frequently to avoid catching bronchitis again or spreading an infection to others.  SEEK MEDICAL CARE IF: Your symptoms do not improve after 1 week of treatment.  SEEK IMMEDIATE MEDICAL CARE IF:  Your fever increases.  You have chills.   You have chest pain.   You have worsening shortness of breath.   You have bloody sputum.  You faint.  You have lightheadedness.  You have a severe headache.   You vomit repeatedly. MAKE SURE YOU:   Understand these instructions.  Will watch your condition.  Will get help right away if you are not doing well or get worse. Document Released: 07/11/2005 Document Revised: 05/01/2013 Document Reviewed: 03/05/2013 [facility] Patient Information 2015 [employer] [place]. This information is not intended to replace advice given to you by your health care provider. Make sure you discuss any questions you have with your health care provider.  Prednisone tablets What is this medicine? PREDNISONE (PRED ni sone) is a corticosteroid. It is commonly used to treat inflammation of the skin, joints, lungs, and other organs. Common conditions treated include  asthma, allergies, and arthritis. It is also used for other conditions, such as blood disorders and diseases of the adrenal glands. This medicine may be used for other purposes; ask your health care provider or pharmacist if you have questions. COMMON BRAND NAME(S): Deltasone, Predone, Sterapred,  Sterapred DS What should I tell my health care provider before I take this medicine? They need to know if you have any of these conditions: -Cushing's syndrome -diabetes -glaucoma -heart disease -high blood pressure -infection (especially a virus infection such as chickenpox, cold sores, or herpes) -kidney disease -liver disease -mental illness -myasthenia gravis -osteoporosis -seizures -stomach or intestine problems -thyroid disease -an unusual or allergic reaction to lactose, prednisone, other medicines, foods, dyes, or preservatives -pregnant or trying to get pregnant -breast-feeding How should I use this medicine? Take this medicine by mouth with a glass of water. Follow the directions on the prescription label. Take this medicine with food. If you are taking this medicine once a day, take it in the morning. Do not take more medicine than you are told to take. Do not suddenly stop taking your medicine because you may develop a severe reaction. Your doctor will tell you how much medicine to take. If your doctor wants you to stop the medicine, the dose may be slowly lowered over time to avoid any side effects. Talk to your pediatrician regarding the use of this medicine in children. Special care may be needed. Overdosage: If you think you have taken too much of this medicine contact a poison control center or emergency room at once. NOTE: This medicine is only for you. Do not share this medicine with others. What if I miss a dose? If you miss a dose, take it as soon as you can. If it is almost time for your next dose, talk to your doctor or health care professional. You may need to miss a dose or take an extra dose. Do not take double or extra doses without advice. What may interact with this medicine? Do not take this medicine with any of the following medications: -metyrapone -mifepristone This medicine may also interact with the following  medications: -aminoglutethimide -amphotericin B -aspirin and aspirin-like medicines -barbiturates -certain medicines for diabetes, like glipizide or glyburide -cholestyramine -cholinesterase inhibitors -cyclosporine -digoxin -diuretics -ephedrine -female hormones, like estrogens and birth control pills -isoniazid -ketoconazole -NSAIDS, medicines for pain and inflammation, like ibuprofen or naproxen -phenytoin -rifampin -toxoids -vaccines -warfarin This list may not describe all possible interactions. Give your health care provider a list of all the medicines, herbs, non-prescription drugs, or dietary supplements you use. Also tell them if you smoke, drink alcohol, or use illegal drugs. Some items may interact with your medicine. What should I watch for while using this medicine? Visit your doctor or health care professional for regular checks on your progress. If you are taking this medicine over a prolonged period, carry an identification card with your name and address, the type and dose of your medicine, and your doctor's name and address. This medicine may increase your risk of getting an infection. Tell your doctor or health care professional if you are around anyone with measles or chickenpox, or if you develop sores or blisters that do not heal properly. If you are going to have surgery, tell your doctor or health care professional that you have taken this medicine within the last twelve months. Ask your doctor or health care professional about your diet. You may need to lower the amount of salt you eat.  This medicine may affect blood sugar levels. If you have diabetes, check with your doctor or health care professional before you change your diet or the dose of your diabetic medicine. What side effects may I notice from receiving this medicine? Side effects that you should report to your doctor or health care professional as soon as possible: -allergic reactions like skin rash,  itching or hives, swelling of the face, lips, or tongue -changes in emotions or moods -changes in vision -depressed mood -eye pain -fever or chills, cough, sore throat, pain or difficulty passing urine -increased thirst -swelling of ankles, feet Side effects that usually do not require medical attention (report to your doctor or health care professional if they continue or are bothersome): -confusion, excitement, restlessness -headache -nausea, vomiting -skin problems, acne, thin and shiny skin -trouble sleeping -weight gain This list may not describe all possible side effects. Call your doctor for medical advice about side effects. You may report side effects to FDA at 1-800-FDA-1088. Where should I keep my medicine? Keep out of the reach of children. Store at room temperature between 15 and 30 degrees C (59 and 86 degrees F). Protect from light. Keep container tightly closed. Throw away any unused medicine after the expiration date. NOTE: This sheet is a summary. It may not cover all possible information. If you have questions about this medicine, talk to your doctor, pharmacist, or health care provider.  2015, Elsevier/Gold Standard. (2011-02-24 10:57:14)  Chlorpheniramine; Hydrocodone; Phenylephrine syrup or liquid What is this medicine? CHLORPHENIRAMINE; HYDROCODONE; PHENYLEPHRINE (klor fen IR a meen; hye droe KOE done; fen il EF rin) is a cough and cold medicine. It helps to relieve a runny nose, sneezing, watery eyes, and cough. It also helps to reduce congestion or stuffy nose. This medicine is used to treat the symptoms of upper respiratory tract infections. It medicine will not treat an infection. This medicine may be used for other purposes; ask your health care provider or pharmacist if you have questions. COMMON BRAND NAME(S): Atuss HC, Atuss HD, Atuss MS, B-Tuss, Baltussin HC, Coughtuss, Cyndal HD, Cytuss HC, De-Chlor HC, De-Chlor HD, DroTuss-CP, ED-TLC, ED-Tuss HC,  Endagen-HD, Endal HD, Endal HD Plus, Enplus HD, Exo-Tuss, H C Tussive, Histinex HC, Histussin HC, Hydro PC, Hydro PC II, Hydro PC II Plus, Hydron CP, HyPhen-HD, Iodal HD, Iotussin HC, Liquicough HC, Maxi-Tuss HC, Maxi-Tuss HCX, MedHist HC, Mintuss HC, Mintuss HD, Mintuss MS, Neo HC, Poly-Tussin, Poly-Tussin HD, QRP Tussin, Relacon HC, Relacon HC NR, Relasin-HC, Rindal HD, Rindal HD Plus, Triant-HC, [place]-D, Tuss DC, Tuss HC, Tuss PD, Uni-Tricof, Uni-Tuss HC, Vanex HD, Z-Cof HC What should I tell my health care provider before I take this medicine? They need to know if you have any of these conditions: -asthma -chronic cough -diabetes -difficulty passing urine -heart disease -high blood pressure -peripheral vascular disease -phenylketonuria -an unusual or allergic reaction to Chlorpheniramine; Hydrocodone; Phenylephrine, other medicines, foods, dyes, or preservatives -pregnant or trying to get pregnant -breast-feeding How should I use this medicine? Take this medicine by mouth with a glass of water. Follow the directions on the prescription label. Shake well before using. If this medicine upsets your stomach, take it with food or milk . Use a specially marked spoon or container to measure your medicine. Household spoons are not accurate. Take your doses at regular intervals. Do not take your medicine more often than directed. Talk to your pediatrician regarding the use of this medicine in children. While this drug may be prescribed for children  as young as 61 years old for selected conditions, precautions do apply. This medicine is not approved for use in children less than 56 years old. Overdosage: If you think you have taken too much of this medicine contact a poison control center or emergency room at once. NOTE: This medicine is only for you. Do not share this medicine with others. What if I miss a dose? If you miss a dose, take it as soon as you can. If it is almost time for your next dose,  take only that dose. Do not take double or extra doses. What may interact with this medicine? Do not take this medicine with any of the following medications: -antihistamines for allergy, cough and cold -MAOIs like Carbex, Eldepryl, Marplan, Nardil, and Parnate -medicines for weight loss -procarbazine -some medicines for migraine headaches -stimulants This medicine may also interact with the following medications: -alcohol -barbiturates like phenobarbital -heart medicines -medicines for depression, anxiety, or psychotic disturbances -medicines for sleep -muscle relaxants -narcotic medicines (opiates) for pain -phenothiazines like chlorpromazine, mesoridazine, prochlorperazine, thioridazine -some medicines used during surgery -tramadol This list may not describe all possible interactions. Give your health care provider a list of all the medicines, herbs, non-prescription drugs, or dietary supplements you use. Also tell them if you smoke, drink alcohol, or use illegal drugs. Some items may interact with your medicine. What should I watch for while using this medicine? Tell your doctor or health care professional if your symptoms do not improve in 1 week. If you also have a high fever, skin rash, continuing headache, or sore throat, see your doctor. You may develop tolerance to this medicine if you take it for a long time. Tolerance means that you will get less cough relief with time. Do not suddenly stop taking your medicine because you may develop a severe reaction. Your body becomes used to the medicine. This does NOT mean you are addicted. Addiction is a behavior related to getting and using a drug for a non-medical reason. If your doctor wants you to stop the medicine, the dose will be slowly lowered over time to avoid any side effects. You may get drowsy or dizzy. Do not drive, use machinery, or do anything that needs mental alertness until you know how this medicine affects you. Do not  stand or sit up quickly, especially if you are an older patient. This reduces the risk of dizzy or fainting spells. Alcohol may interfere with the effect of this medicine. Avoid alcoholic drinks. The medicine will cause constipation. Try to have a bowel movement at least every 2 to 3 days. If you do not have a bowel movement for 3 days, call your doctor or health care professional. This medicine may cause dry eyes and blurred vision. If you wear contact lenses you may feel some discomfort. Lubricating drops may help. See your eye doctor if the problem does not go away or is severe. What side effects may I notice from receiving this medicine? Side effects that you should report to your doctor or health care professional as soon as possible: -allergic reactions like skin rash, itching or hives, swelling of the face, lips, or tongue -change in vision -chest pain, tightness -cold, clammy skin -confusion, anxiety, fear -difficulty passing urine -fainting -fast or irregular heartbeat -high or low blood pressure -slow or troubled breathing Side effects that usually do not require medical attention (report to your doctor or health care professional if they continue or are bothersome): -constipation -dry eyes -dry mouth -  headache -nausea, vomiting -stomach upset This list may not describe all possible side effects. Call your doctor for medical advice about side effects. You may report side effects to FDA at 1-800-FDA-1088. Where should I keep my medicine? Keep out of the reach of children. This medicine can be abused. Keep your medicine in a safe place to protect it from theft. Do not share this medicine with anyone. Selling or giving away this medicine is dangerous and against the law. Store at room temperature between 20 and 25 degrees C (68 and 77 degrees F). Do not freeze. Keep container tightly closed. Throw away any unused medicine after the expiration date. Discard unused medicine and used  packaging carefully. Pets and children can be harmed if they find used or lost packages. NOTE: This sheet is a summary. It may not cover all possible information. If you have questions about this medicine, talk to your doctor, pharmacist, or health care provider.  2015, Elsevier/Gold Standard. (2011-03-22 10:34:57)

## 2014-01-20 ENCOUNTER — Encounter (HOSPITAL_COMMUNITY): Payer: Self-pay | Admitting: Pharmacy Technician

## 2014-01-22 ENCOUNTER — Encounter (HOSPITAL_COMMUNITY)
Admission: RE | Admit: 2014-01-22 | Discharge: 2014-01-22 | Disposition: A | Discharge status: Home / Self Care | Payer: 59 | Source: Ambulatory Visit | Attending: Neurosurgery | Admitting: Neurosurgery

## 2014-01-22 ENCOUNTER — Encounter (HOSPITAL_COMMUNITY): Payer: Self-pay

## 2014-01-22 DIAGNOSIS — Z01810 Encounter for preprocedural cardiovascular examination: Secondary | ICD-10-CM | POA: Insufficient documentation

## 2014-01-22 DIAGNOSIS — Z01812 Encounter for preprocedural laboratory examination: Secondary | ICD-10-CM | POA: Insufficient documentation

## 2014-01-22 HISTORY — DX: Psoriasis, unspecified: L40.9

## 2014-01-22 HISTORY — DX: Headache: R51

## 2014-01-22 HISTORY — DX: Pure hypercholesterolemia, unspecified: E78.00

## 2014-01-22 LAB — TYPE AND SCREEN
ABO/RH(D): O POS
Antibody Screen: NEGATIVE

## 2014-01-22 LAB — BASIC METABOLIC PANEL
Anion gap: 17 — ABNORMAL HIGH (ref 5–15)
BUN: 25 mg/dL — ABNORMAL HIGH (ref 6–23)
CALCIUM: 9.1 mg/dL (ref 8.4–10.5)
CO2: 27 meq/L (ref 19–32)
CREATININE: 1.2 mg/dL — AB (ref 0.50–1.10)
Chloride: 93 mEq/L — ABNORMAL LOW (ref 96–112)
GFR calc Af Amer: 57 mL/min — ABNORMAL LOW (ref >90)
GFR calc non Af Amer: 50 mL/min — ABNORMAL LOW (ref >90)
Glucose, Bld: 243 mg/dL — ABNORMAL HIGH (ref 70–99)
Potassium: 3.7 mEq/L (ref 3.7–5.3)
Sodium: 137 mEq/L (ref 137–147)

## 2014-01-22 LAB — CBC
HCT: 49.6 % — ABNORMAL HIGH (ref 36.0–46.0)
HEMOGLOBIN: 16.6 g/dL — AB (ref 12.0–15.0)
MCH: 27.9 pg (ref 26.0–34.0)
MCHC: 33.5 g/dL (ref 30.0–36.0)
MCV: 83.5 fL (ref 78.0–100.0)
Platelets: 264 10*3/uL (ref 150–400)
RBC: 5.94 MIL/uL — ABNORMAL HIGH (ref 3.87–5.11)
RDW: 15.1 % (ref 11.5–15.5)
WBC: 17.3 10*3/uL — ABNORMAL HIGH (ref 4.0–10.5)

## 2014-01-22 LAB — SURGICAL PCR SCREEN
MRSA, PCR: NEGATIVE
STAPHYLOCOCCUS AUREUS: NEGATIVE

## 2014-01-22 LAB — ABO/RH: ABO/RH(D): O POS

## 2014-01-22 NOTE — Progress Notes (Signed)
01/22/14 1531  OBSTRUCTIVE SLEEP APNEA  Have you ever been diagnosed with sleep apnea through a sleep study? No  Do you snore loudly (loud enough to be heard through closed doors)?  0  Do you often feel tired, fatigued, or sleepy during the daytime? 0  Has anyone observed you stop breathing during your sleep? 0  Do you have, or are you being treated for high blood pressure? 1  BMI more than 35 kg/m2? 1  Age over 57 years old? 1  Neck circumference greater than 40 cm/16 inches? 1  Gender: 0  Obstructive Sleep Apnea Score 4

## 2014-01-22 NOTE — Pre-Procedure Instructions (Signed)
Dawn CopasJanet B Kirk  01/22/2014   Your procedure is scheduled on: Friday, January 31, 2014  [facility]45 [facility].  Call this number if you have problems the morning of surgery: [phone]   Remember:   Do not eat food or drink liquids after midnight Thursday, January 30, 2014   Take these medicines the morning of surgery with A SIP OF WATER: esomeprazole (NEXIUM), Vortioxetine HBr (BRINTELLIX), if needed:benzonatate (TESSALON) for cough, albuterol (PROVENTIL inhaler for wheezing or shortness of breath ( Bring inhaler in with you on day of procedure). Stop taking Aspirin, vitamins, and herbal medications. Do not take any NSAIDs ie: Ibuprofen, Advil, Naproxen or any medication containing Aspirin; stop 5 days prior to procedure ( Sunday 01/26/14).  Do not wear jewelry, make-up or nail polish.  Do not wear lotions, powders, or perfumes. You may wear deodorant.  Do not shave 48 hours prior to surgery.   Do not bring valuables to the hospital.  [facility] not responsible for any belongings or valuables.               Contacts, dentures or bridgework may not be worn into surgery.  Leave suitcase in the car. After surgery it may be brought to your room.  For patients admitted to the hospital, discharge time is determined by your treatment team.               Patients discharged the day of surgery will not be allowed to drive home.  Name and phone number of your driver:   Special Instructions:  Special Instructions:Special Instructions: [facility]  Before surgery, you can play an important role.  Because skin is not sterile, your skin needs to be as free of germs as possible.  You can reduce the number of germs on you skin by washing with CHG (chlorahexidine gluconate) soap before surgery.  CHG is an antiseptic cleaner which kills germs and bonds with the skin to continue killing germs even after washing.  Please DO NOT use if you have an allergy  to CHG or antibacterial soaps.  If your skin becomes reddened/irritated stop using the CHG and inform your nurse when you arrive at Short Stay.  Do not shave (including legs and underarms) for at least 48 hours prior to the first CHG shower.  You may shave your face.  Please follow these instructions carefully:   1.  Shower with CHG Soap the night before surgery and the morning of Surgery.  2.  If you choose to wash your hair, wash your hair first as usual with your normal shampoo.  3.  After you shampoo, rinse your hair and body thoroughly to remove the Shampoo.  4.  Use CHG as you would any other liquid soap.  You can apply chg directly  to the skin and wash gently with scrungie or a clean washcloth.  5.  Apply the CHG Soap to your body ONLY FROM THE NECK DOWN.  Do not use on open wounds or open sores.  Avoid contact with your eyes, ears, mouth and genitals (private parts).  Wash genitals (private parts) with your normal soap.  6.  Wash thoroughly, paying special attention to the area where your surgery will be performed.  7.  Thoroughly rinse your body with warm water from the neck down.  8.  DO NOT shower/wash with your normal soap after using and rinsing off the CHG Soap.  9.  Pat yourself dry with a clean towel.            10.  Wear clean pajamas.            11.  Place clean sheets on your bed the night of your first shower and do not sleep with pets.  Day of Surgery  Do not apply any lotions the morning of surgery.  Please wear clean clothes to the hospital/surgery center.   Please read over the following fact sheets that you were given: Pain Booklet, Coughing and Deep Breathing, Blood Transfusion Information, MRSA Information and Surgical Site Infection Prevention

## 2014-01-23 NOTE — Progress Notes (Addendum)
Anesthesia Chart Review:  Patient is a 58 year old female scheduled for L5-S1 PLIF on 01/31/14 by Dr. Franky Machoabbell.  History includes smoking, HTN, asthma, GERD, anxiety, depression, DM2, peripheral neuropathy, psoriasis, headaches, post-operative N/V, arthritis, bilateral TKA, neck surgery, mild coronary plaque by 2011 cath. OSA screening score was 4. She was treated for acute bronchitis 01/17/14, and is not completed a short course of prednisone.  EKG on 01/22/14 showed NSR, cannot rule out anterior infarct (age undetermined). Poor anterior r wave progression is new since 07/07/11. She denied any chest pain, SOB at her PAT visit.  Cardiac cath on 10/01/09 (Dr. Antoine PocheHochrein) showed: NL LM. Proximal LAD with luminal irregularities. D1 small and NL. RI small with moderate ostial spasm and fixed 30% stenosis. CX in the AV grove with ostial spasm but cleared with intracoronary nitroglycerin. OM NL. 2 PL branches NL. RCA and PDA NL. EF 65%. Conclusion: Mild coronary plaque. Normal LVF. No further cardiac work-up recommended.  CXR on 01/17/14 showed:  1. Mild cardiomegaly, without edema.  2. Airway thickening is present, suggesting bronchitis or reactive airways disease.  3. Mild thoracic spondylosis.  Preoperative labs from 01/22/14 noted. Non-fasting glucose 243. WBC 17.3. She completed her steroids on 01/21/14, so that may be contributing to her hyperglycemia and leukocytosis. She told her PAT RN that she was better from her bronchitis.  I did instruct her PAT RN to tell patient to follow-up with her PCP if she had progressive or new respiratory symptoms before her surgery, as her anesthesiologist would want these addressed and would want her back at her baseline for surgery.  Unfortunately, she continues to smoke. Dr. Franky Machoabbell office is now closed, so I will follow-up with his office on Monday 01/27/14 to review her abnormal labs as Dr. Franky Machoabbell may want her re-evaluated by her PCP prior to surgery.  For now, will plan on a  fasting CBG on arrival and a repeat CBC.  If on preoperative evaluation there are no obvious signs of infection and if her WBC is trending down then that would better support steroid induced leukocytosis.   Dawn Ochsllison Lafe Clerk, PA-C [facility]An[facility]iology Phone [phone] 01/23/2014      Addendum: 01/27/2014 9:19 AM Darl PikesSusan at Dr. Sueanne Margaritaabbell's office notified of abnormal WBC and glucose and of patient's treatment for bronchitis with steroids completed 01/21/14.  I have ordered a repeat CBC for the day of surgery. She will review with Dr. Mikal Planeabell to determine additional recommendations, if any.

## 2014-01-23 NOTE — Progress Notes (Signed)
Pt denies SOB, chest pain, and being under the care of a cardiologist. Pt denies having a stress test and echo but stated that she had [facility]. Pt denies having an EKG within the last year. [facility] to review CXR, EKG, and abnormal labs.

## 2014-01-30 MED ORDER — CEFAZOLIN SODIUM-DEXTROSE 2-3 GM-% IV SOLR: 2.0000 g | INTRAVENOUS | Status: DC

## 2014-01-31 ENCOUNTER — Encounter (HOSPITAL_COMMUNITY): Admission: RE | Payer: Self-pay | Source: Ambulatory Visit

## 2014-01-31 ENCOUNTER — Inpatient Hospital Stay (HOSPITAL_COMMUNITY): Admission: RE | Admit: 2014-01-31 | Payer: 59 | Source: Ambulatory Visit | Admitting: Neurosurgery

## 2014-01-31 SURGERY — POSTERIOR LUMBAR FUSION 1 LEVEL
Anesthesia: General

## 2014-02-01 ENCOUNTER — Encounter (HOSPITAL_COMMUNITY): Payer: 59 | Admitting: Vascular Surgery

## 2014-02-01 ENCOUNTER — Inpatient Hospital Stay (HOSPITAL_COMMUNITY): Payer: 59

## 2014-02-01 ENCOUNTER — Encounter (HOSPITAL_COMMUNITY)
Admission: AD | Disposition: A | Discharge status: Home / Self Care | Payer: Self-pay | Source: Ambulatory Visit | Attending: Neurosurgery

## 2014-02-01 ENCOUNTER — Encounter (HOSPITAL_COMMUNITY): Payer: Self-pay | Admitting: *Deleted

## 2014-02-01 ENCOUNTER — Inpatient Hospital Stay (HOSPITAL_COMMUNITY): Payer: 59 | Admitting: Anesthesiology

## 2014-02-01 ENCOUNTER — Inpatient Hospital Stay (HOSPITAL_COMMUNITY)
Admission: AD | Admit: 2014-02-01 | Discharge: 2014-02-04 | DRG: 460 | Disposition: A | Discharge status: Home / Self Care | Payer: 59 | Source: Ambulatory Visit | Attending: Neurosurgery | Admitting: Neurosurgery

## 2014-02-01 DIAGNOSIS — F329 Major depressive disorder, single episode, unspecified: Secondary | ICD-10-CM | POA: Diagnosis present

## 2014-02-01 DIAGNOSIS — F3289 Other specified depressive episodes: Secondary | ICD-10-CM | POA: Diagnosis present

## 2014-02-01 DIAGNOSIS — E119 Type 2 diabetes mellitus without complications: Secondary | ICD-10-CM | POA: Diagnosis present

## 2014-02-01 DIAGNOSIS — I10 Essential (primary) hypertension: Secondary | ICD-10-CM | POA: Diagnosis present

## 2014-02-01 DIAGNOSIS — Z8249 Family history of ischemic heart disease and other diseases of the circulatory system: Secondary | ICD-10-CM

## 2014-02-01 DIAGNOSIS — Q762 Congenital spondylolisthesis: Principal | ICD-10-CM

## 2014-02-01 DIAGNOSIS — M4317 Spondylolisthesis, lumbosacral region: Secondary | ICD-10-CM

## 2014-02-01 DIAGNOSIS — Z7982 Long term (current) use of aspirin: Secondary | ICD-10-CM

## 2014-02-01 DIAGNOSIS — Z9889 Other specified postprocedural states: Secondary | ICD-10-CM

## 2014-02-01 DIAGNOSIS — Z833 Family history of diabetes mellitus: Secondary | ICD-10-CM

## 2014-02-01 HISTORY — PX: POSTERIOR LUMBAR FUSION: SHX6036

## 2014-02-01 LAB — CBC
HEMATOCRIT: 45.3 % (ref 36.0–46.0)
HEMOGLOBIN: 14.9 g/dL (ref 12.0–15.0)
MCH: 27.7 pg (ref 26.0–34.0)
MCHC: 32.9 g/dL (ref 30.0–36.0)
MCV: 84.2 fL (ref 78.0–100.0)
PLATELETS: 215 10*3/uL (ref 150–400)
RBC: 5.38 MIL/uL — AB (ref 3.87–5.11)
RDW: 15.6 % — ABNORMAL HIGH (ref 11.5–15.5)
WBC: 9.3 10*3/uL (ref 4.0–10.5)

## 2014-02-01 LAB — GLUCOSE, CAPILLARY
GLUCOSE-CAPILLARY: 160 mg/dL — AB (ref 70–99)
Glucose-Capillary: 211 mg/dL — ABNORMAL HIGH (ref 70–99)
Glucose-Capillary: 190 mg/dL — ABNORMAL HIGH (ref 70–99)
Glucose-Capillary: 211 mg/dL — ABNORMAL HIGH (ref 70–99)

## 2014-02-01 LAB — HEMOGLOBIN A1C
Hgb A1c MFr Bld: 7.7 % — ABNORMAL HIGH (ref <5.7)
Mean Plasma Glucose: 174 mg/dL — ABNORMAL HIGH (ref <117)

## 2014-02-01 SURGERY — POSTERIOR LUMBAR FUSION
Anesthesia: General | Site: Back

## 2014-02-01 MED ORDER — FENTANYL CITRATE 0.05 MG/ML IJ SOLN
INTRAMUSCULAR | Status: AC
  Filled 2014-02-01: qty 5

## 2014-02-01 MED ORDER — HYDROCOD POLST-CHLORPHEN POLST 10-8 MG/5ML PO LQCR: 5.0000 mL | Freq: Every evening | ORAL | Status: DC | PRN

## 2014-02-01 MED ORDER — CEFAZOLIN SODIUM-DEXTROSE 2-3 GM-% IV SOLR
INTRAVENOUS | Status: DC | PRN
  Administered 2014-02-01 (×2): 2 g via INTRAVENOUS

## 2014-02-01 MED ORDER — SENNA 8.6 MG PO TABS
1.0000 | ORAL_TABLET | Freq: Two times a day (BID) | ORAL | Status: DC
Start: 2014-02-01 — End: 2014-02-05
  Administered 2014-02-01 – 2014-02-04 (×6): 8.6 mg via ORAL
  Filled 2014-02-01 (×6): qty 1

## 2014-02-01 MED ORDER — ARTIFICIAL TEARS OP OINT
TOPICAL_OINTMENT | OPHTHALMIC | Status: DC | PRN
  Administered 2014-02-01: 1 via OPHTHALMIC

## 2014-02-01 MED ORDER — SODIUM CHLORIDE 0.9 % IJ SOLN: 3.0000 mL | INTRAMUSCULAR | Status: DC | PRN

## 2014-02-01 MED ORDER — PROMETHAZINE HCL 25 MG/ML IJ SOLN: 6.2500 mg | INTRAMUSCULAR | Status: DC | PRN

## 2014-02-01 MED ORDER — ALBUTEROL SULFATE (2.5 MG/3ML) 0.083% IN NEBU: 2.5000 mg | INHALATION_SOLUTION | Freq: Four times a day (QID) | RESPIRATORY_TRACT | Status: DC | PRN

## 2014-02-01 MED ORDER — LACTATED RINGERS IV SOLN
INTRAVENOUS | Status: DC
  Administered 2014-02-01: 07:00:00 via INTRAVENOUS

## 2014-02-01 MED ORDER — EPHEDRINE SULFATE 50 MG/ML IJ SOLN
INTRAMUSCULAR | Status: AC
  Filled 2014-02-01: qty 1

## 2014-02-01 MED ORDER — ARTIFICIAL TEARS OP OINT
TOPICAL_OINTMENT | OPHTHALMIC | Status: AC
  Filled 2014-02-01: qty 3.5

## 2014-02-01 MED ORDER — BENZONATATE 100 MG PO CAPS: 200.0000 mg | ORAL_CAPSULE | Freq: Three times a day (TID) | ORAL | Status: DC | PRN

## 2014-02-01 MED ORDER — ACETAMINOPHEN 650 MG RE SUPP: 650.0000 mg | RECTAL | Status: DC | PRN

## 2014-02-01 MED ORDER — LACTATED RINGERS IV SOLN
INTRAVENOUS | Status: DC | PRN
  Administered 2014-02-01 (×3): via INTRAVENOUS

## 2014-02-01 MED ORDER — ASPIRIN EC 81 MG PO TBEC
81.0000 mg | DELAYED_RELEASE_TABLET | Freq: Every morning | ORAL | Status: DC
  Administered 2014-02-01 – 2014-02-04 (×4): 81 mg via ORAL
  Filled 2014-02-01 (×4): qty 1

## 2014-02-01 MED ORDER — NEOSTIGMINE METHYLSULFATE 10 MG/10ML IV SOLN
INTRAVENOUS | Status: AC
  Filled 2014-02-01: qty 1

## 2014-02-01 MED ORDER — INSULIN ASPART 100 UNIT/ML ~~LOC~~ SOLN
0.0000 [IU] | Freq: Three times a day (TID) | SUBCUTANEOUS | Status: DC
  Administered 2014-02-01: 17:00:00 via SUBCUTANEOUS
  Administered 2014-02-02 – 2014-02-03 (×4): 3 [IU] via SUBCUTANEOUS
  Administered 2014-02-03 – 2014-02-04 (×2): 4 [IU] via SUBCUTANEOUS

## 2014-02-01 MED ORDER — HYDROMORPHONE HCL PF 1 MG/ML IJ SOLN
0.2500 mg | INTRAMUSCULAR | Status: DC | PRN
  Administered 2014-02-01 (×4): 0.5 mg via INTRAVENOUS

## 2014-02-01 MED ORDER — PHENOL 1.4 % MT LIQD: 1.0000 | OROMUCOSAL | Status: DC | PRN

## 2014-02-01 MED ORDER — HYDROCODONE-ACETAMINOPHEN 5-325 MG PO TABS
1.0000 | ORAL_TABLET | ORAL | Status: DC | PRN
Start: 2014-02-01 — End: 2014-02-05
  Administered 2014-02-02: 2 via ORAL
  Filled 2014-02-01: qty 2

## 2014-02-01 MED ORDER — PROPOFOL 10 MG/ML IV BOLUS
INTRAVENOUS | Status: AC
  Filled 2014-02-01: qty 20

## 2014-02-01 MED ORDER — ROCURONIUM BROMIDE 50 MG/5ML IV SOLN
INTRAVENOUS | Status: AC
  Filled 2014-02-01: qty 1

## 2014-02-01 MED ORDER — ALBUTEROL SULFATE HFA 108 (90 BASE) MCG/ACT IN AERS
INHALATION_SPRAY | RESPIRATORY_TRACT | Status: DC | PRN
  Administered 2014-02-01: 4 via RESPIRATORY_TRACT

## 2014-02-01 MED ORDER — ATORVASTATIN CALCIUM 10 MG PO TABS
10.0000 mg | ORAL_TABLET | Freq: Every day | ORAL | Status: DC
  Administered 2014-02-01 – 2014-02-04 (×4): 10 mg via ORAL
  Filled 2014-02-01 (×4): qty 1

## 2014-02-01 MED ORDER — ONDANSETRON HCL 4 MG/2ML IJ SOLN
INTRAMUSCULAR | Status: AC
  Filled 2014-02-01: qty 4

## 2014-02-01 MED ORDER — PROPOFOL 10 MG/ML IV BOLUS
INTRAVENOUS | Status: DC | PRN
  Administered 2014-02-01: 50 mg via INTRAVENOUS
  Administered 2014-02-01: 20 mg via INTRAVENOUS
  Administered 2014-02-01: 180 mg via INTRAVENOUS
  Administered 2014-02-01: 50 mg via INTRAVENOUS

## 2014-02-01 MED ORDER — PANTOPRAZOLE SODIUM 40 MG PO TBEC
80.0000 mg | DELAYED_RELEASE_TABLET | Freq: Every day | ORAL | Status: DC
  Administered 2014-02-02 – 2014-02-04 (×3): 80 mg via ORAL
  Filled 2014-02-01 (×3): qty 2

## 2014-02-01 MED ORDER — SODIUM CHLORIDE 0.9 % IJ SOLN
INTRAMUSCULAR | Status: AC
  Filled 2014-02-01: qty 10

## 2014-02-01 MED ORDER — VORTIOXETINE HBR 20 MG PO TABS: 20.0000 mg | ORAL_TABLET | Freq: Every day | ORAL | Status: DC

## 2014-02-01 MED ORDER — FENTANYL CITRATE 0.05 MG/ML IJ SOLN
INTRAMUSCULAR | Status: DC | PRN
  Administered 2014-02-01 (×5): 50 ug via INTRAVENOUS
  Administered 2014-02-01: 100 ug via INTRAVENOUS
  Administered 2014-02-01 (×3): 50 ug via INTRAVENOUS

## 2014-02-01 MED ORDER — MENTHOL 3 MG MT LOZG: 1.0000 | LOZENGE | OROMUCOSAL | Status: DC | PRN

## 2014-02-01 MED ORDER — DIAZEPAM 5 MG PO TABS
5.0000 mg | ORAL_TABLET | Freq: Four times a day (QID) | ORAL | Status: DC | PRN
  Administered 2014-02-01 – 2014-02-04 (×7): 5 mg via ORAL
  Filled 2014-02-01 (×8): qty 1

## 2014-02-01 MED ORDER — POLYETHYLENE GLYCOL 3350 17 G PO PACK
17.0000 g | PACK | Freq: Every day | ORAL | Status: DC | PRN
Start: 2014-02-01 — End: 2014-02-05

## 2014-02-01 MED ORDER — SODIUM CHLORIDE 0.9 % IJ SOLN
3.0000 mL | Freq: Two times a day (BID) | INTRAMUSCULAR | Status: DC
Start: 2014-02-01 — End: 2014-02-05
  Administered 2014-02-01 – 2014-02-03 (×2): 3 mL via INTRAVENOUS

## 2014-02-01 MED ORDER — TRAZODONE HCL 50 MG PO TABS
50.0000 mg | ORAL_TABLET | Freq: Every day | ORAL | Status: DC
  Administered 2014-02-01 – 2014-02-04 (×4): 50 mg via ORAL
  Filled 2014-02-01 (×4): qty 1

## 2014-02-01 MED ORDER — LIDOCAINE HCL (CARDIAC) 20 MG/ML IV SOLN
INTRAVENOUS | Status: AC
  Filled 2014-02-01: qty 5

## 2014-02-01 MED ORDER — ACETAMINOPHEN 325 MG PO TABS
650.0000 mg | ORAL_TABLET | ORAL | Status: DC | PRN
  Administered 2014-02-03 – 2014-02-04 (×2): 650 mg via ORAL
  Filled 2014-02-01 (×2): qty 2

## 2014-02-01 MED ORDER — ONDANSETRON HCL 4 MG/2ML IJ SOLN
4.0000 mg | INTRAMUSCULAR | Status: DC | PRN
  Administered 2014-02-02: 4 mg via INTRAVENOUS
  Filled 2014-02-01: qty 2

## 2014-02-01 MED ORDER — LIDOCAINE HCL (CARDIAC) 20 MG/ML IV SOLN
INTRAVENOUS | Status: DC | PRN
  Administered 2014-02-01: 100 mg via INTRAVENOUS

## 2014-02-01 MED ORDER — GLYCOPYRROLATE 0.2 MG/ML IJ SOLN
INTRAMUSCULAR | Status: DC | PRN
  Administered 2014-02-01: 0.6 mg via INTRAVENOUS

## 2014-02-01 MED ORDER — MIDAZOLAM HCL 2 MG/2ML IJ SOLN
INTRAMUSCULAR | Status: AC
  Filled 2014-02-01: qty 2

## 2014-02-01 MED ORDER — LIDOCAINE-EPINEPHRINE 0.5 %-1:200000 IJ SOLN
INTRAMUSCULAR | Status: DC | PRN
  Administered 2014-02-01: 10 mL

## 2014-02-01 MED ORDER — METFORMIN HCL 500 MG PO TABS: 500.0000 mg | ORAL_TABLET | Freq: Two times a day (BID) | ORAL | Status: DC

## 2014-02-01 MED ORDER — GLYCOPYRROLATE 0.2 MG/ML IJ SOLN
INTRAMUSCULAR | Status: AC
  Filled 2014-02-01: qty 3

## 2014-02-01 MED ORDER — HYDROMORPHONE HCL PF 1 MG/ML IJ SOLN
INTRAMUSCULAR | Status: AC
  Filled 2014-02-01: qty 1

## 2014-02-01 MED ORDER — OXYCODONE-ACETAMINOPHEN 5-325 MG PO TABS
1.0000 | ORAL_TABLET | ORAL | Status: DC | PRN
Start: 2014-02-01 — End: 2014-02-05
  Administered 2014-02-01 – 2014-02-04 (×11): 2 via ORAL
  Filled 2014-02-01 (×11): qty 2

## 2014-02-01 MED ORDER — HYDROMORPHONE HCL PF 1 MG/ML IJ SOLN
0.5000 mg | INTRAMUSCULAR | Status: DC | PRN
  Filled 2014-02-01: qty 1

## 2014-02-01 MED ORDER — NEOSTIGMINE METHYLSULFATE 10 MG/10ML IV SOLN: INTRAVENOUS | Status: DC | PRN

## 2014-02-01 MED ORDER — SURGIFOAM 100 EX MISC
CUTANEOUS | Status: DC | PRN
  Administered 2014-02-01: 09:00:00 via TOPICAL

## 2014-02-01 MED ORDER — POTASSIUM CHLORIDE CRYS ER 20 MEQ PO TBCR: 20.0000 meq | EXTENDED_RELEASE_TABLET | Freq: Every day | ORAL | Status: DC | PRN

## 2014-02-01 MED ORDER — SODIUM CHLORIDE 0.9 % IV SOLN
250.0000 mL | INTRAVENOUS | Status: DC
Start: 2014-02-01 — End: 2014-02-05

## 2014-02-01 MED ORDER — METFORMIN HCL 500 MG PO TABS
1000.0000 mg | ORAL_TABLET | Freq: Every day | ORAL | Status: DC
  Administered 2014-02-01 – 2014-02-04 (×4): 1000 mg via ORAL
  Filled 2014-02-01 (×4): qty 2

## 2014-02-01 MED ORDER — METFORMIN HCL 500 MG PO TABS
500.0000 mg | ORAL_TABLET | Freq: Every day | ORAL | Status: DC
  Administered 2014-02-02 – 2014-02-04 (×3): 500 mg via ORAL
  Filled 2014-02-01 (×3): qty 1

## 2014-02-01 MED ORDER — ROCURONIUM BROMIDE 100 MG/10ML IV SOLN
INTRAVENOUS | Status: DC | PRN
  Administered 2014-02-01 (×2): 10 mg via INTRAVENOUS
  Administered 2014-02-01: 20 mg via INTRAVENOUS
  Administered 2014-02-01: 50 mg via INTRAVENOUS

## 2014-02-01 MED ORDER — 0.9 % SODIUM CHLORIDE (POUR BTL) OPTIME
TOPICAL | Status: DC | PRN
  Administered 2014-02-01: 1000 mL

## 2014-02-01 MED ORDER — ALBUTEROL SULFATE HFA 108 (90 BASE) MCG/ACT IN AERS: 2.0000 | INHALATION_SPRAY | Freq: Four times a day (QID) | RESPIRATORY_TRACT | Status: DC | PRN

## 2014-02-01 MED ORDER — ONDANSETRON HCL 4 MG/2ML IJ SOLN
INTRAMUSCULAR | Status: DC | PRN
  Administered 2014-02-01 (×2): 4 mg via INTRAVENOUS

## 2014-02-01 MED ORDER — MIDAZOLAM HCL 5 MG/5ML IJ SOLN
INTRAMUSCULAR | Status: DC | PRN
  Administered 2014-02-01: 2 mg via INTRAVENOUS

## 2014-02-01 MED ORDER — POTASSIUM CHLORIDE IN NACL 20-0.9 MEQ/L-% IV SOLN
INTRAVENOUS | Status: DC
  Administered 2014-02-01 – 2014-02-02 (×2): via INTRAVENOUS
  Administered 2014-02-02: 1000 mL via INTRAVENOUS
  Filled 2014-02-01 (×4): qty 1000

## 2014-02-01 MED ORDER — TRIAMTERENE-HCTZ 37.5-25 MG PO CAPS
1.0000 | ORAL_CAPSULE | Freq: Every day | ORAL | Status: DC
  Administered 2014-02-01: 1 via ORAL
  Filled 2014-02-01 (×3): qty 1

## 2014-02-01 MED ORDER — NEOSTIGMINE METHYLSULFATE 10 MG/10ML IV SOLN
INTRAVENOUS | Status: DC | PRN
  Administered 2014-02-01: 4 mg via INTRAVENOUS

## 2014-02-01 SURGICAL SUPPLY — 72 items
ADH SKN CLS APL DERMABOND .7 (GAUZE/BANDAGES/DRESSINGS) ×1
APL SKNCLS STERI-STRIP NONHPOA (GAUZE/BANDAGES/DRESSINGS)
BAG DECANTER FOR FLEXI CONT (MISCELLANEOUS) ×2 IMPLANT
BENZOIN TINCTURE PRP APPL 2/3 (GAUZE/BANDAGES/DRESSINGS) IMPLANT
BLADE 10 SAFETY STRL DISP (BLADE) ×2 IMPLANT
BLADE SURG ROTATE 9660 (MISCELLANEOUS) IMPLANT
BUR MATCHSTICK NEURO 3.0 LAGG (BURR) ×2 IMPLANT
CAGE CAPSTONE 11X22X0 SPINAL (Cage) ×1 IMPLANT
CANISTER SUCT 3000ML (MISCELLANEOUS) ×2 IMPLANT
CONT SPEC 4OZ CLIKSEAL STRL BL (MISCELLANEOUS) ×2 IMPLANT
COVER BACK TABLE 24X17X13 BIG (DRAPES) IMPLANT
COVER SURGICAL LIGHT HANDLE (MISCELLANEOUS) ×2 IMPLANT
DECANTER SPIKE VIAL GLASS SM (MISCELLANEOUS) ×2 IMPLANT
DERMABOND ADVANCED (GAUZE/BANDAGES/DRESSINGS) ×1
DERMABOND ADVANCED .7 DNX12 (GAUZE/BANDAGES/DRESSINGS) ×1 IMPLANT
DRAPE C-ARM 42X72 X-RAY (DRAPES) ×5 IMPLANT
DRAPE LAPAROTOMY 100X72X124 (DRAPES) ×2 IMPLANT
DRAPE POUCH INSTRU U-SHP 10X18 (DRAPES) ×2 IMPLANT
DRAPE SURG 17X23 STRL (DRAPES) ×2 IMPLANT
DRSG TELFA 3X8 NADH (GAUZE/BANDAGES/DRESSINGS) IMPLANT
DURAPREP 26ML APPLICATOR (WOUND CARE) ×2 IMPLANT
ELECT REM PT RETURN 9FT ADLT (ELECTROSURGICAL) ×2
ELECTRODE REM PT RTRN 9FT ADLT (ELECTROSURGICAL) ×1 IMPLANT
GAUZE SPONGE 4X4 16PLY XRAY LF (GAUZE/BANDAGES/DRESSINGS) ×1 IMPLANT
GLOVE BIO SURGEON STRL SZ 6.5 (GLOVE) ×3 IMPLANT
GLOVE BIO SURGEON STRL SZ7 (GLOVE) ×3 IMPLANT
GLOVE BIO SURGEON STRL SZ7.5 (GLOVE) ×1 IMPLANT
GLOVE BIOGEL PI IND STRL 8 (GLOVE) IMPLANT
GLOVE BIOGEL PI INDICATOR 8 (GLOVE) ×1
GLOVE ECLIPSE 6.5 STRL STRAW (GLOVE) ×4 IMPLANT
GLOVE EXAM NITRILE LRG STRL (GLOVE) IMPLANT
GLOVE EXAM NITRILE MD LF STRL (GLOVE) IMPLANT
GLOVE EXAM NITRILE XL STR (GLOVE) IMPLANT
GLOVE EXAM NITRILE XS STR PU (GLOVE) IMPLANT
GOWN STRL REUS W/ TWL LRG LVL3 (GOWN DISPOSABLE) ×2 IMPLANT
GOWN STRL REUS W/ TWL XL LVL3 (GOWN DISPOSABLE) IMPLANT
GOWN STRL REUS W/TWL 2XL LVL3 (GOWN DISPOSABLE) IMPLANT
GOWN STRL REUS W/TWL LRG LVL3 (GOWN DISPOSABLE) ×8
GOWN STRL REUS W/TWL XL LVL3 (GOWN DISPOSABLE) ×2
GRAFT BN 5X1XSPNE CVD POST DBM (Bone Implant) IMPLANT
GRAFT BONE MAGNIFUSE 1X5CM (Bone Implant) ×2 IMPLANT
KIT BASIN OR (CUSTOM PROCEDURE TRAY) ×2 IMPLANT
KIT POSITION SURG JACKSON T1 (MISCELLANEOUS) ×2 IMPLANT
KIT ROOM TURNOVER OR (KITS) ×2 IMPLANT
NDL HYPO 25X1 1.5 SAFETY (NEEDLE) ×1 IMPLANT
NDL SPNL 18GX3.5 QUINCKE PK (NEEDLE) IMPLANT
NEEDLE HYPO 25X1 1.5 SAFETY (NEEDLE) ×2 IMPLANT
NEEDLE SPNL 18GX3.5 QUINCKE PK (NEEDLE) IMPLANT
NS IRRIG 1000ML POUR BTL (IV SOLUTION) ×2 IMPLANT
PACK LAMINECTOMY NEURO (CUSTOM PROCEDURE TRAY) ×2 IMPLANT
PAD ARMBOARD 7.5X6 YLW CONV (MISCELLANEOUS) ×6 IMPLANT
PAD DRESSING TELFA 3X8 NADH (GAUZE/BANDAGES/DRESSINGS) IMPLANT
ROD CC 30MM (Rod) ×2 IMPLANT
SCREW MAS 5.5X45 (Screw) ×1 IMPLANT
SCREW MAS 5.5X50 (Screw) ×1 IMPLANT
SCREW MAS 6.5X40 (Screw) ×2 IMPLANT
SCREW SET SOLERA (Screw) ×8 IMPLANT
SCREW SET SOLERA TI (Screw) IMPLANT
SPONGE GAUZE 4X4 12PLY (GAUZE/BANDAGES/DRESSINGS) IMPLANT
SPONGE LAP 4X18 X RAY DECT (DISPOSABLE) ×2 IMPLANT
SPONGE SURGIFOAM ABS GEL 100 (HEMOSTASIS) ×2 IMPLANT
STRIP CLOSURE SKIN 1/2X4 (GAUZE/BANDAGES/DRESSINGS) IMPLANT
SUT PROLENE 6 0 BV (SUTURE) IMPLANT
SUT VIC AB 0 CT1 18XCR BRD 8 (SUTURE) IMPLANT
SUT VIC AB 0 CT1 18XCR BRD8 (SUTURE) ×1 IMPLANT
SUT VIC AB 0 CT1 8-18 (SUTURE) ×4
SUT VIC AB 2-0 CT1 18 (SUTURE) ×2 IMPLANT
SUT VIC AB 3-0 SH 8-18 (SUTURE) ×2 IMPLANT
SYR 20ML ECCENTRIC (SYRINGE) ×2 IMPLANT
TOWEL OR 17X24 6PK STRL BLUE (TOWEL DISPOSABLE) ×2 IMPLANT
TOWEL OR 17X26 10 PK STRL BLUE (TOWEL DISPOSABLE) ×2 IMPLANT
WATER STERILE IRR 1000ML POUR (IV SOLUTION) ×2 IMPLANT

## 2014-02-01 NOTE — Anesthesia Procedure Notes (Signed)
Procedure Name: Intubation Date/Time: 02/01/2014 7:53 AM Performed by: Fransisca KaufmannMEYER, Tylisa Alcivar E Pre-anesthesia Checklist: Patient identified, Emergency Drugs available, Suction available, Patient being monitored and Timeout performed Patient Re-evaluated:Patient Re-evaluated prior to inductionOxygen Delivery Method: Circle system utilized Preoxygenation: Pre-oxygenation with 100% oxygen Intubation Type: IV induction Ventilation: Mask ventilation without difficulty Laryngoscope Size: Miller and 3 Grade View: Grade II Tube type: Oral Tube size: 7.5 mm Number of attempts: 1 Airway Equipment and Method: Stylet Placement Confirmation: ETT inserted through vocal cords under direct vision,  positive ETCO2 and breath sounds checked- equal and bilateral Secured at: 22 cm Tube secured with: Tape Dental Injury: Teeth and Oropharynx as per pre-operative assessment

## 2014-02-01 NOTE — Anesthesia Preprocedure Evaluation (Addendum)
Anesthesia Evaluation  Patient identified by MRN, date of birth, ID band Patient awake    Reviewed: Allergy & Precautions, H&P , NPO status , Patient's Chart, lab work & pertinent test results, reviewed documented beta blocker date and time   History of Anesthesia Complications (+) PONV and history of anesthetic complications  Airway Mallampati: II TM Distance: >3 FB Neck ROM: Full    Dental  (+) Partial Upper, Dental Advisory Given   Pulmonary asthma , Current Smoker,    Pulmonary exam normal       Cardiovascular hypertension, Pt. on medications     Neuro/Psych PSYCHIATRIC DISORDERS Anxiety Depression  Neuromuscular disease    GI/Hepatic Neg liver ROS, GERD-  Medicated,  Endo/Other  diabetes, Well Controlled, Type 2, Oral Hypoglycemic AgentsMorbid obesity  Renal/GU Renal InsufficiencyRenal disease     Musculoskeletal   Abdominal (+) + obese,   Peds  Hematology   Anesthesia Other Findings   Reproductive/Obstetrics                          Anesthesia Physical  Anesthesia Plan  ASA: II  Anesthesia Plan: General   Post-op Pain Management:    Induction: Intravenous  Airway Management Planned: LMA  Additional Equipment:   Intra-op Plan:   Post-operative Plan: Extubation in OR  Informed Consent: I have reviewed the patients History and Physical, chart, labs and discussed the procedure including the risks, benefits and alternatives for the proposed anesthesia with the patient or authorized representative who has indicated his/her understanding and acceptance.   Dental advisory given  Plan Discussed with: CRNA, Surgeon and Anesthesiologist  Anesthesia Plan Comments:         Anesthesia Quick Evaluation

## 2014-02-01 NOTE — Op Note (Signed)
02/01/2014  1:10 PM  PATIENT:  Dawn Kirk  58 y.o. female  PRE-OPERATIVE DIAGNOSIS:  Grade 2 spondylolisthesis  POST-OPERATIVE DIAGNOSIS:  Grade 2 spondylolisthesis  PROCEDURE:  Procedure(s): L5 Gill procedure Posterolateral arthrodesis L5-S1, morselized allograft Non-segmental pedicle screw fixation Medtronic solera Interbody arthrodesis morselized autograft SURGEON:  Surgeon(s): Carmela HurtKyle L Rachyl Wuebker, MD Hewitt Shortsobert W Nudelman, MD  ASSISTANTS:nudelman, Molly Madurorobert  ANESTHESIA:   general  EBL:  Total I/O In: 1000 [I.V.:1000] Out: 260 [Urine:60; Blood:200]  BLOOD ADMINISTERED:none  CELL SAVER GIVEN:none  COUNT:per nursing  DRAINS: none   SPECIMEN:  No Specimen  DICTATION: Dawn Kirk is a 58 y.o. female whom was taken to the operating room intubated, and placed under a general anesthetic without difficulty. A foley catheter was placed under sterile conditions. She was positioned prone on a Jackson stable with all pressure points properly padded.  Her lumbar region was prepped and draped in a sterile manner. I infiltrated 10cc's 1/2%lidocaine/1:2000,000 strength epinephrine into the planned incision. I opened the skin with a 10 blade and took the incision down to the thoracolumbar fascia. I exposed the lamina of L4,5, and S1 in a subperiosteal fashion bilaterally. I confirmed my location with an intraoperative xray.  I placed self retaining retractors and started the decompression.  I decompressed the spinal canal with an L5 gill procedure, fully decompressing the spinal canal and the L5 nerve roots bilaterally. I used the [employer] rongeur,Kerrison punches, and the drill to decompress the nerve roots and the spinal canal.  PLIF's were performed at L5/s1 in the same fashion. We opened the disc space with a 15 blade then used a variety of instruments to remove the disc and prepare the space for the arthrodesis. We used curettes, rongeurs, punches, shavers for the disc space, and rasps in the  discetomy. When we assessed the cage with fluoroscopy it was apparent that the cage was going into the body of L5 on the right side. Thus I removed it and packed the space with autograft for an arthrodesis without hardware. I decorticated the lateral bone at L5, and S1. I then placed morselized allograft(bone in a bag) on the decorticated surfaces to complete the posterolateral arthrodesis.  I placed pedicle screws at L5, and S1, using fluoroscopic guidance. I drilled a pilot hole, then cannulated the pedicle with a probe at each site. I then tapped each pedicle, assessing each site for pedicle violations. No cutouts were appreciated. Screws (medtronic solera) were then placed at each site without difficulty. Final films were performed and all screws appeared to be in good position.  I closed the wound in a layered fashion. I approximated the thoracolumbar fascia, subcutaneous, and subcuticular planes with vicryl sutures. I used Dermabond for a sterile dressing.     PLAN OF CARE: Admit to inpatient   PATIENT DISPOSITION:  PACU - hemodynamically stable.   Delay start of Pharmacological VTE agent (>24hrs) due to surgical blood loss or risk of bleeding:  yes

## 2014-02-01 NOTE — H&P (Signed)
BP 108/61  Pulse 75  Temp(Src) 99 F (37.2 C) (Oral)  Resp 20  Ht 5\' 4"  (1.626 m)  Wt 112.583 kg (248 lb 3.2 oz)  BMI 42.58 kg/m2  SpO2 99% HISTORY OF PRESENT ILLNESS:                     Ms. Terrilee CroakKnight comes in today for evaluation and pain that she has in the lower back and both lower extremities.  She said in short my life is basically stopped.  She is in this much pain that she is not able to do any other things that use to make her smile.  I took care of her many years ago for herniated disc in the neck, I believe at C6-7.  She has done very well with that.  She presents today again with this pain in the lower back.  It is worse when she walks.  It is worse when she stands.  She is unwilling to go shopping unless she can lean on her shopping cart.  She has had no bowel or bladder dysfunction.  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  She is right -handed.     Most of the pain winds up being in the left leg, outside of the back.     On the pain chart, she lists pain across the back and into both lower extremities.  She said she has had this for now well over three years.     REVIEW OF SYSTEMS:                                    Eyeglasses, nasal congestion, sinus problems, sinus headache, hypertension, leg pain with walking, back pain, leg pain, depression, and diabetes.  She denies constitutional, respiratory, gastrointestinal, genitourinary, skin, neurological, hematologic or allergic problems.     PAST MEDICAL HISTORY:              Current Medical Conditions:  Significant for hypertension and diabetes.              Prior Operations:  Includes colonoscopy, bilateral knee replacement, hysteroscopic dilatation and curettage, right foot surgery, neck surgery, cholecystectomy, ganglion cystectomy, cesarean section, and tubal ligation.              Medications and Allergies:  SHE HAS AN ALLERGY TO CODEINE AND LEVAQUIN WHICH MAKES HER THROW  UP.  CODEINE CAUSES HALLUCINATIONS.  Medications include aspirin, Brintellix, Crestor, metformin, Nexium, trazodone, and triamterene, hydrochlorothiazide.     FAMILY HISTORY:                                            Mother is [id number], disabled.  Father is [id number], is disabled.  Diabetes, hypertension, and cerebrovascular disease present in the family history.     SOCIAL HISTORY:                                            She does smoke.  She does not use alcohol.  She does not use illicit drugs.     PHYSICAL EXAMINATION:  Vital signs, height is 64 inches, weight is 248 pounds, BMI 42.57, blood pressure 124/77, pulse 81, respiratory rate 20.  On examination, she is alert, oriented x4, and answers all questions appropriately.  Memory, language, attention span and fund of knowledge are normal.  Pupils are equal, round and reactive to light bilaterally.  She has symmetric facial sensation and movements.  Hearing is intact to finger rub bilaterally.  Uvula elevates in midline.  Shoulder shrug is normal.  Tongue protrudes in the midline.  5/5 strength in both upper and lower extremities.  2+ reflexes in biceps, triceps, brachioradialis, knees, and ankles.  Downgoing toes plantar stimulation.  Intact proprioception in upper and lower extremities.  Normal muscle tone and bulk.                                                   Mrs. Coppess returns today with an MRI of the lumbar spine.     Her examination is unchanged from 12/23/2013. The review of systems, past medical history, family history, and social history are also unchanged.          DATA:                                                  What it shows is a grade 2 spondylolisthesis of L5 on S1. The radiologist speaks about a retrolisthesis of 4 and 5 and this is there but that is minimal. While she has the disc bulge at 4-5, she does not have any obvious compression of the neural element where as at 5-1 where she has a slip  she certainly it. She has a spondylolysis in addition. She has some mild canal stenosis there although it is certainly not the worst that one would see. Do I think this is the reason for the pain, the answer is I do. Do I think that we can do something to help her situation, I do.               IMPRESSION/PLAN:                             She has a grade 2 and I certainly know that she did not come on to this planet with one, so these things have simply degenerated more and more. Mrs. Walski would like to proceed with a lumbar fusion. We will schedule this tentatively for 01/31/2014 involving screws and rods. I do believe that this will help. I believe that it will take some time for her to see the full benefit of the procedure. She is in enough discomfort now that she thinks that this is the way that she would like to go and I have no argument with her.

## 2014-02-01 NOTE — Anesthesia Postprocedure Evaluation (Signed)
Anesthesia Post Note  Patient: Dawn Kirk  Procedure(s) Performed: Procedure(s) (LRB): POSTERIOR LUMBAR FUSION LUMBAR FIVE-SACRAL ONE INTERBODY FUSION WITH INTERBODY FUSION WITH INTERBODY PROSTHESIS POSTERIOR LATERAL ARTHRODESIS POSTERIOR NONSEGMENTAL INSTRUMENTATION (N/A)  Anesthesia type: general  Patient location: PACU  Post pain: Pain level controlled  Post assessment: Patient's Cardiovascular Status Stable  Last Vitals:  Filed Vitals:   02/01/14 1409  BP: 130/68  Pulse: 87  Temp: 36.8 C  Resp: 20    Post vital signs: Reviewed and stable  Level of consciousness: sedated  Complications: No apparent anesthesia complications

## 2014-02-01 NOTE — Progress Notes (Signed)
Patient admitted to the floor via PACU, patient alert and oriented x4, patient oriented to room.

## 2014-02-02 LAB — GLUCOSE, CAPILLARY
GLUCOSE-CAPILLARY: 133 mg/dL — AB (ref 70–99)
Glucose-Capillary: 132 mg/dL — ABNORMAL HIGH (ref 70–99)
Glucose-Capillary: 137 mg/dL — ABNORMAL HIGH (ref 70–99)
Glucose-Capillary: 114 mg/dL — ABNORMAL HIGH (ref 70–99)

## 2014-02-02 MED ORDER — VORTIOXETINE HBR 20 MG PO TABS
20.0000 mg | ORAL_TABLET | Freq: Every day | ORAL | Status: DC
  Administered 2014-02-02 – 2014-02-04 (×3): 20 mg via ORAL
  Filled 2014-02-02: qty 20

## 2014-02-02 MED ORDER — PNEUMOCOCCAL VAC POLYVALENT 25 MCG/0.5ML IJ INJ
0.5000 mL | INJECTION | INTRAMUSCULAR | Status: AC
  Administered 2014-02-03: 0.5 mL via INTRAMUSCULAR
  Filled 2014-02-02: qty 0.5

## 2014-02-02 NOTE — Transfer of Care (Signed)
Immediate Anesthesia Transfer of Care Note  Patient: Michaelle CopasJanet B Keay  Procedure(s) Performed: Procedure(s) with comments: POSTERIOR LUMBAR FUSION LUMBAR FIVE-SACRAL ONE INTERBODY FUSION WITH INTERBODY FUSION WITH INTERBODY PROSTHESIS POSTERIOR LATERAL ARTHRODESIS POSTERIOR NONSEGMENTAL INSTRUMENTATION (N/A) - Lumbar 5 GILL procedure, POSTERIOR LATERAL ARTHRODESIS POSTERIOR NONSEGMENTAL INSTRUMENTATION  Patient Location: PACU  Anesthesia Type:General  Level of Consciousness: awake, alert , oriented and sedated  Airway & Oxygen Therapy: Patient Spontanous Breathing and Patient connected to nasal cannula oxygen  Post-op Assessment: Report given to PACU RN, Post -op Vital signs reviewed and stable and Patient moving all extremities  Post vital signs: Reviewed and stable  Complications: No apparent anesthesia complications

## 2014-02-02 NOTE — Progress Notes (Signed)
Patient just had an episode of moderate bleeding that resolved within one minute when she tried to sit on the toilet. Honeycomb dressing changed, currently clean,dry and intact.

## 2014-02-02 NOTE — Progress Notes (Signed)
Filed Vitals:   02/01/14 2109 02/02/14 0225 02/02/14 0639 02/02/14 0944  BP: 92/41 99/55 98/42  78/45  Pulse: 79 75 76 77  Temp: 97.8 F (36.6 C) 98.1 F (36.7 C) 98.8 F (37.1 C) 98.2 F (36.8 C)  TempSrc: Oral Oral Oral Oral  Resp: 18 18 18 18   Height:      Weight:      SpO2: 95% 92% 97% 96%    CBC  Recent Labs  02/01/14 0611  WBC 9.3  HGB 14.9  HCT 45.3  PLT 215    Patient resting in bed, patient had staff remove Foley, and has voided since it's been removed. She's been up to the bathroom twice, but has not yet ambulated in the halls.  Some bloody drainage into lower aspect of honeycomb dressing.  Plan: Doing well following surgery yesterday. Encouraged to ambulate in the halls with staff.  Hewitt ShortsNUDELMAN,ROBERT W, MD 02/02/2014, 9:50 AM

## 2014-02-02 NOTE — Evaluation (Signed)
Physical Therapy Evaluation Patient Details Name: KYIA RHUDE MRN: [id number] DOB: [birth year] Today's Date: 02/02/2014   History of Present Illness  Jalen Oberry is a 58 y.o. Female s/p PLIF on 02/01/14.   Clinical Impression  Pt presents with fair mobility limited mostly by concern about bleeding from incision and muscle cramping.  Encouraged multiple walks throughout day, OOB as able.  Will benefit from PT acutely to facilitate return home once medically ready.  Do not anticipate need for therapy postacute.    Follow Up Recommendations No PT follow up;Supervision - Intermittent    Equipment Recommendations  None recommended by PT;Rolling walker with 5" wheels (pt has?)    Recommendations for Other Services       Precautions / Restrictions Precautions Precautions: Back;Fall Precaution Comments: Educated pt on 3/3 back precautions and incorporating into ADLs.  Restrictions Other Position/Activity Restrictions: pt continues to bleed lightly from base of incision, RN aware      Mobility  Bed Mobility Overal bed mobility: Needs Assistance Bed Mobility: Rolling;Sidelying to Sit;Sit to Sidelying Rolling: Modified independent (Device/Increase time) Sidelying to sit: Supervision     Sit to sidelying: Supervision General bed mobility comments: cues for technique and consistency  Transfers Overall transfer level: Needs assistance Equipment used: Rolling walker (2 wheeled) Transfers: Sit to/from Stand Sit to Stand: Supervision         General transfer comment: cues for safety; bed raised to simulate home environmnet  Ambulation/Gait Ambulation/Gait assistance: Supervision Ambulation Distance (Feet): 150 Feet Assistive device: Rolling walker (2 wheeled);None Gait Pattern/deviations: Step-through pattern;Antalgic     General Gait Details: Pt using RW with light grip, changed height, but pt wanted to try without. Able to ambulate without/with IV pole and supervision  second 1/2 of total distance with mild incr in muscle pain/cramping noted.  Educated to use device with nrusing staff, ok to try no device with therapy onle  Stairs            Wheelchair Mobility    Modified Rankin (Stroke Patients Only)       Balance                                             Pertinent Vitals/Pain 'feels like a charley horse' in right hip/back     Home Living Family/patient expects to be discharged to:: Private residence Living Arrangements: Spouse/significant other Available Help at Discharge: Family;Available 24 hours/day Type of Home: House Home Access: Stairs to enter Entrance Stairs-Rails: Can reach both Entrance Stairs-Number of Steps: 5 Home Layout: One level Home Equipment: Shower seat - built in      Prior Function Level of Independence: Independent         Comments: Pt works at [employer] in [employer]. Discussed not sitting for >30-45 minutes and ergonomic work station to allow for spine neutral posture.      Hand Dominance   Dominant Hand: Right    Extremity/Trunk Assessment   Upper Extremity Assessment: Defer to OT evaluation           Lower Extremity Assessment: Generalized weakness;Overall WFL for tasks assessed         Communication   Communication: No difficulties  Cognition Arousal/Alertness: Awake/alert Behavior During Therapy: WFL for tasks assessed/performed Overall Cognitive Status: Within Functional Limits for tasks assessed  General Comments      Exercises        Assessment/Plan    PT Assessment Patient needs continued PT services  PT Diagnosis Difficulty walking;Acute pain   PT Problem List Decreased strength;Decreased range of motion;Decreased activity tolerance;Decreased mobility;Pain;Decreased knowledge of use of DME  PT Treatment Interventions DME instruction;Gait training;Stair training;Functional mobility training;Therapeutic  activities;Patient/family education   PT Goals (Current goals can be found in the Care Plan section) Acute Rehab PT Goals Patient Stated Goal: walk without RW PT Goal Formulation: With patient Time For Goal Achievement: 02/09/14 Potential to Achieve Goals: Good    Frequency Min 5X/week   Barriers to discharge        Co-evaluation               End of Session Equipment Utilized During Treatment: Gait belt Activity Tolerance: Patient tolerated treatment well Patient left: in bed;with call bell/phone within reach Nurse Communication: Mobility status;Patient requests pain meds (bleeding from incision)         Time: 1350-1416 PT Time Calculation (min): 26 min   Charges:   PT Evaluation $Initial PT Evaluation Tier I: 1 Procedure PT Treatments $Gait Training: 8-22 mins   PT G Codes:          Dennis BastMartin, Shontae Rosiles Galloway 02/02/2014, 2:19 PM

## 2014-02-02 NOTE — Progress Notes (Signed)
Occupational Therapy Evaluation and Discharge Patient Details Name: Dawn CopasJanet B Shoberg MRN: [id number] DOB: 15-Jul-1956 Today's Date: 02/02/2014    History of Present Illness Dawn CopasJanet B. Posadas is a 58 y.o. Female s/p PLIF on 02/01/14.    Clinical Impression   PTA pt lived at home with spouse and was independent with ADLs and functional mobility, however long distances were difficult and pt reports that she would lean on the shopping cart when walking. Pt moving slowly this date, however has good technique and overall min guard level with functional mobility. Pt is limited this date by incisional bleeding. Pt will have 24/7 assistance at home and education and training completed for incorporating back precautions into ADLs. No further acute OT needs.     Follow Up Recommendations  No OT follow up;Supervision/Assistance - 24 hour    Equipment Recommendations  None recommended by OT       Precautions / Restrictions Precautions Precautions: Back;Fall Precaution Booklet Issued: Yes (comment) Precaution Comments: Educated pt on 3/3 back precautions and incorporating into ADLs.  Restrictions Weight Bearing Restrictions: No      Mobility Bed Mobility Overal bed mobility: Needs Assistance Bed Mobility: Rolling;Sidelying to Sit;Sit to Sidelying Rolling: Supervision Sidelying to sit: Supervision     Sit to sidelying: Supervision General bed mobility comments: Supervision for safety. Pt with use of bed rails for truncal support off bed. Good technique.   Transfers Overall transfer level: Needs assistance Equipment used: Rolling walker (2 wheeled) Transfers: Sit to/from Stand Sit to Stand: Min guard         General transfer comment: VC's for hand placement but no physical assistance needed.     Balance Overall balance assessment: Needs assistance Sitting-balance support: No upper extremity supported;Feet supported Sitting balance-Leahy Scale: Good     Standing balance support: No  upper extremity supported;During functional activity Standing balance-Leahy Scale: Fair Standing balance comment: Pt able to stand without UE on RW with no LOB or sway.                             ADL Overall ADL's : Needs assistance/impaired Eating/Feeding: Independent;Sitting   Grooming: Min guard;Standing   Upper Body Bathing: Set up;Sitting   Lower Body Bathing: Minimal assistance;Sit to/from stand   Upper Body Dressing : Set up;Sitting   Lower Body Dressing: Minimal assistance;Sit to/from stand   Toilet Transfer: Min guard;Ambulation;RW (sit<>stand)   Toileting- Clothing Manipulation and Hygiene: Minimal assistance;Sit to/from stand Toileting - Clothing Manipulation Details (indicate cue type and reason): Educated pt on use of baby wipes and tongs (or toilet aid) to assist with toilet hygiene while maintaining back precautions.  Tub/ Shower Transfer: Walk-in shower;Min guard;Ambulation;Shower seat;Rolling walker   Functional mobility during ADLs: Min guard;Rolling walker General ADL Comments: Pt moving slowly but well. Able to stand with min guard but will likely progress quickly to Supervision level. Pt ambulated in hallways and was limited due to incisional bleeding (2nd time this morning). Educated pt on incorporating back precautions into ADLs including LB bathing/dressing and toilet hygiene. Pt is able to cross ankle over knee bilaterally to don socks, however is limited by pain and incisional bleeding this date. Pt will have 24/7 assistance at home.      Vision  Pt reports no change from baseline.  No apparent visual deficits.                  Perception Perception Perception Tested?: No  Praxis Praxis Praxis tested?: Within functional limits    Pertinent Vitals/Pain No c/o pain. BP 84/46 and has been low, however pt asymptomatic.      Hand Dominance Right   Extremity/Trunk Assessment Upper Extremity Assessment Upper Extremity Assessment:  Overall WFL for tasks assessed   Lower Extremity Assessment Lower Extremity Assessment: Overall WFL for tasks assessed   Cervical / Trunk Assessment Cervical / Trunk Assessment: Normal   Communication Communication Communication: No difficulties   Cognition Arousal/Alertness: Awake/alert Behavior During Therapy: WFL for tasks assessed/performed Overall Cognitive Status: Within Functional Limits for tasks assessed                                Home Living Family/patient expects to be discharged to:: Private residence Living Arrangements: Spouse/significant other Available Help at Discharge: Family;Available 24 hours/day Type of Home: House Home Access: Stairs to enter [employer] of Steps: 5 Entrance Stairs-Rails: Left;Right Home Layout: One level     Bathroom Shower/Tub: Producer, television/film/video: Standard     Home Equipment: Shower seat - built in          Prior Functioning/Environment Level of Independence: Independent        Comments: Pt works at [employer] in [employer], therapeutic. Discussed not sitting for >30-45 minutes and ergonomic work station to allow for spine neutral posture.                               End of Session Equipment Utilized During Treatment: Gait belt;Rolling walker Nurse Communication: Other (comment) (pt incision bleeding again during ambulation)  Activity Tolerance: Patient tolerated treatment well Patient left: in bed;with call bell/phone within reach;with nursing/sitter in room   Time: 4098-1191 OT Time Calculation (min): 23 min Charges:  OT General Charges $OT Visit: 1 Procedure OT Evaluation $Initial OT Evaluation Tier I: 1 Procedure OT Treatments $Self Care/Home Management : 8-22 mins  Rae Lips [phone] 02/02/2014, 10:34 AM

## 2014-02-02 NOTE — Progress Notes (Signed)
Utilization Review Completed.Dawn Kirk T7/06/2014  

## 2014-02-03 ENCOUNTER — Encounter (HOSPITAL_COMMUNITY): Payer: Self-pay | Admitting: Neurosurgery

## 2014-02-03 LAB — GLUCOSE, CAPILLARY
GLUCOSE-CAPILLARY: 141 mg/dL — AB (ref 70–99)
GLUCOSE-CAPILLARY: 132 mg/dL — AB (ref 70–99)
Glucose-Capillary: 192 mg/dL — ABNORMAL HIGH (ref 70–99)
Glucose-Capillary: 123 mg/dL — ABNORMAL HIGH (ref 70–99)

## 2014-02-03 MED ORDER — TRIAMTERENE-HCTZ 37.5-25 MG PO TABS
1.0000 | ORAL_TABLET | Freq: Every day | ORAL | Status: DC
  Filled 2014-02-03 (×3): qty 1

## 2014-02-03 NOTE — Progress Notes (Signed)
Physical Therapy Treatment Patient Details Name: Dawn Kirk MRN: [id number] DOB: 1956-02-01 Today's Date: 02/03/2014    History of Present Illness Dawn Kirk is a 58 y.o. Female s/p PLIF on 02/01/14.     PT Comments    Pt mobility greatly limited this date by "bilat sciatica pain" focused on supine piriformis stretch. Pt reports decrease in pain from 10/10 to 7/10 post stretches. Advised pt to use RW to amb to provide support to back due to weakness as pt reported amb without RW yesterday and now has sciatica pain. Pt encouraged to amb with RN staff later today once pain subsides. Discussed back precautions and pt with good recall. Spoke with RN re: pt's sciatica pain.  Anticipate pt will still progress well enough to d/c home once medical stable and pain under control.   Follow Up Recommendations  No PT follow up;Supervision - Intermittent     Equipment Recommendations  Rolling walker with 5" wheels    Recommendations for Other Services       Precautions / Restrictions Precautions Precautions: Back;Fall Precaution Booklet Issued: Yes (comment) Precaution Comments: pt able to recall but required v/c's to minimalize twisting during rolling Restrictions Weight Bearing Restrictions: No    Mobility  Bed Mobility Overal bed mobility: Modified Independent Bed Mobility: Rolling Rolling: Modified independent (Device/Increase time)         General bed mobility comments: increased time, v/c's to not reach back with arm in opposite direction of LEs  Transfers                 General transfer comment: deferred due to bilat LE pain  Ambulation/Gait             General Gait Details: deferred due to bilat LE pain   Stairs            Wheelchair Mobility    Modified Rankin (Stroke Patients Only)       Balance                                    Cognition Arousal/Alertness: Awake/alert Behavior During Therapy: WFL for tasks  assessed/performed Overall Cognitive Status: Within Functional Limits for tasks assessed                      Exercises Other Exercises Other Exercises: completed bilat piriformis stretch in supine. Completed 3 stretches bilat with 1-2 min hold. Instructed on how to complete in sitting as well    General Comments        Pertinent Vitals/Pain 10+/10 bilat LE pain upon PT arrival, 7/10 pain s/p stretches    Home Living                      Prior Function            PT Goals (current goals can now be found in the care plan section) Acute Rehab PT Goals Patient Stated Goal: stop hurting Progress towards PT goals: Progressing toward goals    Frequency  Min 5X/week    PT Plan Current plan remains appropriate    Co-evaluation             End of Session           Time: 4098-1191 PT Time Calculation (min): 33 min  Charges:  $Therapeutic Exercise: 8-22 mins $Therapeutic Activity: 8-22 mins  G CodesMarcene Brawn:      Dawn Kirk 02/03/2014, 11:04 AM  Lewis ShockAshly Kelsie Kramp, PT, DPT Pager #: [phone] Office #: [phone]

## 2014-02-03 NOTE — Progress Notes (Signed)
Patient ID: Dawn Kirk, female   DOB: October 30, 1955, 58 y.o.   MRN: [id number] BP 92/44  Pulse 71  Temp(Src) 99.6 F (37.6 C) (Oral)  Resp 18  Ht 5\' 4"  (1.626 m)  Wt 119.251 kg (262 lb 14.4 oz)  BMI 45.10 kg/m2  SpO2 96% Alert and oriented x 4 Moving lower extremities well Wound is bloody, will change dressing May use the valium. No topicals.

## 2014-02-04 LAB — GLUCOSE, CAPILLARY
GLUCOSE-CAPILLARY: 156 mg/dL — AB (ref 70–99)
Glucose-Capillary: 120 mg/dL — ABNORMAL HIGH (ref 70–99)
Glucose-Capillary: 112 mg/dL — ABNORMAL HIGH (ref 70–99)
Glucose-Capillary: 128 mg/dL — ABNORMAL HIGH (ref 70–99)

## 2014-02-04 MED ORDER — CYCLOBENZAPRINE HCL 10 MG PO TABS: 10.0000 mg | ORAL_TABLET | Freq: Three times a day (TID) | ORAL | Status: DC | PRN

## 2014-02-04 MED ORDER — OXYCODONE-ACETAMINOPHEN 5-325 MG PO TABS: 1.0000 | ORAL_TABLET | Freq: Four times a day (QID) | ORAL | Status: DC | PRN

## 2014-02-04 NOTE — Discharge Summary (Signed)
  BP 95/55  Pulse 70  Temp(Src) 98.4 F (36.9 C) (Oral)  Resp 18  Ht 5\' 4"  (1.626 m)  Wt 119.251 kg (262 lb 14.4 oz)  BMI 45.10 kg/m2  SpO2 94% 02/01/2014 Admitting DX: Spondylolisthesis Grade 2 L5/S1, spondylolysis L5 Discharge WU:JWJXx:same Surgeon:Tanea Moga Procedure:L5 Gill procedure Posterolateral arthrodesis L5-S1, morselized allograft Non-segmental pedicle screw fixation Medtronic solera Interbody arthrodesis morselized autograft  Status:alive, well Discharge dest:home Physical Exam:Physical Exam  Constitutional: She is oriented to person, place, and time. She appears well-developed and well-nourished. No distress.  HENT:  Head: Normocephalic and atraumatic.  Eyes: EOM are normal. Pupils are equal, round, and reactive to light.  Neck: Normal range of motion. Neck supple.  Cardiovascular: Normal rate and regular rhythm.   Pulmonary/Chest: Effort normal and breath sounds normal.  Abdominal: Soft. Bowel sounds are normal.  Musculoskeletal: Normal range of motion.  Neurological: She is alert and oriented to person, place, and time. She has normal reflexes. She displays normal reflexes. No cranial nerve deficit. She exhibits normal muscle tone. Coordination normal.  Skin: Skin is warm and dry.  Psychiatric: She has a normal mood and affect. Her behavior is normal. Judgment and thought content normal.  wound is clean, dry, without signs of infection  Discharge Medications:percocet, flexeril

## 2014-02-04 NOTE — Progress Notes (Signed)
Pt has orders to be discharged, discharge instructions given and explained to patient.  Pt transported from unit via wheelchair. Pt alert and oriented upon exit. No signs or symptoms of acute distress. Delfino Lovettichie Tedrick Port, RN, BSN 02/04/2014 11:49 PM

## 2014-02-04 NOTE — Progress Notes (Signed)
Physical Therapy Treatment Patient Details Name: Dawn Kirk MRN: [id number] DOB: 07-28-1955 Today's Date: 02/04/2014    History of Present Illness Dawn Kirk is a 58 y.o. Female s/p PLIF on 02/01/14.     PT Comments    Pt with decreased sciatica pain compared to yesterday however amb tolerance remains limited by onset of bilat hip pain. Pt able to safely amb with RW and demo'd good stair negotiation for safe entrance into home. Pt activity tolerance remains limited by pain however is improving. PT to con't to follow.   Follow Up Recommendations  No PT follow up;Supervision - Intermittent     Equipment Recommendations  Rolling walker with 5" wheels    Recommendations for Other Services       Precautions / Restrictions Precautions Precautions: Back;Fall Precaution Booklet Issued: Yes (comment) Precaution Comments: re-educated on back prec, re-introduced handout Restrictions Weight Bearing Restrictions: No    Mobility  Bed Mobility Overal bed mobility: Modified Independent Bed Mobility: Rolling;Sidelying to Sit Rolling: Modified independent (Device/Increase time) Sidelying to sit: Supervision       General bed mobility comments: increased time, definite use of hands  Transfers Overall transfer level: Needs assistance Equipment used: Rolling walker (2 wheeled) Transfers: Sit to/from Stand Sit to Stand: Supervision         General transfer comment: increased time, good hand placemet  Ambulation/Gait Ambulation/Gait assistance: Supervision Ambulation Distance (Feet): 100 Feet Assistive device: Rolling walker (2 wheeled);None Gait Pattern/deviations: Step-through pattern;Decreased stride length     General Gait Details: increased bilat UE WBing, pt limited by pain in hips   Stairs            Wheelchair Mobility    Modified Rankin (Stroke Patients Only)       Balance               Standing balance comment: needs RW for amb                     Cognition Arousal/Alertness: Awake/alert Behavior During Therapy: WFL for tasks assessed/performed Overall Cognitive Status: Within Functional Limits for tasks assessed                      Exercises Other Exercises Other Exercises: pt return demo'd good piriformis stretching    General Comments        Pertinent Vitals/Pain 7/10 bilat hip pain    Home Living                      Prior Function            PT Goals (current goals can now be found in the care plan section) Acute Rehab PT Goals Patient Stated Goal: home today Progress towards PT goals: Progressing toward goals    Frequency  Min 5X/week    PT Plan Current plan remains appropriate    Co-evaluation             End of Session Equipment Utilized During Treatment: Gait belt Activity Tolerance: Patient tolerated treatment well Patient left: in chair;with call bell/phone within reach     Time: 0750-0817 PT Time Calculation (min): 27 min  Charges:  $Gait Training: 8-22 mins $Therapeutic Activity: 8-22 mins                    G Codes:      Marcene BrawnChadwell, Duston Smolenski Marie 02/04/2014, 8:28 AM  Lewis ShockAshly Ayerim Berquist, PT, DPT Pager #:  [phone] Office #: [phone]

## 2014-02-04 NOTE — Discharge Instructions (Signed)

## 2014-02-05 ENCOUNTER — Encounter (HOSPITAL_COMMUNITY): Payer: Self-pay | Admitting: Neurosurgery

## 2014-02-13 NOTE — Progress Notes (Signed)
Patient ID: Dawn Kirk, female   DOB: 1955/09/06, 58 y.o.   MRN: [id number] ATTENDING PHYSICIAN NOTE: I have reviewed the chart and agree with the plan as detailed above. Denny LevySara Neal MD Pager [phone]

## 2014-03-25 ENCOUNTER — Ambulatory Visit (INDEPENDENT_AMBULATORY_CARE_PROVIDER_SITE_OTHER): Payer: Self-pay | Admitting: Family Medicine

## 2014-03-25 VITALS — BP 103/69 | HR 76 | Wt 244.0 lb

## 2014-03-25 DIAGNOSIS — E119 Type 2 diabetes mellitus without complications: Secondary | ICD-10-CM

## 2014-03-25 DIAGNOSIS — E7800 Pure hypercholesterolemia, unspecified: Secondary | ICD-10-CM

## 2014-03-25 DIAGNOSIS — I10 Essential (primary) hypertension: Secondary | ICD-10-CM

## 2014-03-25 NOTE — Progress Notes (Signed)
Patient presents for 3 mo f/u DM as part of the employee sponsored Link to [employer]. Medications have been reviewed. I have also discussed with patient lifestyle interventions such as diet and exercise. Full documentation of this visit can be found in the [employer] documenting sytem through [employer] [facility]). However specifics from this visit include the following:  Diabetes Mellitus:   POC A1C 6.6, at goal. Patient has decreased her metform from 3 tabs/day to 1 tab/day. She said she just hasn't been able to remember the evening doses. She's been out of work after surgery and hasn't gotten a good routine going. she has lost 8 lb, however, so cutting down on the metformin has not hurt her A1C as of right now. plan 1.) continue metformin 500 mg daily. Patient has stopped testing blood sugar but will test 2x/week to make sure blood sugar is not creeping back up. 2.) will continue to work on weight loss 3.) f/u 3 mo Hyperlipidemia: on appropriately dosed statin  Hypertension: at goal on triamterene/hctz  Patient has set a series of personal goals and will f/u in 3 mo for further review of DM

## 2014-05-08 DIAGNOSIS — I10 Essential (primary) hypertension: Secondary | ICD-10-CM | POA: Insufficient documentation

## 2014-05-08 DIAGNOSIS — E119 Type 2 diabetes mellitus without complications: Secondary | ICD-10-CM | POA: Insufficient documentation

## 2014-05-08 DIAGNOSIS — E7800 Pure hypercholesterolemia, unspecified: Secondary | ICD-10-CM | POA: Insufficient documentation

## 2014-05-08 NOTE — Progress Notes (Signed)
Patient ID: Dawn CopasJanet B Kirk, female   DOB: [birth year], 58 y.o.   MRN: [id number] Reviewed: Agree with the documentation and management of our [facility] Pharmacologist.

## 2014-07-11 ENCOUNTER — Ambulatory Visit (INDEPENDENT_AMBULATORY_CARE_PROVIDER_SITE_OTHER): Payer: Self-pay | Admitting: Family Medicine

## 2014-07-11 VITALS — BP 120/75 | HR 68 | Wt 249.0 lb

## 2014-07-11 DIAGNOSIS — E119 Type 2 diabetes mellitus without complications: Secondary | ICD-10-CM

## 2014-07-11 NOTE — Progress Notes (Signed)
Patient presents for 3 mo f/u DM as part of the employee sponsored [employer] to [employer]. Medications and glucose readings have been reviewed. I have also discussed with patient lifestyle interventions such as diet and exercise. Full documentation of this visit can be found in the caretracker documenting system through [facility] However, specifics of this visit include the following:  Diabetes Mellitus:POC A1C 6.4, at goal. patient has gone back up to regular metformin dose of 3 tab/day. Last time she had stopped 2 of the tabs on her own. Her blood sugar started going high so she went back to her regular dose. No recommended changes at this time. Will f/u when I get back from maternity leave.  Hyperlipidemia: on crestor-at goal  Hypertension:BP at goal on triam/hctz   Misc: Patient still smoking. She does not want to quit at this time. Will continue to ask at every visit.  Patient has set a series of personal goals and will f/u in 3 mo for further review of DM

## 2014-08-05 ENCOUNTER — Other Ambulatory Visit (HOSPITAL_COMMUNITY): Payer: Self-pay | Admitting: Neurosurgery

## 2014-08-05 DIAGNOSIS — M4310 Spondylolisthesis, site unspecified: Secondary | ICD-10-CM

## 2014-08-07 ENCOUNTER — Ambulatory Visit (HOSPITAL_COMMUNITY): Payer: 59

## 2014-08-07 NOTE — Progress Notes (Signed)
Patient ID: Dawn CopasJanet B Snodgrass, female   DOB: 03/23/1956, 59 y.o.   MRN: [id number] Reviewed: Agree with the documentation and management of our [facility] Pharmacologist.

## 2015-01-19 ENCOUNTER — Other Ambulatory Visit (HOSPITAL_COMMUNITY): Payer: Self-pay | Admitting: Internal Medicine

## 2015-01-19 ENCOUNTER — Other Ambulatory Visit: Payer: Self-pay

## 2015-01-19 DIAGNOSIS — Z1231 Encounter for screening mammogram for malignant neoplasm of breast: Secondary | ICD-10-CM

## 2015-01-26 ENCOUNTER — Emergency Department (INDEPENDENT_AMBULATORY_CARE_PROVIDER_SITE_OTHER)
Admission: EM | Admit: 2015-01-26 | Discharge: 2015-01-26 | Disposition: A | Discharge status: Home / Self Care | Payer: 59 | Source: Home / Self Care | Attending: Family Medicine | Admitting: Family Medicine

## 2015-01-26 ENCOUNTER — Encounter (HOSPITAL_COMMUNITY): Payer: Self-pay | Admitting: Emergency Medicine

## 2015-01-26 DIAGNOSIS — J45901 Unspecified asthma with (acute) exacerbation: Secondary | ICD-10-CM | POA: Diagnosis not present

## 2015-01-26 MED ORDER — IPRATROPIUM-ALBUTEROL 0.5-2.5 (3) MG/3ML IN SOLN
3.0000 mL | Freq: Once | RESPIRATORY_TRACT | Status: AC
  Administered 2015-01-26: 3 mL via RESPIRATORY_TRACT

## 2015-01-26 MED ORDER — IPRATROPIUM-ALBUTEROL 0.5-2.5 (3) MG/3ML IN SOLN
RESPIRATORY_TRACT | Status: AC
  Filled 2015-01-26: qty 3

## 2015-01-26 MED ORDER — PREDNISONE 20 MG PO TABS: 20.0000 mg | ORAL_TABLET | Freq: Every day | ORAL | Status: DC

## 2015-01-26 MED ORDER — ALBUTEROL SULFATE HFA 108 (90 BASE) MCG/ACT IN AERS: 1.0000 | INHALATION_SPRAY | Freq: Four times a day (QID) | RESPIRATORY_TRACT | Status: DC | PRN

## 2015-01-26 NOTE — ED Provider Notes (Signed)
CSN: [id number]     Arrival date & time 01/26/15  1311 History   First MD Initiated Contact with Patient 01/26/15 1353     Chief Complaint  Patient presents with  . URI   (Consider location/radiation/quality/duration/timing/severity/associated sxs/prior Treatment) Patient is a 59 y.o. female presenting with cough.  Cough Cough characteristics:  Non-productive Severity:  Moderate Onset quality:  Gradual (Started 3 days ago) Timing:  Constant Progression:  Worsening Chronicity:  New Smoker: yes   Context: animal exposure   Context: not sick contacts   Context comment:  Two dogs at home she had for years. Relieved by:  Beta-agonist inhaler (Tessalon pearls helps some but it makes her sleepy) Associated symptoms: rhinorrhea, sinus congestion, sore throat and wheezing   Associated symptoms: no ear pain, no eye discharge, no fever, no headaches, no rash and no shortness of breath   Associated symptoms comment:  Chest pain with coughing, feels like it's burning in her chest.   Past Medical History  Diagnosis Date  . Anxiety   . Depression   . Hypertension   . GERD (gastroesophageal reflux disease)   . Arthritis   . Asthma     related to bronchitis-no recent issues  . PONV (postoperative nausea and vomiting)   . Diabetes mellitus   . Peripheral neuropathy   . Psoriasis   . Hypercholesterolemia   . NWGNFAOZ(308.6)    Past Surgical History  Procedure Laterality Date  . Cesarean section  1984  . Tubal ligation  1989  . Neck surgery  x2-2004  . Cholecystectomy  2003  . Replacement total knee bilateral  2007    [facility], Dr. Romeo Apple  . Colonoscopy  2009  . Foot surgery  2006  . Endometrial ablation  2006  . Wrist arthroscopy  07/08/2011    Procedure: ARTHROSCOPY WRIST;  Surgeon: Marlowe Shores, MD;  Location: [facility];  Service: Orthopedics;  Laterality: Right;  right wrist with ganglionectomy,   . Steriod injection  07/08/2011    Procedure: STEROID  INJECTION;  Surgeon: Marlowe Shores, MD;  Location: [facility];  Service: Orthopedics;  Laterality: Left;  injection 1ml celestone left index finger  . Cardiac catheterization  2011-[facility]  . Hammer toe surgery  12/01/2011    Procedure: HAMMER TOE CORRECTION;  Surgeon: Dallas Schimke, DPM;  Location: [facility];  Service: Orthopedics;  Laterality: Right;  Arthroplasty 5th Digit Right Foot  . Posterior lumbar fusion N/A 02/01/2014    Procedure: POSTERIOR LUMBAR FUSION LUMBAR FIVE-SACRAL ONE INTERBODY FUSION WITH INTERBODY FUSION WITH INTERBODY PROSTHESIS POSTERIOR LATERAL ARTHRODESIS POSTERIOR NONSEGMENTAL INSTRUMENTATION;  Surgeon: Carmela Hurt, MD;  Location: [facility];  Service: Neurosurgery;  Laterality: N/A;  Lumbar 5 GILL procedure, POSTERIOR LATERAL ARTHRODESIS POSTERIOR NONSEGMENTAL INSTRUMENTATION   Family History  Problem Relation Age of Onset  . Asthma Other   . COPD Other   . Hypertension Other   . Stroke Other   . Heart disease Other    History  Substance Use Topics  . Smoking status: Current Every Day Smoker -- 0.50 packs/day for 40 years    Types: Cigarettes  . Smokeless tobacco: Never Used  . Alcohol Use: No   OB History    No data available     Review of Systems  Unable to perform ROS Constitutional: Negative for fever.  HENT: Positive for rhinorrhea and sore throat. Negative for ear pain.   Eyes: Negative for discharge.  Respiratory: Positive for cough and wheezing.  Negative for shortness of breath.   Skin: Negative for rash.  Neurological: Negative for headaches.  All other systems reviewed and are negative.   Allergies  Codeine; Levofloxacin; and Adhesive  Home Medications   Prior to Admission medications   Medication Sig Start Date End Date Taking? Authorizing Provider  aspirin EC 81 MG tablet Take 81 mg by mouth every morning.   Yes Historical Provider, MD  esomeprazole (NEXIUM) 40 MG capsule Take 40 mg by mouth daily before  breakfast.   Yes Historical Provider, MD  rosuvastatin (CRESTOR) 5 MG tablet Take 5 mg by mouth at bedtime.    Yes Historical Provider, MD  SitaGLIPtin-MetFORMIN HCl [phone] MG TB24 Take by mouth.   Yes Historical Provider, MD  traZODone (DESYREL) 50 MG tablet Take 50 mg by mouth at bedtime.   Yes Historical Provider, MD  triamterene-hydrochlorothiazide (DYAZIDE) 37.5-25 MG per capsule Take 1 capsule by mouth every morning.     Yes Historical Provider, MD  Vortioxetine HBr (BRINTELLIX) 20 MG TABS Take 20 mg by mouth daily.    Yes Historical Provider, MD  albuterol (PROVENTIL HFA;VENTOLIN HFA) 108 (90 BASE) MCG/ACT inhaler Inhale 2 puffs into the lungs every 6 (six) hours as needed for wheezing or shortness of breath.    Historical Provider, MD  metFORMIN (GLUCOPHAGE) 500 MG tablet Take 500 mg by mouth 3 (three) times daily. Takes 1 in the morning and 2 in the evening    Historical Provider, MD  potassium chloride SA (K-DUR,KLOR-CON) 20 MEQ tablet Take 20 mEq by mouth daily as needed (for cramping).     Historical Provider, MD   BP 126/85 mmHg  Pulse 86  Temp(Src) 98.1 F (36.7 C) (Oral)  Resp 18  SpO2 94% Physical Exam  Constitutional: She appears well-developed and well-nourished. No distress.  Cardiovascular: Normal rate, regular rhythm and normal heart sounds.   No murmur heard. Pulmonary/Chest: Effort normal. No respiratory distress. She has wheezes in the right upper field, the right middle field, the right lower field, the left upper field, the left middle field and the left lower field.   She exhibits no tenderness.  Abdominal: Soft. She exhibits no distension. There is no tenderness.  Nursing note and vitals reviewed.   ED Course  Procedures (including critical care time) Nebulization treatment.  Labs Review Labs Reviewed - No data to display  Imaging Review No results found.   MDM  No diagnosis found.  Assessment: Mild Asthma Exacerbation.  Plan: Treated with  Duoneb inhalation.           Repeat pulmonary exam with resolved wheezing.           Patient endorsed improved symptoms.           She does have nebulizer and proventil inhaler at home. Advised to use Q4-6 hrs as needed.           Prednisone prescribed for 10 days.           Follow up with PCP for Asthma management.           Return precaution discussed.    Doreene ElandKehinde T Neziah Vogelgesang, MD 01/26/15 2108

## 2015-01-26 NOTE — ED Notes (Signed)
C/o cold sx onset 4 days Sx include dry cough, facial pressure, ST, wheezing, chest d/c Denies fevers, chills Smokes 1 PPD Alert, no signs of acute distress.

## 2015-01-26 NOTE — Discharge Instructions (Signed)

## 2015-01-29 ENCOUNTER — Ambulatory Visit (HOSPITAL_COMMUNITY)
Admission: RE | Admit: 2015-01-29 | Discharge: 2015-01-29 | Disposition: A | Discharge status: Home / Self Care | Payer: 59 | Source: Ambulatory Visit | Attending: Internal Medicine | Admitting: Internal Medicine

## 2015-01-29 DIAGNOSIS — Z1231 Encounter for screening mammogram for malignant neoplasm of breast: Secondary | ICD-10-CM | POA: Diagnosis not present

## 2015-03-02 ENCOUNTER — Other Ambulatory Visit (HOSPITAL_COMMUNITY): Payer: Self-pay | Admitting: Internal Medicine

## 2015-03-02 ENCOUNTER — Ambulatory Visit (HOSPITAL_COMMUNITY)
Admission: RE | Admit: 2015-03-02 | Discharge: 2015-03-02 | Disposition: A | Discharge status: Home / Self Care | Payer: 59 | Source: Ambulatory Visit | Attending: Internal Medicine | Admitting: Internal Medicine

## 2015-03-02 DIAGNOSIS — R1030 Lower abdominal pain, unspecified: Secondary | ICD-10-CM | POA: Insufficient documentation

## 2015-03-02 DIAGNOSIS — R310 Gross hematuria: Secondary | ICD-10-CM

## 2015-03-02 DIAGNOSIS — R932 Abnormal findings on diagnostic imaging of liver and biliary tract: Secondary | ICD-10-CM | POA: Insufficient documentation

## 2015-03-02 DIAGNOSIS — K5730 Diverticulosis of large intestine without perforation or abscess without bleeding: Secondary | ICD-10-CM | POA: Diagnosis not present

## 2015-03-02 DIAGNOSIS — R938 Abnormal findings on diagnostic imaging of other specified body structures: Secondary | ICD-10-CM | POA: Insufficient documentation

## 2015-03-10 ENCOUNTER — Ambulatory Visit (INDEPENDENT_AMBULATORY_CARE_PROVIDER_SITE_OTHER): Payer: Self-pay | Admitting: Family Medicine

## 2015-03-10 ENCOUNTER — Ambulatory Visit: Payer: 59

## 2015-03-10 VITALS — BP 117/87 | HR 86 | Wt 240.0 lb

## 2015-03-10 DIAGNOSIS — E119 Type 2 diabetes mellitus without complications: Secondary | ICD-10-CM

## 2015-03-10 LAB — POCT GLYCOSYLATED HEMOGLOBIN (HGB A1C): Hemoglobin A1C: 6.1

## 2015-03-10 NOTE — Progress Notes (Signed)
Patient presents for pharmacy diabetes follow up as part of the employee sponsored Link to [employer]. Medications have been reviewed. I have also discussed with patient lifetstyle interventions such as diet and exercise.   Diabetes-POC A1C 6.1, at goal no recommended medication changes. Provider patient new meter today and faxed to get new test strips. Will contact patient when I get them. Hypertension-controlled and at goal today Lipids-on appropriately dosed statin  Patient has set a series of personal goals and will f/u in 3 mo for further review of dm

## 2015-04-01 NOTE — Progress Notes (Signed)
ATTENDING PHYSICIAN NOTE: I have reviewed the chart and agree with the plan as detailed above. Maude Gloor MD Pager [phone]  

## 2015-04-14 IMAGING — CR DG LUMBAR SPINE 1V
1 series · 1 of 1 positions shown · non-contrast
Comparison: MRI lumbar spine 01/06/2014

FLUOROSCOPY TIME:  1 min 43 seconds

CLINICAL DATA: Lumbar spine surgery, PLIF L5-S1

EXAM:
LUMBAR SPINE - 1 VIEW; DG C-ARM 61-120 MIN

[xtable lateral]
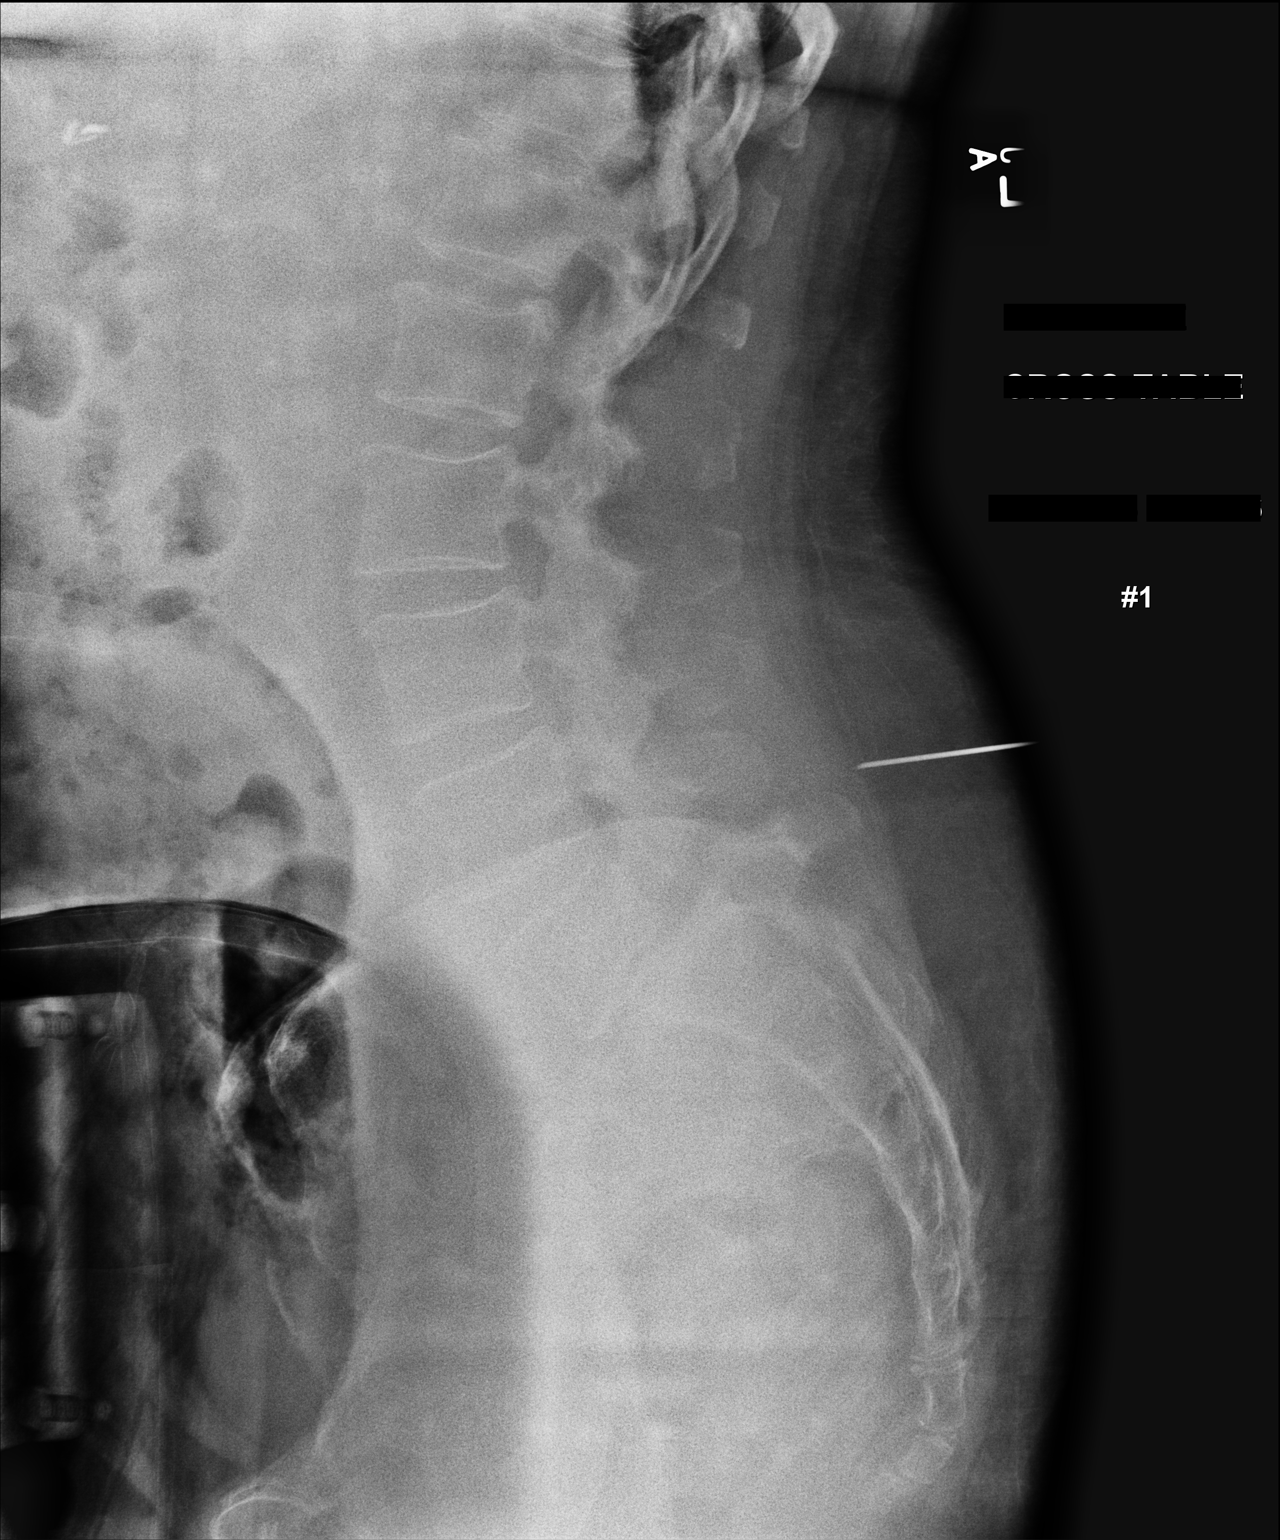

[1 of 1 positions shown; findings below may reference images not displayed]

FINDINGS: Portable cross-table lateral view at 1136 hr.

Five lumbar vertebrae labeled on prior MR.

Metallic probe via dorsal approach projects dorsal to the L4-L5 disc
space, cranial to the spinous process of L5.

Again identified grade 2 spondylolisthesis L5-S1 and osseous
demineralization.
IMPRESSION: Posterior localization of the L4-L5 disc space level.

## 2015-06-22 ENCOUNTER — Ambulatory Visit: Payer: Self-pay

## 2015-06-22 NOTE — Progress Notes (Unsigned)
Subjective:  Patient presents today for 3 month diabetes follow-up as part of the employer-sponsored Link to Wellness program. Current diabetes regimen includes janumet  xr. Patient also continues on daily ASA and statin.  No med changes or major health changes at this time.    Assessment:  Diabetes: Most recent A1C was 6% which is at goal of less than 7%. Weight is stable from last visit with me.    Physical Activity- none  Nutrition- patient states she is portion controlling better   Follow up with me in 3 months.    Plan/Goals for Next Visit:  1. Patient will work on diet more since she is unable to exercise with bad back/knees.    Next appointment to see me is: febuary 2017

## 2015-07-29 ENCOUNTER — Telehealth: Payer: 59 | Admitting: Family

## 2015-07-29 DIAGNOSIS — J309 Allergic rhinitis, unspecified: Secondary | ICD-10-CM | POA: Diagnosis not present

## 2015-07-29 MED ORDER — FLUTICASONE PROPIONATE 50 MCG/ACT NA SUSP: 2.0000 | Freq: Every day | NASAL | Status: DC

## 2015-07-29 MED FILL — FLUTICASONE PROP 50 MCG SPR: 50 | 30 days supply | Qty: 16 | Fill #0

## 2015-07-29 NOTE — Progress Notes (Signed)
We are sorry that you are not feeling well.  Here is how we plan to help!  Based on what you have shared with me it looks like you have sinusitis.  Sinusitis is inflammation and infection in the sinus cavities of the head.  Based on your presentation I believe you most likely have Acute Viral Sinusitis.This is an infection most likely caused by a virus. There is not specific treatment for viral sinusitis other than to help you with the symptoms until the infection runs its course.  You may use an oral decongestant such as Mucinex D or if you have glaucoma or high blood pressure use plain Mucinex. Saline nasal spray help and can safely be used as often as needed for congestion, I have prescribed: Fluticasone nasal spray two sprays in each nostril twice a day  Some authorities believe that zinc sprays or th2e use of Echinacea may shorten the course of your symptoms.  Sinus infections are not as easily transmitted as other respiratory infection, however we still recommend that you avoid close contact with loved ones, especially the very young and elderly.  Remember to wash your hands thoroughly throughout the day as this is the number one way to prevent the spread of infection!  Home Care:  Only take medications as instructed by your medical team.  Complete the entire course of an antibiotic.  Do not take these medications with alcohol.  A steam or ultrasonic humidifier can help congestion.  You can place a towel over your head and breathe in the steam from hot water coming from a faucet.  Avoid close contacts especially the very young and the elderly.  Cover your mouth when you cough or sneeze.  Always remember to wash your hands.  Get Help Right Away If:  You develop worsening fever or sinus pain.  You develop a severe head ache or visual changes.  Your symptoms persist after you have completed your treatment plan.  Make sure you  Understand these instructions.  Will watch your  condition.  Will get help right away if you are not doing well or get worse.  Your e-visit answers were reviewed by a board certified advanced clinical practitioner to complete your personal care plan.  Depending on the condition, your plan could have included both over the counter or prescription medications.  If there is a problem please reply  once you have received a response from your provider.  Your safety is important to us.  If you have drug allergies check your prescription carefully.    You can use MyChart to ask questions about today's visit, request a non-urgent call back, or ask for a work or school excuse for 24 hours related to this e-Visit. If it has been greater than 24 hours you will need to follow up with your provider, or enter a new e-Visit to address those concerns.  You will get an e-mail in the next two days asking about your experience.  I hope that your e-visit has been valuable and will speed your recovery. Thank you for using e-visits.

## 2015-08-06 MED FILL — traZODone HCL 50 MG TABS: 50 | 30 days supply | Qty: 30 | Fill #2

## 2015-08-07 MED FILL — TRINTELLIX 20 MG TABLET: 20 | 30 days supply | Qty: 30 | Fill #3

## 2015-08-07 MED FILL — JANUMET XR 100-1,000 MG TAB: 100-1000 | 30 days supply | Qty: 30 | Fill #2

## 2015-09-04 DIAGNOSIS — E1141 Type 2 diabetes mellitus with diabetic mononeuropathy: Secondary | ICD-10-CM | POA: Diagnosis not present

## 2015-09-04 DIAGNOSIS — E782 Mixed hyperlipidemia: Secondary | ICD-10-CM | POA: Diagnosis not present

## 2015-09-07 MED FILL — TRIAMTERENE/HCTZ 37.5/25 CP: 37.5-25 | 90 days supply | Qty: 90 | Fill #0

## 2015-09-07 MED FILL — JANUMET XR 100-1,000 MG TAB: 100-1000 | 30 days supply | Qty: 30 | Fill #3

## 2015-09-07 MED FILL — traZODone HCL 50 MG TABS: 50 | 30 days supply | Qty: 30 | Fill #0

## 2015-09-07 MED FILL — TRINTELLIX 20 MG TABLET: 20 | 30 days supply | Qty: 30 | Fill #4

## 2015-09-07 MED FILL — ROSUVASTATIN CALCIUM 5 MG T: 5 | 90 days supply | Qty: 90 | Fill #0

## 2015-09-08 DIAGNOSIS — F411 Generalized anxiety disorder: Secondary | ICD-10-CM | POA: Diagnosis not present

## 2015-09-08 DIAGNOSIS — E1141 Type 2 diabetes mellitus with diabetic mononeuropathy: Secondary | ICD-10-CM | POA: Diagnosis not present

## 2015-09-08 DIAGNOSIS — Z720 Tobacco use: Secondary | ICD-10-CM | POA: Diagnosis not present

## 2015-09-08 DIAGNOSIS — K5900 Constipation, unspecified: Secondary | ICD-10-CM | POA: Diagnosis not present

## 2015-09-08 DIAGNOSIS — M25551 Pain in right hip: Secondary | ICD-10-CM | POA: Diagnosis not present

## 2015-09-08 DIAGNOSIS — F339 Major depressive disorder, recurrent, unspecified: Secondary | ICD-10-CM | POA: Diagnosis not present

## 2015-09-08 DIAGNOSIS — G589 Mononeuropathy, unspecified: Secondary | ICD-10-CM | POA: Diagnosis not present

## 2015-09-08 DIAGNOSIS — E782 Mixed hyperlipidemia: Secondary | ICD-10-CM | POA: Diagnosis not present

## 2015-09-08 DIAGNOSIS — R944 Abnormal results of kidney function studies: Secondary | ICD-10-CM | POA: Diagnosis not present

## 2015-09-09 ENCOUNTER — Ambulatory Visit (INDEPENDENT_AMBULATORY_CARE_PROVIDER_SITE_OTHER): Payer: 59

## 2015-09-09 ENCOUNTER — Encounter: Payer: Self-pay | Admitting: Orthopedic Surgery

## 2015-09-09 ENCOUNTER — Ambulatory Visit (INDEPENDENT_AMBULATORY_CARE_PROVIDER_SITE_OTHER): Payer: 59 | Admitting: Orthopedic Surgery

## 2015-09-09 VITALS — BP 120/71 | Ht 64.0 in | Wt 236.0 lb

## 2015-09-09 DIAGNOSIS — M4806 Spinal stenosis, lumbar region: Secondary | ICD-10-CM | POA: Diagnosis not present

## 2015-09-09 DIAGNOSIS — M961 Postlaminectomy syndrome, not elsewhere classified: Secondary | ICD-10-CM

## 2015-09-09 DIAGNOSIS — M25551 Pain in right hip: Secondary | ICD-10-CM

## 2015-09-09 DIAGNOSIS — M5441 Lumbago with sciatica, right side: Secondary | ICD-10-CM

## 2015-09-09 DIAGNOSIS — M48062 Spinal stenosis, lumbar region with neurogenic claudication: Secondary | ICD-10-CM

## 2015-09-09 NOTE — Progress Notes (Addendum)
Patient ID: Dawn Kirk, female   DOB: 10/25/1955, 60 y.o.   MRN: [id number]  Chief Complaint  Patient presents with  . Back Pain    right sided back pain radiates to right leg, leg gives out    HPI Dawn Kirk is a 60 y.o. female.  Presents for pain in her right hip status post lumbar fusion by [facility].  HPI   She's had pain in the right hip for years. Denies injury. Current symptoms include pain catching giving way tingling  Burning and aching is noted as well. Symptoms are constant pain which 9 out of 10 symptoms she's had an injection. She has increased pain with standing and walking and no relief of any kind to this point.  She does have review of systems findings fatigue vision problems depression seasonal allergy back pain gait disturbance joint pain burning pain in the right leg with tingling all other systems were negative   Review of Systems Review of Systems  All other systems reviewed and are negative.  As above in hpi   Past Medical History  Diagnosis Date  . Anxiety   . Depression   . Hypertension   . GERD (gastroesophageal reflux disease)   . Arthritis   . Asthma     related to bronchitis-no recent issues  . PONV (postoperative nausea and vomiting)   . Diabetes mellitus   . Peripheral neuropathy (HCC)   . Psoriasis   . Hypercholesterolemia   . [id number](478.2)     Past Surgical History  Procedure Laterality Date  . Cesarean section  1984  . Tubal ligation  1989  . Neck surgery  x2-2004  . Cholecystectomy  2003  . Replacement total knee bilateral  2007    [facility], Dr. Romeo Apple  . Colonoscopy  2009  . Foot surgery  2006  . Endometrial ablation  2006  . Wrist arthroscopy  07/08/2011    Procedure: ARTHROSCOPY WRIST;  Surgeon: Marlowe Shores, MD;  Location: [facility];  Service: Orthopedics;  Laterality: Right;  right wrist with ganglionectomy,   . Steriod injection  07/08/2011    Procedure: STEROID INJECTION;  Surgeon:  Marlowe Shores, MD;  Location: [facility];  Service: Orthopedics;  Laterality: Left;  injection 1ml celestone left index finger  . Cardiac catheterization  2011-[place]  . Hammer toe surgery  12/01/2011    Procedure: HAMMER TOE CORRECTION;  Surgeon: Dallas Schimke, DPM;  Location: [facility];  Service: Orthopedics;  Laterality: Right;  Arthroplasty 5th Digit Right Foot  . Posterior lumbar fusion N/A 02/01/2014    Procedure: POSTERIOR LUMBAR FUSION LUMBAR FIVE-SACRAL ONE INTERBODY FUSION WITH INTERBODY FUSION WITH INTERBODY PROSTHESIS POSTERIOR LATERAL ARTHRODESIS POSTERIOR NONSEGMENTAL INSTRUMENTATION;  Surgeon: Carmela Hurt, MD;  Location: [facility];  Service: Neurosurgery;  Laterality: N/A;  Lumbar 5 GILL procedure, POSTERIOR LATERAL ARTHRODESIS POSTERIOR NONSEGMENTAL INSTRUMENTATION    Family History  Problem Relation Age of Onset  . Asthma Other   . COPD Other   . Hypertension Other   . Stroke Other   . Heart disease Other     Social History Social History  Substance Use Topics  . Smoking status: Current Every Day Smoker -- 0.50 packs/day for 40 years    Types: Cigarettes  . Smokeless tobacco: Never Used  . Alcohol Use: No    Allergies  Allergen Reactions  . Codeine Other (See Comments)    REACTION: hallucinations  . Levofloxacin Nausea And Vomiting  .  Adhesive [Tape] Itching and Rash    Current Outpatient Prescriptions  Medication Sig Dispense Refill  . albuterol (PROVENTIL HFA;VENTOLIN HFA) 108 (90 BASE) MCG/ACT inhaler Inhale 1-2 puffs into the lungs every 6 (six) hours as needed for wheezing or shortness of breath. 1 Inhaler 0  . aspirin EC 81 MG tablet Take 81 mg by mouth every morning.    . Clobetasol Propionate (TEMOVATE) 0.05 % external spray Apply 1 spray topically 2 (two) times daily as needed (FOR PSORIASIS FLARE).    Marland Kitchen esomeprazole (NEXIUM) 20 MG capsule Take 20 mg by mouth daily at 12 noon.    . rosuvastatin (CRESTOR) 5 MG tablet Take 5 mg  by mouth at bedtime.     . Sennosides (SENOKOT PO) Take by mouth.    . SitaGLIPtin-MetFORMIN HCl [phone] MG TB24 Take 1 tablet by mouth daily.     . traZODone (DESYREL) 50 MG tablet Take 50 mg by mouth at bedtime.    . triamterene-hydrochlorothiazide (DYAZIDE) 37.5-25 MG per capsule Take 1 capsule by mouth every morning.      . Vortioxetine HBr (BRINTELLIX) 20 MG TABS Take 20 mg by mouth daily.      No current facility-administered medications for this visit.       Physical Exam Blood pressure 120/71, height  (1.626 m), weight 236 lb (107.049 kg). Physical Exam The patient is well developed well nourished and well groomed.  Orientation to person place and time is normal  Mood is pleasant.   Upper extremities:   Ambulatory status no assistive devices lumbar spine exam is as follows tenderness lumbar spine decreased flexion extension  Examination of the hip reveals no tenderness in the groin and no tenderness over the greater trochanter  Hip flexion is  110 Internal rotation is  15 External rotation is  20 Hip stability is normal  Log roll maneuver normal no tenderness or pain Push pull test is normal  Hip flexion strength 5/5 Leg lengths are equal Overall alignment is normal Neurovascular examination normal sensation in the 2 lower extremities with normal pulses and perfusion, no peripheral edema, normal color   The groin area has normal lymph nodes   The opposite hip has normal alignment no tenderness normal range of motion stability and strength skin normal again pulses normal groin lymph nodes are negative sensation is normal to soft touch  The upper extremities on inspection there is no malalignment asymmetry or crepitation or defects. Range of motion assessment no crepitation or contracture all joints are reduced without luxation subluxation or laxity and muscle strength and tone normal without atrophy no tremors noted. Data Reviewed IMAGING: L-spine x-rays  show fusion spondylolisthesis increased lordosis  I also looked at a pelvis that shows a normal right and left hip  Also looked at her pre-fusion MRI which showed L2 foraminal stenosis though mild which could give her right hip pain  Most of her pain however is in her back and buttocks and right leg  This seems to suggest continued spinal stenosis    Plan    Intra-articular injection with Depo-Medrol and lidocaine to differentiate hip versus spine pathology  If we don't get a response from this then we should do MRI or CT myelogram with contrast in the MRI.     Lumbar spine x-ray  Multi screw hardware fixation for spondylolisthesis I presume multiple level joint arthritis above the level of the fusion. Increased lumbar lordosis is noted as well.

## 2015-09-09 NOTE — Patient Instructions (Addendum)
Hip injection at [facility] radiology 09/16/15 10:15am, stop Aspirin 4 days before

## 2015-09-16 ENCOUNTER — Ambulatory Visit (HOSPITAL_COMMUNITY)
Admission: RE | Admit: 2015-09-16 | Discharge: 2015-09-16 | Disposition: A | Discharge status: Home / Self Care | Payer: 59 | Source: Ambulatory Visit | Attending: Orthopedic Surgery | Admitting: Orthopedic Surgery

## 2015-09-16 ENCOUNTER — Encounter (HOSPITAL_COMMUNITY): Payer: Self-pay

## 2015-09-16 DIAGNOSIS — G8929 Other chronic pain: Secondary | ICD-10-CM | POA: Insufficient documentation

## 2015-09-16 DIAGNOSIS — M25551 Pain in right hip: Secondary | ICD-10-CM | POA: Diagnosis not present

## 2015-09-16 MED ORDER — LIDOCAINE HCL (PF) 1 % IJ SOLN
INTRAMUSCULAR | Status: AC
  Filled 2015-09-16: qty 5

## 2015-09-16 MED ORDER — LIDOCAINE HCL (PF) 2 % IJ SOLN
INTRAMUSCULAR | Status: AC
  Filled 2015-09-16: qty 10

## 2015-09-16 MED ORDER — POVIDONE-IODINE 10 % EX SOLN
CUTANEOUS | Status: AC
  Filled 2015-09-16: qty 15

## 2015-09-16 MED ORDER — METHYLPREDNISOLONE ACETATE 40 MG/ML IJ SUSP
INTRAMUSCULAR | Status: AC
  Filled 2015-09-16: qty 2

## 2015-09-16 MED ORDER — IOHEXOL 350 MG/ML SOLN
50.0000 mL | Freq: Once | INTRAVENOUS | Status: AC | PRN
  Administered 2015-09-16: 20 mL via INTRAVENOUS

## 2015-09-16 MED ORDER — LIDOCAINE-EPINEPHRINE (PF) 1 %-1:200000 IJ SOLN
INTRAMUSCULAR | Status: AC
  Filled 2015-09-16: qty 10

## 2015-09-16 NOTE — Procedures (Signed)
Preprocedure Dx: Chronic RT hip pain Postprocedure Dx: Chronic RT hip pain Procedure  Fluoroscopically guided therapeutic RT joint injection Radiologist:  Tyron Russell Anesthesia:  3 ml of 2% lidocaine Injectate:  4 ml of 1% lidocaine, 40 mg Depo-Medrol )1 ml) Fluoro time:  1 minutes 42 seconds EBL:   None Complications: None

## 2015-09-28 ENCOUNTER — Encounter: Payer: Self-pay | Admitting: Orthopedic Surgery

## 2015-09-28 ENCOUNTER — Ambulatory Visit (INDEPENDENT_AMBULATORY_CARE_PROVIDER_SITE_OTHER): Payer: 59

## 2015-09-28 ENCOUNTER — Ambulatory Visit (INDEPENDENT_AMBULATORY_CARE_PROVIDER_SITE_OTHER): Payer: 59 | Admitting: Orthopedic Surgery

## 2015-09-28 VITALS — BP 106/69 | Ht 64.0 in | Wt 234.0 lb

## 2015-09-28 DIAGNOSIS — M25561 Pain in right knee: Secondary | ICD-10-CM

## 2015-09-28 DIAGNOSIS — M5441 Lumbago with sciatica, right side: Secondary | ICD-10-CM | POA: Diagnosis not present

## 2015-09-28 DIAGNOSIS — M4806 Spinal stenosis, lumbar region: Secondary | ICD-10-CM | POA: Diagnosis not present

## 2015-09-28 DIAGNOSIS — M961 Postlaminectomy syndrome, not elsewhere classified: Secondary | ICD-10-CM | POA: Diagnosis not present

## 2015-09-28 DIAGNOSIS — M48062 Spinal stenosis, lumbar region with neurogenic claudication: Secondary | ICD-10-CM

## 2015-09-28 NOTE — Progress Notes (Signed)
Chief Complaint  Patient presents with  . Follow-up    FOLLOW UP RIGHT HIP S/P HIP INJECT    Status post lumbar fusion came in for hip pain we got a intra-articular injection and it didn't help her. So the source is in her back referring back to neurosurgery  She then wanted her right knee checked out she had pain and a mass over the medial aspect of the knee status post knee replacement in 2007  She was rubbing her leg , felt it last night or the day before. Had no other issues with her knee up until this time. Knee function as far as the total knee goes has been good   Review of systems no new findings please see previous note, dated  09/09/2015   Past Medical History  Diagnosis Date  . Anxiety   . Depression   . Hypertension   . GERD (gastroesophageal reflux disease)   . Arthritis   . Asthma     related to bronchitis-no recent issues  . PONV (postoperative nausea and vomiting)   . Diabetes mellitus   . Peripheral neuropathy (HCC)   . Psoriasis   . Hypercholesterolemia   . Headache(784.0)      i want to  take an x-ray of the knee.   Incision looks good the motion is excellent. Stable strength is normal skin is intact incision healed well  gait is notable for a flexed posture but she is oriented 3 Right Knee Exam   Tenderness  The patient is experiencing tenderness in the medial retinaculum (mass in soft tissue).  Range of Motion  Extension: 0  Flexion: 120   Muscle Strength   The patient has normal right knee strength.  Tests  Drawer:       Anterior - negative    Posterior - negative Patellar Apprehension: negative  Other  Erythema: absent Scars: present Sensation: normal Pulse: present Swelling: none Other tests: no effusion present       X-rays show stable fixed well tka with some peripheral bone fragments I judge as insignificnat and not related to the marker    Recommend leave this alone  Follow-up as needed please see neurosurgeon  regarding right hip leg pain and back pain

## 2015-10-07 MED FILL — traZODone HCL 50 MG TABS: 50 | 30 days supply | Qty: 30 | Fill #1

## 2015-10-07 MED FILL — JANUMET XR 100-1,000 MG TAB: 100-1000 | 30 days supply | Qty: 30 | Fill #4

## 2015-10-07 MED FILL — TRINTELLIX 20 MG TABLET: 20 | 30 days supply | Qty: 30 | Fill #5

## 2015-10-08 ENCOUNTER — Ambulatory Visit (HOSPITAL_COMMUNITY)
Admission: RE | Admit: 2015-10-08 | Discharge: 2015-10-08 | Disposition: A | Discharge status: Home / Self Care | Payer: 59 | Source: Ambulatory Visit | Attending: Neurosurgery | Admitting: Neurosurgery

## 2015-10-08 ENCOUNTER — Other Ambulatory Visit (HOSPITAL_COMMUNITY): Payer: Self-pay | Admitting: Neurosurgery

## 2015-10-08 DIAGNOSIS — M4310 Spondylolisthesis, site unspecified: Secondary | ICD-10-CM

## 2015-10-08 DIAGNOSIS — M4327 Fusion of spine, lumbosacral region: Secondary | ICD-10-CM | POA: Diagnosis not present

## 2015-10-08 DIAGNOSIS — M4806 Spinal stenosis, lumbar region: Secondary | ICD-10-CM | POA: Diagnosis not present

## 2015-10-08 DIAGNOSIS — M4316 Spondylolisthesis, lumbar region: Secondary | ICD-10-CM | POA: Diagnosis not present

## 2015-10-08 DIAGNOSIS — M5416 Radiculopathy, lumbar region: Secondary | ICD-10-CM | POA: Diagnosis not present

## 2015-10-08 LAB — POCT I-STAT CREATININE: Creatinine, Ser: 1.3 mg/dL — ABNORMAL HIGH (ref 0.44–1.00)

## 2015-10-08 MED ORDER — GADOBENATE DIMEGLUMINE 529 MG/ML IV SOLN
20.0000 mL | Freq: Once | INTRAVENOUS | Status: AC | PRN
  Administered 2015-10-08: 20 mL via INTRAVENOUS

## 2015-10-12 DIAGNOSIS — Z6841 Body Mass Index (BMI) 40.0 and over, adult: Secondary | ICD-10-CM | POA: Diagnosis not present

## 2015-10-12 DIAGNOSIS — M5417 Radiculopathy, lumbosacral region: Secondary | ICD-10-CM | POA: Diagnosis not present

## 2015-10-13 ENCOUNTER — Ambulatory Visit: Payer: 59 | Admitting: *Deleted

## 2015-10-19 ENCOUNTER — Ambulatory Visit (HOSPITAL_COMMUNITY): Payer: 59

## 2015-10-23 MED FILL — CLOBETASOL PROP 0.05% SPRAY: 0.05 | 14 days supply | Qty: 125 | Fill #1

## 2015-10-26 DIAGNOSIS — Z6841 Body Mass Index (BMI) 40.0 and over, adult: Secondary | ICD-10-CM | POA: Diagnosis not present

## 2015-10-26 DIAGNOSIS — M5417 Radiculopathy, lumbosacral region: Secondary | ICD-10-CM | POA: Diagnosis not present

## 2015-10-27 ENCOUNTER — Other Ambulatory Visit: Payer: Self-pay | Admitting: *Deleted

## 2015-10-27 ENCOUNTER — Encounter: Payer: Self-pay | Admitting: *Deleted

## 2015-10-27 NOTE — Patient Outreach (Addendum)
[facility]) Care Management   10/27/2015  Michaelle CopasJanet B Hunn January 11, 1956 [id number]  Michaelle CopasJanet B Gasser is an 60 y.o. female who [facility] Care Management office for routine Link To Wellness follow up for self management assistance with Type II DM, HTN and hyperlipidemia. Marylu LundJanet has seen Lyna PoserMonica Wilson Pharm D in the past for Link To Wellness follow up and will now see this RNCM for long term follow up.  Subjective:  Marylu LundJanet states she continues to deal with chronic back pain and recently had a nerve block. She was told it may take up to a week before the effectiveness of the nerve block is known. She says standing or walking for longer than 5 minutes exacerbates the pain. She report she saw Dr. Margo AyeHall in February and her Hgb A1C was 6.1%. She reports that she checks her CBG 3 times weekly and her fasting variance is 85-95 and her post meal variance is 115-135. She defines her hypoglycemia threshold at 50-60 and denies recent hypoglycemia.  She attributes her weight loss to a consistent CHO restricted meal plan and the determination to keep her diabetes in good control. Says she cannot exercise due to back pain.  Objective:   Review of Systems  Constitutional: Negative.     Physical Exam  Constitutional: She is oriented to person, place, and time. She appears well-developed and well-nourished.  Respiratory: Effort normal.  Neurological: She is alert and oriented to person, place, and time.  Skin: Skin is warm and dry.  Psychiatric: She has a normal mood and affect. Her behavior is normal. Judgment and thought content normal.   Filed Weights   10/27/15 1530  Weight: 233 lb 6.4 oz (105.87 kg)   Filed Vitals:   10/27/15 1530  BP: 118/80   Encounter Medications:   Outpatient Encounter Prescriptions as of 10/27/2015  Medication Sig  . aspirin EC 81 MG tablet Take 81 mg by mouth every morning.  . Clobetasol Propionate (TEMOVATE) 0.05 %  external spray Apply 1 spray topically 2 (two) times daily as needed (FOR PSORIASIS FLARE).  Marland Kitchen. esomeprazole (NEXIUM) 20 MG capsule Take 20 mg by mouth daily at 12 noon.  . rosuvastatin (CRESTOR) 5 MG tablet Take 5 mg by mouth at bedtime.   . Sennosides (SENOKOT PO) Take by mouth at bedtime.   . SitaGLIPtin-MetFORMIN HCl [phone] MG TB24 Take 1 tablet by mouth daily.   . traZODone (DESYREL) 50 MG tablet Take 50 mg by mouth at bedtime.  . triamterene-hydrochlorothiazide (DYAZIDE) 37.5-25 MG per capsule Take 1 capsule by mouth every morning.    . Vortioxetine HBr (BRINTELLIX) 20 MG TABS Take 20 mg by mouth daily.    No facility-administered encounter medications on file as of 10/27/2015.    Functional Status:   In your present state of health, do you have any difficulty performing the following activities: 03/10/2015  Hearing? N  Vision? N  Difficulty concentrating or making decisions? N  Walking or climbing stairs? Y  Dressing or bathing? N  Doing errands, shopping? Y    Fall/Depression Screening:    PHQ 2/9 Scores 03/10/2015  PHQ - 2 Score 3  PHQ- 9 Score 4    Assessment:   [employer] employee and Link To Wellness member with Type II DM, HTN and hyperlipidemia meeting all treatment targets per patient report. Labs results from 09/04/15 received to this office on on 10/29/15 show Hgb A1C= 6.4%, BP readings normal and lipid profle normal  except HDL slightly low at 32  Plan:  [facility] CM Care Plan Problem One        Most Recent Value   Care Plan Problem One  Member with Type II DM, HTN and Hyperlipidemia meeting all treatment targets per patient report, Hgb A1C= 6.4% on 09/04/15, BP readings <140/<90 per today's assessment and at MD office visits on 09/08/15 and 04/28/15 and 01/28/15, lipid profile of 09/04/15 normal except HDL= 32   Role Documenting the Problem One  Care Management Coordinator   Care Plan for Problem One  Active   [facility] Long Term Goal (31-90 days)  Ongoing good control of chronic  health problems as evidenced by CBGS meeting target >75%  and normal lipid profile and BP readings and Hgb A1C <7.0%    [facility] Long Term Goal Start Date  10/27/15   Interventions for Problem One Long Term Goal  reviewed changes to personal and family health history since last visit, discussed ongoing chronic back pain and provided her with website for  information related  to alternative treatment with spinal cord stimulation, provided time for Jasmain to discuss stress related to care of her elderly parents and provided information on [facility] CM community services and advised Shanesha that her parents eligibility for community services will be investigated, made referral to telephonic [facility] CM for needs assessment as her parents are eligible, reviewed medications and assessed medication adherence,  reviewed CBG readings and targets, reviewed most recent Hgb A1C and reviewed targets, congratulated member on weight loss of 6 lbs in last 6 months and discussed strategies for success and ongoing weight loss, will plan to meet with member every 6 months since glycemic control is consistently meeting Hgb A1C target       RNCM to fax today's office visit note to Dr. Margo Aye and request most recent lab results. RNCM will meet quarterly and as needed with patient per Link To Wellness program guidelines to assist with Type II DM, HTN and hyperlipidemia self-management and assess patient's progress toward mutually set goals. [name] RN,CCM,CDE [facility] Care Management Coordinator Link To Wellness Office Phone [phone] Office Fax [phone]

## 2015-11-09 DIAGNOSIS — M5417 Radiculopathy, lumbosacral region: Secondary | ICD-10-CM | POA: Diagnosis not present

## 2015-11-09 DIAGNOSIS — M4310 Spondylolisthesis, site unspecified: Secondary | ICD-10-CM | POA: Diagnosis not present

## 2015-11-09 DIAGNOSIS — Z6839 Body mass index (BMI) 39.0-39.9, adult: Secondary | ICD-10-CM | POA: Diagnosis not present

## 2015-11-09 MED FILL — traZODone HCL 50 MG TABS: 50 | 30 days supply | Qty: 30 | Fill #2

## 2015-11-09 MED FILL — JANUMET XR 100-1,000 MG TAB: 100-1000 | 30 days supply | Qty: 30 | Fill #5

## 2015-11-12 DIAGNOSIS — H52221 Regular astigmatism, right eye: Secondary | ICD-10-CM | POA: Diagnosis not present

## 2015-11-12 DIAGNOSIS — H524 Presbyopia: Secondary | ICD-10-CM | POA: Diagnosis not present

## 2015-11-12 DIAGNOSIS — H5203 Hypermetropia, bilateral: Secondary | ICD-10-CM | POA: Diagnosis not present

## 2015-11-12 MED FILL — TRINTELLIX 20 MG TABLET: 20 | 30 days supply | Qty: 30 | Fill #0

## 2015-11-16 DIAGNOSIS — N390 Urinary tract infection, site not specified: Secondary | ICD-10-CM | POA: Diagnosis not present

## 2015-12-07 MED FILL — TRIAMTERENE/HCTZ 37.5/25 CP: 37.5-25 | 30 days supply | Qty: 30 | Fill #1

## 2015-12-07 MED FILL — JANUMET XR 100-1,000 MG TAB: 100-1000 | 30 days supply | Qty: 30 | Fill #0

## 2015-12-07 MED FILL — ROSUVASTATIN CALCIUM 5 MG T: 5 | 90 days supply | Qty: 90 | Fill #1

## 2015-12-07 MED FILL — traZODone HCL 50 MG TABS: 50 | 30 days supply | Qty: 30 | Fill #0

## 2015-12-07 MED FILL — TRINTELLIX 20 MG TABLET: 20 | 30 days supply | Qty: 30 | Fill #1

## 2015-12-14 DIAGNOSIS — J060 Acute laryngopharyngitis: Secondary | ICD-10-CM | POA: Diagnosis not present

## 2015-12-14 DIAGNOSIS — R05 Cough: Secondary | ICD-10-CM | POA: Diagnosis not present

## 2016-01-04 MED FILL — JANUMET XR 100-1,000 MG TAB: 100-1000 | 30 days supply | Qty: 30 | Fill #1

## 2016-01-04 MED FILL — traZODone HCL 50 MG TABS: 50 | 30 days supply | Qty: 30 | Fill #1

## 2016-01-04 MED FILL — TRINTELLIX 20 MG TABLET: 20 | 30 days supply | Qty: 30 | Fill #2

## 2016-01-06 DIAGNOSIS — E1141 Type 2 diabetes mellitus with diabetic mononeuropathy: Secondary | ICD-10-CM | POA: Diagnosis not present

## 2016-01-06 DIAGNOSIS — E782 Mixed hyperlipidemia: Secondary | ICD-10-CM | POA: Diagnosis not present

## 2016-01-06 MED FILL — TRIAMTERENE/HCTZ 37.5/25 CP: 37.5-25 | 90 days supply | Qty: 90 | Fill #0

## 2016-01-11 DIAGNOSIS — E1141 Type 2 diabetes mellitus with diabetic mononeuropathy: Secondary | ICD-10-CM | POA: Diagnosis not present

## 2016-01-11 DIAGNOSIS — E782 Mixed hyperlipidemia: Secondary | ICD-10-CM | POA: Diagnosis not present

## 2016-01-11 DIAGNOSIS — Z720 Tobacco use: Secondary | ICD-10-CM | POA: Diagnosis not present

## 2016-01-11 DIAGNOSIS — K5900 Constipation, unspecified: Secondary | ICD-10-CM | POA: Diagnosis not present

## 2016-01-11 DIAGNOSIS — F411 Generalized anxiety disorder: Secondary | ICD-10-CM | POA: Diagnosis not present

## 2016-01-11 DIAGNOSIS — Z634 Disappearance and death of family member: Secondary | ICD-10-CM | POA: Diagnosis not present

## 2016-01-11 DIAGNOSIS — F339 Major depressive disorder, recurrent, unspecified: Secondary | ICD-10-CM | POA: Diagnosis not present

## 2016-01-11 DIAGNOSIS — R944 Abnormal results of kidney function studies: Secondary | ICD-10-CM | POA: Diagnosis not present

## 2016-02-03 MED FILL — traZODone HCL 50 MG TABS: 50 | 30 days supply | Qty: 30 | Fill #2

## 2016-02-03 MED FILL — TRINTELLIX 20 MG TABLET: 20 | 30 days supply | Qty: 30 | Fill #3

## 2016-02-03 MED FILL — JANUMET XR 100-1,000 MG TAB: 100-1000 | 30 days supply | Qty: 30 | Fill #2

## 2016-02-29 DIAGNOSIS — M4310 Spondylolisthesis, site unspecified: Secondary | ICD-10-CM | POA: Diagnosis not present

## 2016-02-29 DIAGNOSIS — M5417 Radiculopathy, lumbosacral region: Secondary | ICD-10-CM | POA: Diagnosis not present

## 2016-03-03 MED FILL — ROSUVASTATIN CALCIUM 5 MG T: 5 | 90 days supply | Qty: 90 | Fill #2

## 2016-03-03 MED FILL — JANUMET XR 100-1,000 MG TAB: 100-1000 | 30 days supply | Qty: 30 | Fill #3

## 2016-03-03 MED FILL — TRINTELLIX 20 MG TABLET: 20 | 30 days supply | Qty: 30 | Fill #4

## 2016-03-03 MED FILL — traZODone HCL 50 MG TABS: 50 | 30 days supply | Qty: 30 | Fill #0

## 2016-04-07 MED FILL — TRINTELLIX 20 MG TABLET: 20 | 30 days supply | Qty: 30 | Fill #5

## 2016-04-07 MED FILL — TRIAMTERENE/HCTZ 37.5/25 CP: 37.5-25 | 30 days supply | Qty: 30 | Fill #1

## 2016-04-07 MED FILL — traZODone HCL 50 MG TABS: 50 | 30 days supply | Qty: 30 | Fill #1

## 2016-04-07 MED FILL — JANUMET XR 100-1,000 MG TAB: 100-1000 | 30 days supply | Qty: 30 | Fill #4

## 2016-04-14 ENCOUNTER — Other Ambulatory Visit: Payer: Self-pay | Admitting: Internal Medicine

## 2016-04-14 DIAGNOSIS — Z1231 Encounter for screening mammogram for malignant neoplasm of breast: Secondary | ICD-10-CM

## 2016-04-18 ENCOUNTER — Ambulatory Visit
Admission: RE | Admit: 2016-04-18 | Discharge: 2016-04-18 | Disposition: A | Discharge status: Home / Self Care | Payer: 59 | Source: Ambulatory Visit | Attending: Internal Medicine | Admitting: Internal Medicine

## 2016-04-18 DIAGNOSIS — Z1231 Encounter for screening mammogram for malignant neoplasm of breast: Secondary | ICD-10-CM | POA: Diagnosis not present

## 2016-05-05 MED FILL — traZODone HCL 50 MG TABS: 50 | 30 days supply | Qty: 30 | Fill #2

## 2016-05-05 MED FILL — JANUMET XR 100-1,000 MG TAB: 100-1000 | 30 days supply | Qty: 30 | Fill #5

## 2016-05-06 MED FILL — TRIAMTERENE/HCTZ 37.5/25 CP: 37.5-25 | 90 days supply | Qty: 90 | Fill #0

## 2016-05-06 MED FILL — TRINTELLIX 20 MG TABLET: 20 | 30 days supply | Qty: 30 | Fill #0

## 2016-05-17 ENCOUNTER — Other Ambulatory Visit: Payer: Self-pay | Admitting: *Deleted

## 2016-05-17 NOTE — Patient Outreach (Signed)
Secure e-mail [facility] [place] [facility]mail address requesting she schedule a Link To Wellness office visit before 2018. Await return response. Bary RichardJanet S. Hauser RN,CCM,CDE [facility] Care Management Coordinator Link To Wellness Office Phone [phone] Office Fax [phone]

## 2016-05-31 ENCOUNTER — Other Ambulatory Visit: Payer: Self-pay | Admitting: *Deleted

## 2016-05-31 NOTE — Patient Outreach (Signed)
[facility]) Care Management   05/31/2016  Michaelle CopasJanet B Boling 1956/06/06 [id number]  Michaelle CopasJanet B Kirchner is an 60 y.o. female who presents [facility] the [facility] Care Management office for routine Link To Wellness follow up for self management assistance with Type II DM, HTN and hyperlipidemia.   Subjective:  Dawn Kirk says her Dad died on May 24th and her Mom is now living alone, on continuous home oxygen due to COPD. Dawn Kirk has 3 other sisters and they all contribute to her Mom's care even though 2 of her sisters live in [place]. Dawn Kirk says her Mom is showing some signs of mild memory loss.  Dawn Kirk states she continues to deal with chronic back pain, has had 2 nerve blocks done by Dr. Ollen BowlHarkins. She said they help but only last about a week. She says standing or walking for longer than 5 minutes exacerbates her back pain. She report she saw Dr. Margo AyeHall in June and her Hgb A1C was 6.4%. She reports she has not been checking her CBG as often as she was before her Dad died. She defines her hypoglycemia threshold at 50-60 and denies recent hypoglycemia.  She attributes her ongoing weight loss to a consistent CHO restricted meal plan and the determination to keep her diabetes in good control. Says she cannot exercise due to back pain.  Objective:   Review of Systems  Constitutional: Negative.     Physical Exam  Constitutional: She is oriented to person, place, and time. She appears well-developed and well-nourished.  Respiratory: Effort normal.  Neurological: She is alert and oriented to person, place, and time.  Skin: Skin is warm and dry.  Psychiatric: She has a normal mood and affect. Her behavior is normal. Judgment and thought content normal.   Filed Weights   05/31/16 1634  Weight: 227 lb 12.8 oz (103.3 kg)   Vitals:   05/31/16 1634  BP: 100/70   Encounter Medications:   Outpatient Encounter Prescriptions as of 05/31/2016  Medication Sig Note  .  aspirin EC 81 MG tablet Take 81 mg by mouth every morning.   . Clobetasol Propionate (TEMOVATE) 0.05 % external spray Apply 1 spray topically 2 (two) times daily as needed (FOR PSORIASIS FLARE).   Marland Kitchen. esomeprazole (NEXIUM) 20 MG capsule Take 20 mg by mouth daily at 12 noon.   . rosuvastatin (CRESTOR) 5 MG tablet Take 5 mg by mouth at bedtime.    . Sennosides (SENOKOT PO) Take by mouth at bedtime.    . SitaGLIPtin-MetFORMIN HCl [phone] MG TB24 Take 1 tablet by mouth daily.    . traZODone (DESYREL) 50 MG tablet Take 50 mg by mouth at bedtime.   . triamterene-hydrochlorothiazide (DYAZIDE) 37.5-25 MG per capsule Take 1 capsule by mouth every morning.     . Vortioxetine HBr (BRINTELLIX) 20 MG TABS Take 20 mg by mouth daily.  05/31/2016: Trintellix- renamed due to sounds similar to Brilenta   No facility-administered encounter medications on file as of 05/31/2016.     Functional Status:   In your present state of health, do you have any difficulty performing the following activities: 10/27/2015  Hearing? N  Vision? N  Difficulty concentrating or making decisions? N  Walking or climbing stairs? N  Doing errands, shopping? N  Some recent data might be hidden    Fall/Depression Screening:    PHQ 2/9 Scores 10/27/2015 03/10/2015  PHQ - 2 Score 1 3  PHQ- 9 Score - 4    Assessment:  [employer] employee and Link To Wellness member with Type II DM, HTN and hyperlipidemia meeting all treatment targets per patient report. Weight loss of 6 lbs since last visit 6 months ago. Labs results from 09/04/15 received to this office on 10/29/15 show Hgb A1C= 6.4%, BP readings normal and lipid profile normal except HDL slightly low at 32  Plan:  [facility] Problem [facility]        Most Recent Value   Care Plan Problem One  Member with Type II DM, HTN and Hyperlipidemia meeting all treatment targets per patient report, Hgb A1C= 6.4% on 09/04/15, BP readings <140/<90 per today's assessment and at MD office visits on  09/08/15 and 04/28/15 and 01/28/15, lipid profile of 09/04/15 normal except HDL= 32   Role Documenting the Problem One  Care Management Coordinator   Care Plan for Problem One  Active   [facility] Long Term Goal (31-90 days)  Ongoing good control of chronic health problems as evidenced by CBGS meeting target >75%  and normal lipid profile and BP readings and Hgb A1C <7.0% , no weight gain or evidence of ongoing weight loss   [facility] Long Term Goal Start Date  05/31/16   Interventions for Problem One Long Term Goal  reviewed changes to personal and family health history since last visit,  reviewed medications and assessed medication adherence,  reviewed CBG readings and targets, reviewed most recent Hgb A1C and reviewed targets, congratulated member on weight loss of 6 lbs in last 6 months and discussed strategies for ongoing weight loss, ensured that Dawn Kirk has enrolled in the [employer] Program as this will replace the [employer] To [employer] as her disease self management assistance     RNCM to fax today's office visit note to Dr. Oneal DeputyHall   [employer] S. Yoshika Vensel RN,CCM,CDE [facility] Care Management Coordinator Link To Wellness Office Phone [phone] Office Fax [phone]

## 2016-06-01 ENCOUNTER — Encounter: Payer: Self-pay | Admitting: *Deleted

## 2016-06-02 MED FILL — TRINTELLIX 20 MG TABLET: 20 | 30 days supply | Qty: 30 | Fill #1

## 2016-06-03 MED FILL — traZODone HCL 50 MG TABS: 50 | 30 days supply | Qty: 30 | Fill #0

## 2016-06-03 MED FILL — ROSUVASTATIN CALCIUM 5 MG T: 5 | 90 days supply | Qty: 90 | Fill #0

## 2016-06-03 MED FILL — JANUMET XR 100-1,000 MG TAB: 100-1000 | 90 days supply | Qty: 90 | Fill #0

## 2016-07-04 MED FILL — TRINTELLIX 20 MG TABLET: 20 | 30 days supply | Qty: 30 | Fill #2

## 2016-07-04 MED FILL — traZODone HCL 50 MG TABS: 50 | 30 days supply | Qty: 30 | Fill #1

## 2016-07-11 DIAGNOSIS — E782 Mixed hyperlipidemia: Secondary | ICD-10-CM | POA: Diagnosis not present

## 2016-07-11 DIAGNOSIS — I10 Essential (primary) hypertension: Secondary | ICD-10-CM | POA: Diagnosis not present

## 2016-07-11 DIAGNOSIS — E1141 Type 2 diabetes mellitus with diabetic mononeuropathy: Secondary | ICD-10-CM | POA: Diagnosis not present

## 2016-07-15 DIAGNOSIS — F411 Generalized anxiety disorder: Secondary | ICD-10-CM | POA: Diagnosis not present

## 2016-07-15 DIAGNOSIS — E782 Mixed hyperlipidemia: Secondary | ICD-10-CM | POA: Diagnosis not present

## 2016-07-15 DIAGNOSIS — N182 Chronic kidney disease, stage 2 (mild): Secondary | ICD-10-CM | POA: Diagnosis not present

## 2016-07-15 DIAGNOSIS — M545 Low back pain: Secondary | ICD-10-CM | POA: Diagnosis not present

## 2016-07-15 DIAGNOSIS — E1122 Type 2 diabetes mellitus with diabetic chronic kidney disease: Secondary | ICD-10-CM | POA: Diagnosis not present

## 2016-07-15 DIAGNOSIS — I10 Essential (primary) hypertension: Secondary | ICD-10-CM | POA: Diagnosis not present

## 2016-07-15 DIAGNOSIS — Z0000 Encounter for general adult medical examination without abnormal findings: Secondary | ICD-10-CM | POA: Diagnosis not present

## 2016-07-19 ENCOUNTER — Other Ambulatory Visit: Payer: Self-pay | Admitting: *Deleted

## 2016-07-19 NOTE — Patient Outreach (Addendum)
Received return e-mail from  KaufmanJanet after an e-mail  was sent to her requesting her Hgb A1C results from 07/15/16. She replied with e-mail stating her Hgb A1C  was 5.6% and therefore her Janumet was reduced to 1/2 pill daily. Her [employer] and Epic medication list was updated to reflect this change.  Bary RichardJanet S. Hauser RN,CCM,CDE [facility] Care Management Coordinator Link To Wellness Office Phone [phone] Office Fax [phone]

## 2016-08-01 MED FILL — traZODone HCL 50 MG TABS: 50 | 90 days supply | Qty: 90 | Fill #0

## 2016-08-01 MED FILL — TRINTELLIX 20 MG TABLET: 20 | 90 days supply | Qty: 90 | Fill #0

## 2016-08-02 MED FILL — TRIAMTERENE/HCTZ 37.5/25 CP: 37.5-25 | 90 days supply | Qty: 90 | Fill #1

## 2016-09-14 ENCOUNTER — Ambulatory Visit (INDEPENDENT_AMBULATORY_CARE_PROVIDER_SITE_OTHER): Payer: 59 | Admitting: Adult Health

## 2016-09-14 ENCOUNTER — Encounter: Payer: Self-pay | Admitting: Adult Health

## 2016-09-14 ENCOUNTER — Other Ambulatory Visit (HOSPITAL_COMMUNITY)
Admission: RE | Admit: 2016-09-14 | Discharge: 2016-09-14 | Disposition: A | Discharge status: Home / Self Care | Payer: 59 | Source: Ambulatory Visit | Attending: Adult Health | Admitting: Adult Health

## 2016-09-14 VITALS — BP 112/64 | HR 74 | Ht 64.25 in | Wt 221.0 lb

## 2016-09-14 DIAGNOSIS — Z1151 Encounter for screening for human papillomavirus (HPV): Secondary | ICD-10-CM | POA: Insufficient documentation

## 2016-09-14 DIAGNOSIS — Z01411 Encounter for gynecological examination (general) (routine) with abnormal findings: Secondary | ICD-10-CM

## 2016-09-14 DIAGNOSIS — Z1212 Encounter for screening for malignant neoplasm of rectum: Secondary | ICD-10-CM

## 2016-09-14 DIAGNOSIS — Z1211 Encounter for screening for malignant neoplasm of colon: Secondary | ICD-10-CM

## 2016-09-14 DIAGNOSIS — L900 Lichen sclerosus et atrophicus: Secondary | ICD-10-CM | POA: Diagnosis not present

## 2016-09-14 DIAGNOSIS — Z01419 Encounter for gynecological examination (general) (routine) without abnormal findings: Secondary | ICD-10-CM | POA: Insufficient documentation

## 2016-09-14 LAB — HEMOCCULT GUIAC POC 1CARD (OFFICE): FECAL OCCULT BLD: NEGATIVE

## 2016-09-14 MED ORDER — CLOBETASOL PROPIONATE 0.05 % EX CREA
1.0000 | TOPICAL_CREAM | Freq: Two times a day (BID) | CUTANEOUS | 3 refills | Status: DC
Start: 2016-09-14 — End: 2017-01-20

## 2016-09-14 MED ORDER — CLOBETASOL PROPIONATE 0.05 % EX CREA: 1.0000 "application " | TOPICAL_CREAM | Freq: Two times a day (BID) | CUTANEOUS | 0 refills | Status: DC

## 2016-09-14 NOTE — Progress Notes (Signed)
Patient ID: Dawn CopasJanet B Shifrin, female   DOB: Oct 22, 1955, 61 y.o.   MRN: [id number] History of Present Illness: Marylu LundJanet is a 61 year old white female, married in for a well woman gyn exam and pap, last one 03/12/12. PCP is Dr Margo AyeHall.    Current Medications, Allergies, Past Medical History, Past Surgical History, Family History and Social History were reviewed in [facility] electronic medical record.     Review of Systems: Patient denies any headaches, hearing loss, fatigue, blurred vision, shortness of breath, chest pain, abdominal pain, problems with bowel movements(constipated at times,it is chronic), urination, or intercourse. No joint pain or mood swings.    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  The vagina is normal in appearance. Urethra has no lesions or masses. The cervix is smooth,pap with HPV performed.  Uterus is felt to be normal size, shape, and contour.  No adnexal masses or tenderness noted.Bladder is non tender, no masses felt. Rectal: Good sphincter tone, no polyps, or hemorrhoids felt.  Hemoccult negative. Extremities/musculoskeletal:  No swelling or varicosities noted, no clubbing or cyanosis Psych:  No mood changes, alert and cooperative,seems happy PHQ 2 score 0 Explained LSA to her with pictures, she is aware it is  chronic.will try temovate,if not better may biopsy, due to 3 % chance of precancerous lesion.  Impression:  1. Encounter for gynecological examination with Papanicolaou smear of cervix   2. Lichen sclerosus et atrophicus   3. Screening for colorectal cancer      Plan: Rx temovate 0.05% cream use bid with 3 refills Physical in 1 year, pap in 3 if normal Mammogram yearly Labs with PCP  She had colonoscopy at 51 and says no more Follow up in 4 weeks to recheck LSA,may need biopsy

## 2016-09-16 LAB — CYTOLOGY - PAP
ADEQUACY: ABSENT
Diagnosis: NEGATIVE
HPV: NOT DETECTED

## 2016-09-19 MED FILL — ROSUVASTATIN CALCIUM 5 MG T: 5 | 90 days supply | Qty: 90 | Fill #1

## 2016-09-19 MED FILL — JANUMET XR 100-1,000 MG TAB: 100-1000 | 90 days supply | Qty: 90 | Fill #1

## 2016-10-14 ENCOUNTER — Ambulatory Visit (INDEPENDENT_AMBULATORY_CARE_PROVIDER_SITE_OTHER): Payer: 59 | Admitting: Adult Health

## 2016-10-14 ENCOUNTER — Encounter: Payer: Self-pay | Admitting: Adult Health

## 2016-10-14 VITALS — BP 102/70 | HR 72 | Ht 64.25 in | Wt 221.0 lb

## 2016-10-14 DIAGNOSIS — L900 Lichen sclerosus et atrophicus: Secondary | ICD-10-CM

## 2016-10-14 NOTE — Progress Notes (Signed)
Subjective:     Patient ID: Michaelle CopasJanet B Sirico, female   DOB: 08/27/1955, 61 y.o.   MRN: [id number]  HPI Marylu LundJanet is a 61 year old white female in for follow up of starting temovate for LSA 09/14/16.She thinks it looks better.  Review of Systems  No vaginal complaints to day, she thinks it is better Reviewed past medical,surgical, social and family history. Reviewed medications and allergies.     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 Will continue temovate and recheck in 3 months.  Assessment:     1. Lichen sclerosus et atrophicus       Plan:      Continue temovate 2-3 x weekly Follow up in 3 months

## 2016-10-14 NOTE — Patient Instructions (Addendum)
Continue temovate 2-3 x weekly Follow up in 3 months

## 2016-11-01 MED FILL — traZODone HCL 50 MG TABS: 50 | 90 days supply | Qty: 90 | Fill #1

## 2016-11-01 MED FILL — TRINTELLIX 20 MG TABLET: 20 | 90 days supply | Qty: 90 | Fill #1

## 2016-11-01 MED FILL — TRIAMTERENE/HCTZ 37.5/25 CP: 37.5-25 | 90 days supply | Qty: 90 | Fill #0

## 2016-12-20 DIAGNOSIS — R05 Cough: Secondary | ICD-10-CM | POA: Diagnosis not present

## 2016-12-20 DIAGNOSIS — J441 Chronic obstructive pulmonary disease with (acute) exacerbation: Secondary | ICD-10-CM | POA: Diagnosis not present

## 2016-12-21 MED FILL — ROSUVASTATIN CALCIUM 5 MG T: 5 | 90 days supply | Qty: 90 | Fill #2

## 2016-12-21 MED FILL — JANUMET XR 100-1,000 MG TAB: 100-1000 | 90 days supply | Qty: 90 | Fill #0

## 2017-01-13 ENCOUNTER — Ambulatory Visit: Payer: 59 | Admitting: Adult Health

## 2017-01-13 DIAGNOSIS — H43812 Vitreous degeneration, left eye: Secondary | ICD-10-CM | POA: Diagnosis not present

## 2017-01-13 DIAGNOSIS — H5203 Hypermetropia, bilateral: Secondary | ICD-10-CM | POA: Diagnosis not present

## 2017-01-13 DIAGNOSIS — H52223 Regular astigmatism, bilateral: Secondary | ICD-10-CM | POA: Diagnosis not present

## 2017-01-13 DIAGNOSIS — E1141 Type 2 diabetes mellitus with diabetic mononeuropathy: Secondary | ICD-10-CM | POA: Diagnosis not present

## 2017-01-13 DIAGNOSIS — H524 Presbyopia: Secondary | ICD-10-CM | POA: Diagnosis not present

## 2017-01-20 ENCOUNTER — Encounter: Payer: Self-pay | Admitting: Adult Health

## 2017-01-20 ENCOUNTER — Ambulatory Visit: Payer: 59 | Admitting: Adult Health

## 2017-01-20 ENCOUNTER — Ambulatory Visit (INDEPENDENT_AMBULATORY_CARE_PROVIDER_SITE_OTHER): Payer: 59 | Admitting: Adult Health

## 2017-01-20 VITALS — BP 120/60 | HR 60 | Ht 64.0 in | Wt 219.0 lb

## 2017-01-20 DIAGNOSIS — L900 Lichen sclerosus et atrophicus: Secondary | ICD-10-CM

## 2017-01-20 MED ORDER — CLOBETASOL PROPIONATE 0.05 % EX CREA: TOPICAL_CREAM | CUTANEOUS | 3 refills | Status: DC

## 2017-01-20 MED ORDER — CLOBETASOL PROPIONATE 0.05 % EX CREA: 1.0000 "application " | TOPICAL_CREAM | Freq: Two times a day (BID) | CUTANEOUS | 3 refills | Status: DC

## 2017-01-20 NOTE — Progress Notes (Signed)
Subjective:     Patient ID: Michaelle CopasJanet B Kneece, female   DOB: [birth year], 61 y.o.   MRN: [id number]  HPI Marylu LundJanet is a 61 year old white female, back in follow up on using temovate for LSA, and she says it is better.   Review of Systems Peri area is better, no vaginal complaints Reviewed past medical,surgical, social and family history. Reviewed medications and allergies.     Objective:   Physical Exam BP 120/60 (BP Location: Right Arm, Patient Position: Sitting, Cuff Size: Small)   Pulse 60   Ht 5\' 4"  (1.626 m)   Wt 219 lb (99.3 kg)   BMI 37.59 kg/m Skin warm and dry. 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     Assessment:     LSA    Plan:     Use temovate 2-3 x weekly to affected area. Refilled x 3 Follow up in 3 months

## 2017-01-20 NOTE — Patient Instructions (Signed)
Continue temovate 2-3 x weekly, make sure to use in area shown F/U in 3 months

## 2017-01-23 DIAGNOSIS — M545 Low back pain: Secondary | ICD-10-CM | POA: Diagnosis not present

## 2017-01-23 DIAGNOSIS — L409 Psoriasis, unspecified: Secondary | ICD-10-CM | POA: Diagnosis not present

## 2017-01-23 DIAGNOSIS — N182 Chronic kidney disease, stage 2 (mild): Secondary | ICD-10-CM | POA: Diagnosis not present

## 2017-01-23 DIAGNOSIS — E1141 Type 2 diabetes mellitus with diabetic mononeuropathy: Secondary | ICD-10-CM | POA: Diagnosis not present

## 2017-01-23 DIAGNOSIS — J441 Chronic obstructive pulmonary disease with (acute) exacerbation: Secondary | ICD-10-CM | POA: Diagnosis not present

## 2017-01-23 DIAGNOSIS — Z720 Tobacco use: Secondary | ICD-10-CM | POA: Diagnosis not present

## 2017-01-23 DIAGNOSIS — F411 Generalized anxiety disorder: Secondary | ICD-10-CM | POA: Diagnosis not present

## 2017-01-23 DIAGNOSIS — F339 Major depressive disorder, recurrent, unspecified: Secondary | ICD-10-CM | POA: Diagnosis not present

## 2017-01-23 DIAGNOSIS — E782 Mixed hyperlipidemia: Secondary | ICD-10-CM | POA: Diagnosis not present

## 2017-01-30 MED FILL — TRINTELLIX 20 MG TABLET: 20 | 90 days supply | Qty: 90 | Fill #2

## 2017-01-30 MED FILL — traZODone HCL 50 MG TABS: 50 | 90 days supply | Qty: 90 | Fill #2

## 2017-02-23 MED FILL — TRIAMTERENE/HCTZ 37.5/25 CP: 37.5-25 | 90 days supply | Qty: 90 | Fill #1

## 2017-03-28 MED FILL — JANUMET XR 100-1,000 MG TAB: 100-1000 | 90 days supply | Qty: 90 | Fill #1

## 2017-03-28 MED FILL — ROSUVASTATIN CALCIUM 5 MG T: 5 | 90 days supply | Qty: 90 | Fill #0

## 2017-04-21 ENCOUNTER — Ambulatory Visit: Payer: 59 | Admitting: Adult Health

## 2017-04-25 DIAGNOSIS — Z6837 Body mass index (BMI) 37.0-37.9, adult: Secondary | ICD-10-CM | POA: Diagnosis not present

## 2017-04-25 DIAGNOSIS — R1013 Epigastric pain: Secondary | ICD-10-CM | POA: Diagnosis not present

## 2017-04-25 MED FILL — ONDANSETRON HCL 4 MG TABLET: 4 | 7 days supply | Qty: 20 | Fill #0

## 2017-04-25 MED FILL — PANTOPRAZOLE SOD DR 40 MG T: 40 | 30 days supply | Qty: 30 | Fill #0

## 2017-04-25 MED FILL — CARAFATE 1 GM/10 ML SUSP: 1 | 11 days supply | Qty: 420 | Fill #0

## 2017-04-26 MED FILL — traZODone HCL 50 MG TABS: 50 | 90 days supply | Qty: 90 | Fill #0

## 2017-04-26 MED FILL — TRINTELLIX 20 MG TABLET: 20 | 90 days supply | Qty: 90 | Fill #0

## 2017-05-09 DIAGNOSIS — Z23 Encounter for immunization: Secondary | ICD-10-CM | POA: Diagnosis not present

## 2017-05-10 DIAGNOSIS — R1013 Epigastric pain: Secondary | ICD-10-CM | POA: Diagnosis not present

## 2017-05-12 ENCOUNTER — Other Ambulatory Visit (HOSPITAL_COMMUNITY): Payer: Self-pay | Admitting: Internal Medicine

## 2017-05-12 DIAGNOSIS — R1013 Epigastric pain: Secondary | ICD-10-CM

## 2017-05-18 DIAGNOSIS — L400 Psoriasis vulgaris: Secondary | ICD-10-CM | POA: Diagnosis not present

## 2017-05-23 ENCOUNTER — Ambulatory Visit (HOSPITAL_COMMUNITY)
Admission: RE | Admit: 2017-05-23 | Discharge: 2017-05-23 | Disposition: A | Discharge status: Home / Self Care | Payer: 59 | Source: Ambulatory Visit | Attending: Internal Medicine | Admitting: Internal Medicine

## 2017-05-23 DIAGNOSIS — K6389 Other specified diseases of intestine: Secondary | ICD-10-CM | POA: Insufficient documentation

## 2017-05-23 DIAGNOSIS — R1013 Epigastric pain: Secondary | ICD-10-CM

## 2017-05-23 DIAGNOSIS — R1110 Vomiting, unspecified: Secondary | ICD-10-CM | POA: Diagnosis not present

## 2017-05-23 DIAGNOSIS — I700 Atherosclerosis of aorta: Secondary | ICD-10-CM | POA: Diagnosis not present

## 2017-05-23 LAB — POCT I-STAT CREATININE: Creatinine, Ser: 1.3 mg/dL — ABNORMAL HIGH (ref 0.44–1.00)

## 2017-05-23 MED ORDER — IOPAMIDOL (ISOVUE-300) INJECTION 61%
80.0000 mL | Freq: Once | INTRAVENOUS | Status: AC | PRN
  Administered 2017-05-23: 80 mL via INTRAVENOUS

## 2017-05-24 MED FILL — PANTOPRAZOLE SOD DR 40 MG T: 40 | 30 days supply | Qty: 30 | Fill #1

## 2017-05-24 MED FILL — TRIAMTERENE/HCTZ 37.5/25 CP: 37.5-25 | 90 days supply | Qty: 90 | Fill #2

## 2017-05-24 MED FILL — CIPROFLOXACIN HCL 500 MG TA: 500 | 7 days supply | Qty: 14 | Fill #0

## 2017-05-24 MED FILL — metroNIDAZOLE 500 MG TABS: 500 | 7 days supply | Qty: 21 | Fill #0

## 2017-05-31 DIAGNOSIS — K219 Gastro-esophageal reflux disease without esophagitis: Secondary | ICD-10-CM | POA: Diagnosis not present

## 2017-05-31 DIAGNOSIS — R109 Unspecified abdominal pain: Secondary | ICD-10-CM | POA: Diagnosis not present

## 2017-05-31 DIAGNOSIS — Z6837 Body mass index (BMI) 37.0-37.9, adult: Secondary | ICD-10-CM | POA: Diagnosis not present

## 2017-05-31 DIAGNOSIS — R1013 Epigastric pain: Secondary | ICD-10-CM | POA: Diagnosis not present

## 2017-06-01 ENCOUNTER — Encounter: Payer: Self-pay | Admitting: Gastroenterology

## 2017-06-05 MED FILL — DEXILANT DR 60 MG CAPSULE: 60 | 30 days supply | Qty: 30 | Fill #0

## 2017-06-23 ENCOUNTER — Other Ambulatory Visit: Payer: Self-pay | Admitting: *Deleted

## 2017-06-23 NOTE — Patient Outreach (Signed)
Marylu LundJanet transitioned from the [employer] program to the [employer] digital assistant platform on 08/02/16 for Type II diabetes self-management assistance so will close case to the diabetes [facility] program due to delegation of disease management services to [employer] from [employer] for [employer] plan members in 2019. Bary RichardJanet S. Brianna Bennett RN,CCM,CDE [facility] Care Management Coordinator [employer] [facility] and [employer] Office Phone [phone] Office Fax [phone]

## 2017-06-26 MED FILL — ROSUVASTATIN CALCIUM 5 MG T: 5 | 90 days supply | Qty: 90 | Fill #1

## 2017-07-04 MED FILL — DEXILANT DR 60 MG CAPSULE: 60 | 30 days supply | Qty: 30 | Fill #1

## 2017-07-04 MED FILL — JANUMET XR 100-1,000 MG TAB: 100-1000 | 90 days supply | Qty: 90 | Fill #2

## 2017-07-21 DIAGNOSIS — Z6837 Body mass index (BMI) 37.0-37.9, adult: Secondary | ICD-10-CM | POA: Diagnosis not present

## 2017-07-21 DIAGNOSIS — J069 Acute upper respiratory infection, unspecified: Secondary | ICD-10-CM | POA: Diagnosis not present

## 2017-07-31 ENCOUNTER — Ambulatory Visit: Payer: 59 | Admitting: Nurse Practitioner

## 2017-07-31 DIAGNOSIS — E1141 Type 2 diabetes mellitus with diabetic mononeuropathy: Secondary | ICD-10-CM | POA: Diagnosis not present

## 2017-07-31 DIAGNOSIS — E782 Mixed hyperlipidemia: Secondary | ICD-10-CM | POA: Diagnosis not present

## 2017-07-31 DIAGNOSIS — N182 Chronic kidney disease, stage 2 (mild): Secondary | ICD-10-CM | POA: Diagnosis not present

## 2017-07-31 DIAGNOSIS — I10 Essential (primary) hypertension: Secondary | ICD-10-CM | POA: Diagnosis not present

## 2017-08-01 MED FILL — traZODone HCL 50 MG TABS: 50 | 90 days supply | Qty: 90 | Fill #0

## 2017-08-01 MED FILL — TRINTELLIX 20 MG TABLET: 20 | 90 days supply | Qty: 90 | Fill #0

## 2017-08-02 DIAGNOSIS — K219 Gastro-esophageal reflux disease without esophagitis: Secondary | ICD-10-CM | POA: Diagnosis not present

## 2017-08-02 DIAGNOSIS — L409 Psoriasis, unspecified: Secondary | ICD-10-CM | POA: Diagnosis not present

## 2017-08-02 DIAGNOSIS — N182 Chronic kidney disease, stage 2 (mild): Secondary | ICD-10-CM | POA: Diagnosis not present

## 2017-08-02 DIAGNOSIS — F339 Major depressive disorder, recurrent, unspecified: Secondary | ICD-10-CM | POA: Diagnosis not present

## 2017-08-02 DIAGNOSIS — F411 Generalized anxiety disorder: Secondary | ICD-10-CM | POA: Diagnosis not present

## 2017-08-02 DIAGNOSIS — Z720 Tobacco use: Secondary | ICD-10-CM | POA: Diagnosis not present

## 2017-08-02 DIAGNOSIS — J441 Chronic obstructive pulmonary disease with (acute) exacerbation: Secondary | ICD-10-CM | POA: Diagnosis not present

## 2017-08-02 DIAGNOSIS — E1141 Type 2 diabetes mellitus with diabetic mononeuropathy: Secondary | ICD-10-CM | POA: Diagnosis not present

## 2017-08-02 DIAGNOSIS — E782 Mixed hyperlipidemia: Secondary | ICD-10-CM | POA: Diagnosis not present

## 2017-08-02 MED FILL — DEXILANT DR 60 MG CAPSULE: 60 | 90 days supply | Qty: 90 | Fill #0

## 2017-08-23 MED FILL — TRIAMTERENE/HCTZ 37.5/25 CP: 37.5-25 | 90 days supply | Qty: 90 | Fill #0

## 2017-09-26 MED FILL — ROSUVASTATIN CALCIUM 5 MG T: 5 | 90 days supply | Qty: 90 | Fill #0

## 2017-10-03 ENCOUNTER — Ambulatory Visit (INDEPENDENT_AMBULATORY_CARE_PROVIDER_SITE_OTHER): Payer: 59

## 2017-10-03 ENCOUNTER — Encounter: Payer: Self-pay | Admitting: Orthopedic Surgery

## 2017-10-03 ENCOUNTER — Ambulatory Visit: Payer: 59 | Admitting: Orthopedic Surgery

## 2017-10-03 VITALS — BP 116/78 | HR 75 | Ht 64.0 in | Wt 216.0 lb

## 2017-10-03 DIAGNOSIS — G5603 Carpal tunnel syndrome, bilateral upper limbs: Secondary | ICD-10-CM

## 2017-10-03 DIAGNOSIS — M79642 Pain in left hand: Secondary | ICD-10-CM

## 2017-10-03 DIAGNOSIS — M79641 Pain in right hand: Secondary | ICD-10-CM | POA: Diagnosis not present

## 2017-10-03 MED ORDER — GABAPENTIN 100 MG PO CAPS: 100.0000 mg | ORAL_CAPSULE | Freq: Every day | ORAL | 2 refills | Status: DC

## 2017-10-03 NOTE — Patient Instructions (Signed)
b6 100mg  twice a day

## 2017-10-03 NOTE — Progress Notes (Signed)
NEW PATIENT OFFICE VISIT   Chief Complaint  Patient presents with  . Wrist Pain    bilateral     62 year old female presents for evaluation of bilateral hand pain.  She is a smoker diabetic and does a lot of typing  She reports a 3-year history of pain and paresthesias which have become more severe present over the last 3 years more severe over the last year, no prior treatment seems to get worse with increased activity and worse at night.  She denies any trauma  She also reports mild pain over the radial side of the wrist and hand.    Review of Systems  Constitutional: Negative for fever.  Musculoskeletal: Positive for myalgias.  Skin: Negative for rash.  Neurological: Positive for tingling and sensory change. Negative for tremors.     Past Medical History:  Diagnosis Date  . Anxiety   . Arthritis   . Asthma    related to bronchitis-no recent issues  . Depression   . Diabetes mellitus   . GERD (gastroesophageal reflux disease)   . Headache(784.0)   . Hypercholesterolemia   . Hypertension   . Peripheral neuropathy   . PONV (postoperative nausea and vomiting)   . Psoriasis     Past Surgical History:  Procedure Laterality Date  . CARDIAC CATHETERIZATION  2011-[place]  . CESAREAN SECTION  1984  . CHOLECYSTECTOMY  2003  . COLONOSCOPY  2009  . ENDOMETRIAL ABLATION  2006  . FOOT SURGERY  2006  . HAMMER TOE SURGERY  12/01/2011   Procedure: HAMMER TOE CORRECTION;  Surgeon: Dallas Schimke, DPM;  Location: [facility];  Service: Orthopedics;  Laterality: Right;  Arthroplasty 5th Digit Right Foot  . NECK SURGERY  x2-2004  . POSTERIOR LUMBAR FUSION N/A 02/01/2014   Procedure: POSTERIOR LUMBAR FUSION LUMBAR FIVE-SACRAL ONE INTERBODY FUSION WITH INTERBODY FUSION WITH INTERBODY PROSTHESIS POSTERIOR LATERAL ARTHRODESIS POSTERIOR NONSEGMENTAL INSTRUMENTATION;  Surgeon: Carmela Hurt, MD;  Location: [facility];  Service: Neurosurgery;  Laterality: N/A;  Lumbar 5 GILL procedure,  POSTERIOR LATERAL ARTHRODESIS POSTERIOR NONSEGMENTAL INSTRUMENTATION  . REPLACEMENT TOTAL KNEE BILATERAL  2007   [facility], Dr. Romeo Apple  . STERIOD INJECTION  07/08/2011   Procedure: STEROID INJECTION;  Surgeon: Marlowe Shores, MD;  Location: [facility];  Service: Orthopedics;  Laterality: Left;  injection 1ml celestone left index finger  . TUBAL LIGATION  1989  . WRIST ARTHROSCOPY  07/08/2011   Procedure: ARTHROSCOPY WRIST;  Surgeon: Marlowe Shores, MD;  Location: [facility];  Service: Orthopedics;  Laterality: Right;  right wrist with ganglionectomy,     Family History  Problem Relation Age of Onset  . Asthma Other   . COPD Other   . Hypertension Other   . Stroke Other   . Heart disease Other   . Heart disease Father   . Dementia Father   . Cancer Paternal Grandfather        lung  . Stroke Mother    Social History   Tobacco Use  . Smoking status: Current Every Day Smoker    Packs/day: 0.50    Years: 45.00    Pack years: 22.50    Types: Cigarettes  . Smokeless tobacco: Never Used  Substance Use Topics  . Alcohol use: No  . Drug use: No    @ALL @  Current Meds  Medication Sig  . aspirin EC 81 MG tablet Take 81 mg by mouth every morning.  . clobetasol cream (TEMOVATE) 0.05 %  Use 2-3 x weekly to affected area  . rosuvastatin (CRESTOR) 5 MG tablet Take 5 mg by mouth at bedtime.   . SitaGLIPtin-MetFORMIN HCl (JANUMET XR) [phone] MG TB24 Take by mouth daily.  . traZODone (DESYREL) 50 MG tablet Take 50 mg by mouth at bedtime.  . triamterene-hydrochlorothiazide (DYAZIDE) 37.5-25 MG per capsule Take 1 capsule by mouth every morning.    . Vortioxetine HBr (TRINTELLIX PO) Take by mouth daily.    BP 116/78   Pulse 75   Ht 5\' 4"  (1.626 m)   Wt 216 lb (98 kg)   BMI 37.08 kg/m   Physical Exam  Constitutional: She is oriented to person, place, and time. She appears well-developed and well-nourished.  Neurological: She is alert and oriented  to person, place, and time.  Psychiatric: She has a normal mood and affect. Judgment normal.  Vitals reviewed.   Ortho Exam Right and left wrist examination including forearms  Tenderness at the base of the thumb bilaterally with negative Finkelstein's test  First extensor compartment nontender  Overall the hands look fine without any swelling no malalignment.  Full passive and active range of motion with equal grip strength.  No instability is detected at the wrist there is pain over both carpal tunnels with positive compression test at 15 and 20 seconds respectively left to right.  Decreased sensation in the median nerve distribution in each wrist  Color and capillary refill normal bilaterally.  There is no epitrochlear lymph node involvement on either side  Skin warm dry intact bilaterally MEDICAL DECISION SECTION  xrays ordered? yes  My independent reading of xrays: See my dictated report however, in summation the patient has normal x-rays of the hand with no evidence of osteoarthritis at the base of the thumb   Encounter Diagnoses  Name Primary?  . Bilateral hand pain Yes  . Bilateral carpal tunnel syndrome      PLAN:   Meds ordered this encounter  Medications  . gabapentin (NEURONTIN) 100 MG capsule    Sig: Take 1 capsule (100 mg total) by mouth at bedtime.    Dispense:  42 capsule    Refill:  2   Injection? no MRI/CT/? No Ordered nerve conduction study to delineate the global hand numbness and determine the shape of the nerve after 3 years of carpal tunnel symptoms  I have told the patient she needs to keep her glucose under control She needs to stop smoking She cannot stop typing.  I placed her on gabapentin 100 mg of vitamin B6 for 6 weeks  I also told her that most likely we need to get the pressure off of this nerve so that she can have the best chance of some type of recovery as 1 year of compression leads to decreased success with carpal tunnel release  and if she continues to type her symptoms may come back   Fu post NCS

## 2017-10-12 MED FILL — JANUMET XR 100-1,000 MG TAB: 100-1000 | 90 days supply | Qty: 90 | Fill #0

## 2017-10-26 MED FILL — DEXILANT DR 60 MG CAPSULE: 60 | 30 days supply | Qty: 30 | Fill #1

## 2017-10-26 MED FILL — traZODone HCL 50 MG TABS: 50 | 90 days supply | Qty: 90 | Fill #1

## 2017-10-26 MED FILL — TRINTELLIX 20 MG TABLET: 20 | 90 days supply | Qty: 90 | Fill #1

## 2017-11-07 ENCOUNTER — Ambulatory Visit (INDEPENDENT_AMBULATORY_CARE_PROVIDER_SITE_OTHER): Payer: 59 | Admitting: Physical Medicine and Rehabilitation

## 2017-11-07 ENCOUNTER — Encounter (INDEPENDENT_AMBULATORY_CARE_PROVIDER_SITE_OTHER): Payer: Self-pay | Admitting: Physical Medicine and Rehabilitation

## 2017-11-07 DIAGNOSIS — R202 Paresthesia of skin: Secondary | ICD-10-CM | POA: Diagnosis not present

## 2017-11-07 NOTE — Progress Notes (Signed)
  Numeric Pain Rating Scale and Functional Assessment Average Pain 8   In the last MONTH (on 0-10 scale) has pain interfered with the following?  1. General activity like being  able to carry out your everyday physical activities such as walking, climbing stairs, carrying groceries, or moving a chair?  Rating(9)   .  

## 2017-11-07 NOTE — Progress Notes (Signed)
Dawn Kirk - 62 y.o. female MRN [id number]  Date of birth: [birth year]  Office Visit Note: Visit Date: 11/07/2017 PCP: Benita Stabile, MD Referred by: Benita Stabile, MD  Subjective: Chief Complaint  Patient presents with  . Right Hand - Numbness  . Left Hand - Numbness   HPI: Dawn Kirk is a 62 year old right-hand-dominant female who comes in today at the request of Dr. Fuller Canada for electrodiagnostic study of both upper extremities.  She reports chronic several month history of worsening numbness in both hands and pain in both wrists.  She reports that the left is really worse than the right.  She reports that all of the fingers feel the same.  She reports the same amount of numbness and tingling in the index finger as she does in the fifth digit bilaterally.  She reports that her hands "feel like lead "all the time.  She does get some nocturnal complaints and she has difficulty opening jars.  She does not endorse any specific injury or frank radicular type symptoms.  Her case is complicated by type 2 diabetes as well as anxiety and depression.  She does not carry a diagnosis of polyneuropathy.   ROS Otherwise per HPI.  Assessment & Plan: Visit Diagnoses:  1. Paresthesia of skin     Plan: No additional findings.  Impression: The above electrodiagnostic study is ABNORMAL and reveals evidence of a mild to moderate LEFT median nerve entrapment at the wrist (carpal tunnel syndrome) affecting sensory and motor components.   There is no significant electrodiagnostic evidence of any other focal nerve entrapment, brachial plexopathy or cervical radiculopathy.  As you know, this particular electrodiagnostic study cannot rule out chemical radiculitis or sensory only radiculopathy.    This electrodiagnostic study cannot rule out small fiber polyneuropathy and dysesthesias from central pain sensitization syndromes such as fibromyalgia.  Myotomal referral pain from trigger points is also  not excluded.   Recommendations: 1.  Follow-up with referring physician. 2.  Continue current management of symptoms. 3.  Continue use of resting splint at night-time and as needed during the day.    Meds & Orders: No orders of the defined types were placed in this encounter.   Orders Placed This Encounter  Procedures  . NCV with EMG (electromyography)    Follow-up: Return for Dr. Romeo Apple.   Procedures: No procedures performed  EMG & NCV Findings: Evaluation of the left median motor nerve showed decreased conduction velocity (Elbow-Wrist, 48 m/s).  The left median (across palm) sensory nerve showed prolonged distal peak latency (3.8 ms).  All remaining nerves (as indicated in the following tables) were within normal limits.  All left vs. right side differences were within normal limits.    All examined muscles (as indicated in the following table) showed no evidence of electrical instability.    Impression: The above electrodiagnostic study is ABNORMAL and reveals evidence of a mild to moderate LEFT median nerve entrapment at the wrist (carpal tunnel syndrome) affecting sensory and motor components.   There is no significant electrodiagnostic evidence of any other focal nerve entrapment, brachial plexopathy or cervical radiculopathy.  As you know, this particular electrodiagnostic study cannot rule out chemical radiculitis or sensory only radiculopathy.    This electrodiagnostic study cannot rule out small fiber polyneuropathy and dysesthesias from central pain sensitization syndromes such as fibromyalgia.  Myotomal referral pain from trigger points is also not excluded.   Recommendations: 1.  Follow-up with referring physician. 2.  Continue current  management of symptoms. 3.  Continue use of resting splint at night-time and as needed during the day.    Nerve Conduction Studies Anti Sensory Summary Table   Stim Site NR Peak (ms) Norm Peak (ms) P-T Amp (V) Norm P-T Amp  Site1 Site2 Delta-P (ms) Dist (cm) Vel (m/s) Norm Vel (m/s)  Left Median Acr Palm Anti Sensory (2nd Digit)  31.5C  Wrist    *3.8 <3.6 21.0 >10        Site 3    2.2  6.5         Right Median Acr Palm Anti Sensory (2nd Digit)  31.6C  Wrist    3.4 <3.6 23.5 >10 Wrist Palm 1.5 0.0    Palm    1.9 <2.0 42.3         Left Radial Anti Sensory (Base 1st Digit)  32C  Wrist    2.1 <3.1 30.8  Wrist Base 1st Digit 2.1 0.0    Right Radial Anti Sensory (Base 1st Digit)  32.5C  Wrist    2.2 <3.1 23.3  Wrist Base 1st Digit 2.2 0.0    Left Ulnar Anti Sensory (5th Digit)  31.9C  Wrist    3.6 <3.7 17.1 >15.0 Wrist 5th Digit 3.6 14.0 39 >38  Right Ulnar Anti Sensory (5th Digit)  32C  Wrist    3.6 <3.7 20.3 >15.0 Wrist 5th Digit 3.6 14.0 39 >38   Motor Summary Table   Stim Site NR Onset (ms) Norm Onset (ms) O-P Amp (mV) Norm O-P Amp Site1 Site2 Delta-0 (ms) Dist (cm) Vel (m/s) Norm Vel (m/s)  Left Median Motor (Abd Poll Brev)  32.2C  Wrist    4.1 <4.2 7.7 >5 Elbow Wrist 4.0 19.0 *48 >50  Elbow    8.1  6.6         Right Median Motor (Abd Poll Brev)  32.7C  Wrist    3.4 <4.2 8.1 >5 Elbow Wrist 3.7 18.5 50 >50  Elbow    7.1  5.5         Left Ulnar Motor (Abd Dig Min)  32.4C  Wrist    3.6 <4.2 10.5 >3 B Elbow Wrist 3.4 18.0 53 >53  B Elbow    7.0  9.1  A Elbow B Elbow 1.6 9.0 56 >53  A Elbow    8.6  9.9         Right Ulnar Motor (Abd Dig Min)  32.5C  Wrist    3.0 <4.2 10.9 >3 B Elbow Wrist 3.3 18.0 55 >53  B Elbow    6.3  10.6  A Elbow B Elbow 1.3 9.0 69 >53  A Elbow    7.6  10.4          EMG   Side Muscle Nerve Root Ins Act Fibs Psw Amp Dur Poly Recrt Int Dennie Bible Comment  Left Abd Poll Brev Median C8-T1 Nml Nml Nml Nml Nml 0 Nml Nml   Left 1stDorInt Ulnar C8-T1 Nml Nml Nml Nml Nml 0 Nml Nml   Left PronatorTeres Median C6-7 Nml Nml Nml Nml Nml 0 Nml Nml   Left Biceps Musculocut C5-6 Nml Nml Nml Nml Nml 0 Nml Nml   Left Deltoid Axillary C5-6 Nml Nml Nml Nml Nml 0 Nml Nml     Nerve Conduction  Studies Anti Sensory Left/Right Comparison   Stim Site L Lat (ms) R Lat (ms) L-R Lat (ms) L Amp (V) R Amp (V) L-R Amp (%) Site1 Site2 L Vel (  m/s) R Vel (m/s) L-R Vel (m/s)  Median Acr Palm Anti Sensory (2nd Digit)  31.5C  Wrist *3.8 3.4 0.4 21.0 23.5 10.6       Site 3 2.2   6.5         Radial Anti Sensory (Base 1st Digit)  32C  Wrist 2.1 2.2 0.1 30.8 23.3 24.4 Wrist Base 1st Digit     Ulnar Anti Sensory (5th Digit)  31.9C  Wrist 3.6 3.6 0.0 17.1 20.3 15.8 Wrist 5th Digit 39 39 0   Motor Left/Right Comparison   Stim Site L Lat (ms) R Lat (ms) L-R Lat (ms) L Amp (mV) R Amp (mV) L-R Amp (%) Site1 Site2 L Vel (m/s) R Vel (m/s) L-R Vel (m/s)  Median Motor (Abd Poll Brev)  32.2C  Wrist 4.1 3.4 0.7 7.7 8.1 4.9 Elbow Wrist *48 50 2  Elbow 8.1 7.1 1.0 6.6 5.5 16.7       Ulnar Motor (Abd Dig Min)  32.4C  Wrist 3.6 3.0 0.6 10.5 10.9 3.7 B Elbow Wrist 53 55 2  B Elbow 7.0 6.3 0.7 9.1 10.6 14.2 A Elbow B Elbow 56 69 13  A Elbow 8.6 7.6 1.0 9.9 10.4 4.8          Waveforms:                     Clinical History: No specialty comments available.   She reports that she has been smoking cigarettes.  She has a 22.50 pack-year smoking history. She has never used smokeless tobacco. No results for input(s): HGBA1C, LABURIC in the last 8760 hours.  Objective:  VS:  HT:    WT:   BMI:     BP:   HR: bpm  TEMP: ( )  RESP:  Physical Exam  Musculoskeletal:  Inspection reveals osteoarthritic changes but no atrophy of the bilateral APB or FDI or hand intrinsics. There is no swelling, color changes, allodynia or dystrophic changes. There is 5 out of 5 strength in the bilateral wrist extension, finger abduction and long finger flexion. There is intact sensation to light touch in all dermatomal and peripheral nerve distributions. There is a negative Hoffmann's test bilaterally.    Ortho Exam Imaging: No results found.  Past Medical/Family/Surgical/Social History: Medications & Allergies  reviewed per EMR, new medications updated. Patient Active Problem List   Diagnosis Date Noted  . Essential hypertension 05/08/2014  . Pure hypercholesterolemia 05/08/2014  . Diabetes type 2, controlled (HCC) 05/08/2014  . Spondylolisthesis at L5-S1 level 02/01/2014  . KNEE PAIN 07/10/2007  . PES ANSERINUS TENDINITIS OR BURSITIS 07/10/2007   Past Medical History:  Diagnosis Date  . Anxiety   . Arthritis   . Asthma    related to bronchitis-no recent issues  . Depression   . Diabetes mellitus   . GERD (gastroesophageal reflux disease)   . Headache(784.0)   . Hypercholesterolemia   . Hypertension   . Peripheral neuropathy   . PONV (postoperative nausea and vomiting)   . Psoriasis    Family History  Problem Relation Age of Onset  . Asthma Other   . COPD Other   . Hypertension Other   . Stroke Other   . Heart disease Other   . Heart disease Father   . Dementia Father   . Cancer Paternal Grandfather        lung  . Stroke Mother    Past Surgical History:  Procedure Laterality Date  . CARDIAC CATHETERIZATION  2011-[place]  . CESAREAN SECTION  1984  . CHOLECYSTECTOMY  2003  . COLONOSCOPY  2009  . ENDOMETRIAL ABLATION  2006  . FOOT SURGERY  2006  . HAMMER TOE SURGERY  12/01/2011   Procedure: HAMMER TOE CORRECTION;  Surgeon: Dallas Schimke, DPM;  Location: [facility];  Service: Orthopedics;  Laterality: Right;  Arthroplasty 5th Digit Right Foot  . NECK SURGERY  x2-2004  . POSTERIOR LUMBAR FUSION N/A 02/01/2014   Procedure: POSTERIOR LUMBAR FUSION LUMBAR FIVE-SACRAL ONE INTERBODY FUSION WITH INTERBODY FUSION WITH INTERBODY PROSTHESIS POSTERIOR LATERAL ARTHRODESIS POSTERIOR NONSEGMENTAL INSTRUMENTATION;  Surgeon: Carmela Hurt, MD;  Location: [facility];  Service: Neurosurgery;  Laterality: N/A;  Lumbar 5 GILL procedure, POSTERIOR LATERAL ARTHRODESIS POSTERIOR NONSEGMENTAL INSTRUMENTATION  . REPLACEMENT TOTAL KNEE BILATERAL  2007   [facility], Dr. Romeo Apple  . STERIOD INJECTION   07/08/2011   Procedure: STEROID INJECTION;  Surgeon: Marlowe Shores, MD;  Location: [facility];  Service: Orthopedics;  Laterality: Left;  injection 1ml celestone left index finger  . TUBAL LIGATION  1989  . WRIST ARTHROSCOPY  07/08/2011   Procedure: ARTHROSCOPY WRIST;  Surgeon: Marlowe Shores, MD;  Location: [facility];  Service: Orthopedics;  Laterality: Right;  right wrist with ganglionectomy,    Social History   Occupational History  . Not on file  Tobacco Use  . Smoking status: Current Every Day Smoker    Packs/day: 0.50    Years: 45.00    Pack years: 22.50    Types: Cigarettes  . Smokeless tobacco: Never Used  Substance and Sexual Activity  . Alcohol use: No  . Drug use: No  . Sexual activity: Yes    Birth control/protection: Surgical    Comment: tubal and ablation

## 2017-11-08 NOTE — Procedures (Signed)
EMG & NCV Findings: Evaluation of the left median motor nerve showed decreased conduction velocity (Elbow-Wrist, 48 m/s).  The left median (across palm) sensory nerve showed prolonged distal peak latency (3.8 ms).  All remaining nerves (as indicated in the following tables) were within normal limits.  All left vs. right side differences were within normal limits.    All examined muscles (as indicated in the following table) showed no evidence of electrical instability.    Impression: The above electrodiagnostic study is ABNORMAL and reveals evidence of a mild to moderate LEFT median nerve entrapment at the wrist (carpal tunnel syndrome) affecting sensory and motor components.   There is no significant electrodiagnostic evidence of any other focal nerve entrapment, brachial plexopathy or cervical radiculopathy.  As you know, this particular electrodiagnostic study cannot rule out chemical radiculitis or sensory only radiculopathy.    This electrodiagnostic study cannot rule out small fiber polyneuropathy and dysesthesias from central pain sensitization syndromes such as fibromyalgia.  Myotomal referral pain from trigger points is also not excluded.   Recommendations: 1.  Follow-up with referring physician. 2.  Continue current management of symptoms. 3.  Continue use of resting splint at night-time and as needed during the day.    Nerve Conduction Studies Anti Sensory Summary Table   Stim Site NR Peak (ms) Norm Peak (ms) P-T Amp (V) Norm P-T Amp Site1 Site2 Delta-P (ms) Dist (cm) Vel (m/s) Norm Vel (m/s)  Left Median Acr Palm Anti Sensory (2nd Digit)  31.5C  Wrist    *3.8 <3.6 21.0 >10        Site 3    2.2  6.5         Right Median Acr Palm Anti Sensory (2nd Digit)  31.6C  Wrist    3.4 <3.6 23.5 >10 Wrist Palm 1.5 0.0    Palm    1.9 <2.0 42.3         Left Radial Anti Sensory (Base 1st Digit)  32C  Wrist    2.1 <3.1 30.8  Wrist Base 1st Digit 2.1 0.0    Right Radial Anti Sensory  (Base 1st Digit)  32.5C  Wrist    2.2 <3.1 23.3  Wrist Base 1st Digit 2.2 0.0    Left Ulnar Anti Sensory (5th Digit)  31.9C  Wrist    3.6 <3.7 17.1 >15.0 Wrist 5th Digit 3.6 14.0 39 >38  Right Ulnar Anti Sensory (5th Digit)  32C  Wrist    3.6 <3.7 20.3 >15.0 Wrist 5th Digit 3.6 14.0 39 >38   Motor Summary Table   Stim Site NR Onset (ms) Norm Onset (ms) O-P Amp (mV) Norm O-P Amp Site1 Site2 Delta-0 (ms) Dist (cm) Vel (m/s) Norm Vel (m/s)  Left Median Motor (Abd Poll Brev)  32.2C  Wrist    4.1 <4.2 7.7 >5 Elbow Wrist 4.0 19.0 *48 >50  Elbow    8.1  6.6         Right Median Motor (Abd Poll Brev)  32.7C  Wrist    3.4 <4.2 8.1 >5 Elbow Wrist 3.7 18.5 50 >50  Elbow    7.1  5.5         Left Ulnar Motor (Abd Dig Min)  32.4C  Wrist    3.6 <4.2 10.5 >3 B Elbow Wrist 3.4 18.0 53 >53  B Elbow    7.0  9.1  A Elbow B Elbow 1.6 9.0 56 >53  A Elbow    8.6  9.9  Right Ulnar Motor (Abd Dig Min)  32.5C  Wrist    3.0 <4.2 10.9 >3 B Elbow Wrist 3.3 18.0 55 >53  B Elbow    6.3  10.6  A Elbow B Elbow 1.3 9.0 69 >53  A Elbow    7.6  10.4          EMG   Side Muscle Nerve Root Ins Act Fibs Psw Amp Dur Poly Recrt Int Dennie Bible Comment  Left Abd Poll Brev Median C8-T1 Nml Nml Nml Nml Nml 0 Nml Nml   Left 1stDorInt Ulnar C8-T1 Nml Nml Nml Nml Nml 0 Nml Nml   Left PronatorTeres Median C6-7 Nml Nml Nml Nml Nml 0 Nml Nml   Left Biceps Musculocut C5-6 Nml Nml Nml Nml Nml 0 Nml Nml   Left Deltoid Axillary C5-6 Nml Nml Nml Nml Nml 0 Nml Nml     Nerve Conduction Studies Anti Sensory Left/Right Comparison   Stim Site L Lat (ms) R Lat (ms) L-R Lat (ms) L Amp (V) R Amp (V) L-R Amp (%) Site1 Site2 L Vel (m/s) R Vel (m/s) L-R Vel (m/s)  Median Acr Palm Anti Sensory (2nd Digit)  31.5C  Wrist *3.8 3.4 0.4 21.0 23.5 10.6       Site 3 2.2   6.5         Radial Anti Sensory (Base 1st Digit)  32C  Wrist 2.1 2.2 0.1 30.8 23.3 24.4 Wrist Base 1st Digit     Ulnar Anti Sensory (5th Digit)  31.9C  Wrist 3.6 3.6  0.0 17.1 20.3 15.8 Wrist 5th Digit 39 39 0   Motor Left/Right Comparison   Stim Site L Lat (ms) R Lat (ms) L-R Lat (ms) L Amp (mV) R Amp (mV) L-R Amp (%) Site1 Site2 L Vel (m/s) R Vel (m/s) L-R Vel (m/s)  Median Motor (Abd Poll Brev)  32.2C  Wrist 4.1 3.4 0.7 7.7 8.1 4.9 Elbow Wrist *48 50 2  Elbow 8.1 7.1 1.0 6.6 5.5 16.7       Ulnar Motor (Abd Dig Min)  32.4C  Wrist 3.6 3.0 0.6 10.5 10.9 3.7 B Elbow Wrist 53 55 2  B Elbow 7.0 6.3 0.7 9.1 10.6 14.2 A Elbow B Elbow 56 69 13  A Elbow 8.6 7.6 1.0 9.9 10.4 4.8          Waveforms:

## 2017-11-20 MED FILL — DEXILANT DR 60 MG CAPSULE: 60 | 30 days supply | Qty: 30 | Fill #2

## 2017-11-20 MED FILL — TRIAMTERENE/HCTZ 37.5/25 CP: 37.5-25 | 90 days supply | Qty: 90 | Fill #1

## 2017-11-24 ENCOUNTER — Encounter: Payer: Self-pay | Admitting: Orthopedic Surgery

## 2017-11-24 ENCOUNTER — Ambulatory Visit: Payer: 59 | Admitting: Orthopedic Surgery

## 2017-11-24 ENCOUNTER — Telehealth: Payer: Self-pay | Admitting: Radiology

## 2017-11-24 VITALS — BP 110/70 | HR 65 | Ht 64.0 in | Wt 216.0 lb

## 2017-11-24 DIAGNOSIS — G5603 Carpal tunnel syndrome, bilateral upper limbs: Secondary | ICD-10-CM | POA: Diagnosis not present

## 2017-11-24 NOTE — Telephone Encounter (Signed)
I left message for Dr. Margo Aye office to call back about preop labs patient is having so we do not duplicate any labs for her with upcoming CTR / to schedule for May 9th.

## 2017-11-24 NOTE — Progress Notes (Signed)
Progress Note   Patient ID: Dawn Kirk, female   DOB: 03/09/1956, 62 y.o.   MRN: [id number] Chief Complaint  Patient presents with  . Follow-up    NCS results     She is a 62 year old female who presented for evaluation of bilateral hand pain she is a smoker and diabetic and does a lot of typing she reported a 3-year history of pain and paresthesias which worsened and became more severe over the last 3 years including worsening over the last year with no prior treatment and seems to get worse with increased activity and worse at night she denied any trauma she complained of mild pain over the radial side of the wrist and hand as well  She did wear some braces at one point she does not want to do that anymore she wants to have surgery on the wrist and I agree with her especially with the motor findings on the nerve study see below    Review of Systems  Constitutional: Negative for chills and fever.  Respiratory: Positive for cough.   Cardiovascular: Negative for chest pain.   No outpatient medications have been marked as taking for the 11/24/17 encounter (Office Visit) with Vickki Hearing, MD.    Past Medical History:  Diagnosis Date  . Anxiety   . Arthritis   . Asthma    related to bronchitis-no recent issues  . Depression   . Diabetes mellitus   . GERD (gastroesophageal reflux disease)   . Headache(784.0)   . Hypercholesterolemia   . Hypertension   . Peripheral neuropathy   . PONV (postoperative nausea and vomiting)   . Psoriasis    Past Surgical History:  Procedure Laterality Date  . CARDIAC CATHETERIZATION  2011-[place]  . CESAREAN SECTION  1984  . CHOLECYSTECTOMY  2003  . COLONOSCOPY  2009  . ENDOMETRIAL ABLATION  2006  . FOOT SURGERY  2006  . HAMMER TOE SURGERY  12/01/2011   Procedure: HAMMER TOE CORRECTION;  Surgeon: Dallas Schimke, DPM;  Location: [facility];  Service: Orthopedics;  Laterality: Right;  Arthroplasty 5th Digit Right Foot  . NECK  SURGERY  x2-2004  . POSTERIOR LUMBAR FUSION N/A 02/01/2014   Procedure: POSTERIOR LUMBAR FUSION LUMBAR FIVE-SACRAL ONE INTERBODY FUSION WITH INTERBODY FUSION WITH INTERBODY PROSTHESIS POSTERIOR LATERAL ARTHRODESIS POSTERIOR NONSEGMENTAL INSTRUMENTATION;  Surgeon: Carmela Hurt, MD;  Location: [facility];  Service: Neurosurgery;  Laterality: N/A;  Lumbar 5 GILL procedure, POSTERIOR LATERAL ARTHRODESIS POSTERIOR NONSEGMENTAL INSTRUMENTATION  . REPLACEMENT TOTAL KNEE BILATERAL  2007   [facility], Dr. Romeo Apple  . STERIOD INJECTION  07/08/2011   Procedure: STEROID INJECTION;  Surgeon: Marlowe Shores, MD;  Location: [facility];  Service: Orthopedics;  Laterality: Left;  injection 1ml celestone left index finger  . TUBAL LIGATION  1989  . WRIST ARTHROSCOPY  07/08/2011   Procedure: ARTHROSCOPY WRIST;  Surgeon: Marlowe Shores, MD;  Location: [facility];  Service: Orthopedics;  Laterality: Right;  right wrist with ganglionectomy,    Past Medical History:  Diagnosis Date  . Anxiety   . Arthritis   . Asthma    related to bronchitis-no recent issues  . Depression   . Diabetes mellitus   . GERD (gastroesophageal reflux disease)   . Headache(784.0)   . Hypercholesterolemia   . Hypertension   . Peripheral neuropathy   . PONV (postoperative nausea and vomiting)   . Psoriasis    Social History   Tobacco Use  .  Smoking status: Current Every Day Smoker    Packs/day: 0.50    Years: 45.00    Pack years: 22.50    Types: Cigarettes  . Smokeless tobacco: Never Used  Substance Use Topics  . Alcohol use: No  . Drug use: No     Allergies  Allergen Reactions  . Codeine Other (See Comments)    REACTION: hallucinations  . Levofloxacin Nausea And Vomiting  . Adhesive [Tape] Itching and Rash    BP 110/70   Pulse 65   Ht  (1.626 m)   Wt 216 lb (98 kg)   BMI 37.08 kg/m    Physical Exam  Constitutional: She is oriented to person, place, and time. She appears  well-developed and well-nourished.  Neurological: She is alert and oriented to person, place, and time.  Psychiatric: She has a normal mood and affect. Judgment normal.  Vitals reviewed.   Ortho Exam  BP 110/70   Pulse 65   Ht  (1.626 m)   Wt 216 lb (98 kg)   BMI 37.08 kg/m    Ortho Exam Right and left wrist examination including forearms  Tenderness at the base of the thumb bilaterally with negative Finkelstein's test  First extensor compartment nontender  Overall the hands look fine without any swelling no malalignment.  Full passive and active range of motion with equal grip strength.  No instability is detected at the wrist there is pain over both carpal tunnels with positive compression test at 15 and 20 seconds respectively left to right.  Decreased sensation in the median nerve distribution in each wrist  Color and capillary refill normal bilaterally.  There is no epitrochlear lymph node involvement on either side  Skin warm dry intact bilaterally   Encounter Diagnosis  Name Primary?  .  Left carpal tunnel syndrome Yes    A nerve conduction study report has been done by [facility] orthopedic physician Dr. Ronne Binning  This indicates abnormal electrodiagnostic study mild to moderate left median nerve entrapment at the wrists affecting sensory and motor components the motor nerve showed decreased conduction velocity elbow to wrist 48 m/s the left median across the palm sensory nerve showed prolonged distal peak latency 3.8 ms    PLAN: After discussion with the patient she does not want to continue with nonoperative treatment including splinting so we will proceed with an open left carpal tunnel release  The procedure has been fully reviewed with the patient; The risks and benefits of surgery have been discussed and explained and understood. Alternative treatment has also been reviewed, questions were encouraged and answered. The postoperative plan is also been  reviewed.    My independent reading of xrays: See my dictated report however, in summation the patient has normal x-rays of the hand with no evidence of osteoarthritis at the base of the thumb   Encounter Diagnosis  Name Primary?  . Bilateral carpal tunnel syndrome Yes    Fuller Canada, MD 11/24/2017 12:29 PM

## 2017-11-24 NOTE — Patient Instructions (Signed)
Carpal Tunnel Release Carpal tunnel release is a surgical procedure to relieve numbness and pain in your hand that are caused by carpal tunnel syndrome. Your carpal tunnel is a narrow, hollow space in your wrist. It passes between your wrist bones and a band of connective tissue (transverse carpal ligament). The nerve that supplies most of your hand (median nerve) passes through this space, and so do the connections between your fingers and the muscles of your arm (tendons). Carpal tunnel syndrome makes this space swell and become narrow, and this causes pain and numbness. In carpal tunnel release surgery, a surgeon cuts through the transverse carpal ligament to make more room in the carpal tunnel space. You may have this surgery if other types of treatment have not worked. Tell a health care provider about:  Any allergies you have.  All medicines you are taking, including vitamins, herbs, eye drops, creams, and over-the-counter medicines.  Any problems you or family members have had with anesthetic medicines.  Any blood disorders you have.  Any surgeries you have had.  Any medical conditions you have. What are the risks? Generally, this is a safe procedure. However, problems may occur, including:  Bleeding.  Infection.  Injury to the median nerve.  Need for additional surgery.  What happens before the procedure?  Ask your health care provider about: ? Changing or stopping your regular medicines. This is especially important if you are taking diabetes medicines or blood thinners. ? Taking medicines such as aspirin and ibuprofen. These medicines can thin your blood. Do not take these medicines before your procedure if your health care provider instructs you not to.  Do not eat or drink anything after midnight on the night before the procedure or as directed by your health care provider.  Plan to have someone take you home after the procedure. What happens during the  procedure?  An IV tube may be inserted into a vein.  You will be given one of the following: ? A medicine that numbs the wrist area (local anesthetic). You may also be given a medicine to make you relax (sedative). ? A medicine that makes you go to sleep (general anesthetic).  Your arm, hand, and wrist will be cleaned with a germ-killing solution (antiseptic).  Your surgeon will make a surgical cut (incision) over the palm side of your wrist. The surgeon will pull aside the skin of your wrist to expose the carpal tunnel space.  The surgeon will cut the transverse carpal ligament.  The edges of the incision will be closed with stitches (sutures) or staples.  A bandage (dressing) will be placed over your wrist and wrapped around your hand and wrist. What happens after the procedure?  You may spend some time in a recovery area.  Your blood pressure, heart rate, breathing rate, and blood oxygen level will be monitored often until the medicines you were given have worn off.  You will likely have some pain. You will be given pain medicine.  You may need to wear a splint or a wrist brace over your dressing. This information is not intended to replace advice given to you by your health care provider. Make sure you discuss any questions you have with your health care provider. Document Released: 10/01/2003 Document Revised: 12/17/2015 Document Reviewed: 02/26/2014 Elsevier Interactive Patient Education  2018 [employer]  

## 2017-11-24 NOTE — Addendum Note (Signed)
Addended by: Fuller Canada E on: 11/24/2017 01:36 PM   Modules accepted: Orders, SmartSet

## 2017-11-27 NOTE — Telephone Encounter (Signed)
Ok does she know ?  Do we need to order anything else

## 2017-11-27 NOTE — Telephone Encounter (Signed)
PCP has ordered for her to have CBC CMP, Albumin/Creat., Lipids, and A1C but not until 12/01/17, nurse stated if she wants these earlier, she can come there earlier (Dr Catalina Pizza office)

## 2017-11-27 NOTE — Telephone Encounter (Signed)
I spoke to her, she wants [place] to order the other labs for her, ok to put in the orders?

## 2017-11-27 NOTE — Patient Instructions (Signed)
Dawn Kirk  11/27/2017     @   Your procedure is scheduled on  11/30/2017   Report to [facility] at  615  A.M.  Call this number if you have problems the morning of surgery:  [phone]   Remember:  Do not eat food or drink liquids after midnight.  Take these medicines the morning of surgery with A SIP OF WATER  Dexilant, vortioxetine, dyazide. Use your inhaler before you come and bring your rescue inhaler with you if you have one.   Do not wear jewelry, make-up or nail polish.  Do not wear lotions, powders, or perfumes, or deodorant.  Do not shave 48 hours prior to surgery.  Men may shave face and neck.  Do not bring valuables to the hospital.  [facility] is not responsible for any belongings or valuables.  Contacts, dentures or bridgework may not be worn into surgery.  Leave your suitcase in the car.  After surgery it may be brought to your room.  For patients admitted to the hospital, discharge time will be determined by your treatment team.  Patients discharged the day of surgery will not be allowed to drive home.   Name and phone number of your driver:   family Special instructions:  None  Please read over the following fact sheets that you were given. Anesthesia Post-op Instructions and Care and Recovery After Surgery       Carpal Tunnel Release Carpal tunnel release is a surgical procedure to relieve numbness and pain in your hand that are caused by carpal tunnel syndrome. Your carpal tunnel is a narrow, hollow space in your wrist. It passes between your wrist bones and a band of connective tissue (transverse carpal ligament). The nerve that supplies most of your hand (median nerve) passes through this space, and so do the connections between your fingers and the muscles of your arm (tendons). Carpal tunnel syndrome makes this space swell and become narrow, and this causes pain and numbness. In carpal tunnel release surgery, a  surgeon cuts through the transverse carpal ligament to make more room in the carpal tunnel space. You may have this surgery if other types of treatment have not worked. Tell a health care provider about:  Any allergies you have.  All medicines you are taking, including vitamins, herbs, eye drops, creams, and over-the-counter medicines.  Any problems you or family members have had with anesthetic medicines.  Any blood disorders you have.  Any surgeries you have had.  Any medical conditions you have. What are the risks? Generally, this is a safe procedure. However, problems may occur, including:  Bleeding.  Infection.  Injury to the median nerve.  Need for additional surgery.  What happens before the procedure?  Ask your health care provider about: ? Changing or stopping your regular medicines. This is especially important if you are taking diabetes medicines or blood thinners. ? Taking medicines such as aspirin and ibuprofen. These medicines can thin your blood. Do not take these medicines before your procedure if your health care provider instructs you not to.  Do not eat or drink anything after midnight on the night before the procedure or as directed by your health care provider.  Plan to have someone take you home after the procedure. What happens during the procedure?  An IV tube may be inserted into a vein.  You will be given one of the following: ? A medicine  that numbs the wrist area (local anesthetic). You may also be given a medicine to make you relax (sedative). ? A medicine that makes you go to sleep (general anesthetic).  Your arm, hand, and wrist will be cleaned with a germ-killing solution (antiseptic).  Your surgeon will make a surgical cut (incision) over the palm side of your wrist. The surgeon will pull aside the skin of your wrist to expose the carpal tunnel space.  The surgeon will cut the transverse carpal ligament.  The edges of the incision will  be closed with stitches (sutures) or staples.  A bandage (dressing) will be placed over your wrist and wrapped around your hand and wrist. What happens after the procedure?  You may spend some time in a recovery area.  Your blood pressure, heart rate, breathing rate, and blood oxygen level will be monitored often until the medicines you were given have worn off.  You will likely have some pain. You will be given pain medicine.  You may need to wear a splint or a wrist brace over your dressing. This information is not intended to replace advice given to you by your health care provider. Make sure you discuss any questions you have with your health care provider. Document Released: 10/01/2003 Document Revised: 12/17/2015 Document Reviewed: 02/26/2014 Elsevier Interactive Patient Education  2018 Elsevier [employer]. Carpal Tunnel Release, Care After Refer to this sheet in the next few weeks. These instructions provide you with information about caring for yourself after your procedure. Your health care provider may also give you more specific instructions. Your treatment has been planned according to current medical practices, but problems sometimes occur. Call your health care provider if you have any problems or questions after your procedure. What can I expect after the procedure? After your procedure, it is typical to have the following:  Pain.  Numbness.  Tingling.  Swelling.  Stiffness.  Bruising.  Follow these instructions at home:  Take medicines only as directed by your health care provider.  There are many different ways to close and cover an incision, including stitches (sutures), skin glue, and adhesive strips. Follow your health care provider's instructions about: ? Incision care. ? Bandage (dressing) changes and removal. ? Incision closure removal.  Wear a splint or a brace as directed by your surgeon. You may need to do this for 2-3 weeks.  Keep your hand raised  (elevated) above the level of your heart while you are resting. Move your fingers often.  Avoid activities that cause hand pain.  Ask your surgeon when you can start to do all of your usual activities again, such as: ? Driving. ? Returning to work. ? Bathing and swimming.  Keep all follow-up visits as directed by your health care provider. This is important. You may need physical therapy for several months to speed healing and regain movement. Contact a health care provider if:  You have drainage, redness, swelling, or pain at your incision site.  You have a fever.  You have chills.  Your pain medicine is not working.  Your symptoms do not go away after 2 months.  Your symptoms go away and then return. Get help right away if:  You have pain or numbness that is getting worse.  Your fingers change color.  You are not able to move your fingers. This information is not intended to replace advice given to you by your health care provider. Make sure you discuss any questions you have with your health care provider.  Document Released: 01/28/2005 Document Revised: 12/17/2015 Document Reviewed: 02/26/2014 Elsevier Interactive Patient Education  2018 [employer] Inc.  Monitored Anesthesia Care Anesthesia is a term that refers to techniques, procedures, and medicines that help a person stay safe and comfortable during a medical procedure. Monitored anesthesia care, or sedation, is one type of anesthesia. Your anesthesia specialist may recommend sedation if you will be having a procedure that does not require you to be unconscious, such as:  Cataract surgery.  A dental procedure.  A biopsy.  A colonoscopy.  During the procedure, you may receive a medicine to help you relax (sedative). There are three levels of sedation:  Mild sedation. At this level, you may feel awake and relaxed. You will be able to follow directions.  Moderate sedation. At this level, you will be sleepy. You may  not remember the procedure.  Deep sedation. At this level, you will be asleep. You will not remember the procedure.  The more medicine you are given, the deeper your level of sedation will be. Depending on how you respond to the procedure, the anesthesia specialist may change your level of sedation or the type of anesthesia to fit your needs. An anesthesia specialist will monitor you closely during the procedure. Let your health care provider know about:  Any allergies you have.  All medicines you are taking, including vitamins, herbs, eye drops, creams, and over-the-counter medicines.  Any use of steroids (by mouth or as a cream).  Any problems you or family members have had with sedatives and anesthetic medicines.  Any blood disorders you have.  Any surgeries you have had.  Any medical conditions you have, such as sleep apnea.  Whether you are pregnant or may be pregnant.  Any use of cigarettes, alcohol, or street drugs. What are the risks? Generally, this is a safe procedure. However, problems may occur, including:  Getting too much medicine (oversedation).  Nausea.  Allergic reaction to medicines.  Trouble breathing. If this happens, a breathing tube may be used to help with breathing. It will be removed when you are awake and breathing on your own.  Heart trouble.  Lung trouble.  Before the procedure Staying hydrated Follow instructions from your health care provider about hydration, which may include:  Up to 2 hours before the procedure - you may continue to drink clear liquids, such as water, clear fruit juice, black coffee, and plain tea.  Eating and drinking restrictions Follow instructions from your health care provider about eating and drinking, which may include:  8 hours before the procedure - stop eating heavy meals or foods such as meat, fried foods, or fatty foods.  6 hours before the procedure - stop eating light meals or foods, such as toast or  cereal.  6 hours before the procedure - stop drinking milk or drinks that contain milk.  2 hours before the procedure - stop drinking clear liquids.  Medicines Ask your health care provider about:  Changing or stopping your regular medicines. This is especially important if you are taking diabetes medicines or blood thinners.  Taking medicines such as aspirin and ibuprofen. These medicines can thin your blood. Do not take these medicines before your procedure if your health care provider instructs you not to.  Tests and exams  You will have a physical exam.  You may have blood tests done to show: ? How well your kidneys and liver are working. ? How well your blood can clot.  General instructions  Plan to have  someone take you home from the hospital or clinic.  If you will be going home right after the procedure, plan to have someone with you for 24 hours.  What happens during the procedure?  Your blood pressure, heart rate, breathing, level of pain and overall condition will be monitored.  An IV tube will be inserted into one of your veins.  Your anesthesia specialist will give you medicines as needed to keep you comfortable during the procedure. This may mean changing the level of sedation.  The procedure will be performed. After the procedure  Your blood pressure, heart rate, breathing rate, and blood oxygen level will be monitored until the medicines you were given have worn off.  Do not drive for 24 hours if you received a sedative.  You may: ? Feel sleepy, clumsy, or nauseous. ? Feel forgetful about what happened after the procedure. ? Have a sore throat if you had a breathing tube during the procedure. ? Vomit. This information is not intended to replace advice given to you by your health care provider. Make sure you discuss any questions you have with your health care provider. Document Released: 04/06/2005 Document Revised: 12/18/2015 Document Reviewed:  11/01/2015 Elsevier Interactive Patient Education  2018 Elsevier Inc. Monitored Anesthesia Care, Care After These instructions provide you with information about caring for yourself after your procedure. Your health care provider may also give you more specific instructions. Your treatment has been planned according to current medical practices, but problems sometimes occur. Call your health care provider if you have any problems or questions after your procedure. What can I expect after the procedure? After your procedure, it is common to:  Feel sleepy for several hours.  Feel clumsy and have poor balance for several hours.  Feel forgetful about what happened after the procedure.  Have poor judgment for several hours.  Feel nauseous or vomit.  Have a sore throat if you had a breathing tube during the procedure.  Follow these instructions at home: For at least 24 hours after the procedure:   Do not: ? Participate in activities in which you could fall or become injured. ? Drive. ? Use heavy machinery. ? Drink alcohol. ? Take sleeping pills or medicines that cause drowsiness. ? Make important decisions or sign legal documents. ? Take care of children on your own.  Rest. Eating and drinking  Follow the diet that is recommended by your health care provider.  If you vomit, drink water, juice, or soup when you can drink without vomiting.  Make sure you have little or no nausea before eating solid foods. General instructions  Have a responsible adult stay with you until you are awake and alert.  Take over-the-counter and prescription medicines only as told by your health care provider.  If you smoke, do not smoke without supervision.  Keep all follow-up visits as told by your health care provider. This is important. Contact a health care provider if:  You keep feeling nauseous or you keep vomiting.  You feel light-headed.  You develop a rash.  You have a fever. Get  help right away if:  You have trouble breathing. This information is not intended to replace advice given to you by your health care provider. Make sure you discuss any questions you have with your health care provider. Document Released: 11/01/2015 Document Revised: 03/02/2016 Document Reviewed: 11/01/2015 Elsevier Interactive Patient Education  [employer].

## 2017-11-27 NOTE — Telephone Encounter (Signed)
Sorry, yes I have called her to advise and will plan with patient.   I have left message for patient to call me back about this.   Was FYI for you

## 2017-11-28 ENCOUNTER — Encounter (HOSPITAL_COMMUNITY): Payer: Self-pay

## 2017-11-28 ENCOUNTER — Telehealth: Payer: Self-pay | Admitting: Radiology

## 2017-11-28 ENCOUNTER — Other Ambulatory Visit: Payer: Self-pay

## 2017-11-28 ENCOUNTER — Encounter (HOSPITAL_COMMUNITY)
Admission: RE | Admit: 2017-11-28 | Discharge: 2017-11-28 | Disposition: A | Discharge status: Home / Self Care | Payer: 59 | Source: Ambulatory Visit | Attending: Orthopedic Surgery | Admitting: Orthopedic Surgery

## 2017-11-28 DIAGNOSIS — F17210 Nicotine dependence, cigarettes, uncomplicated: Secondary | ICD-10-CM | POA: Diagnosis not present

## 2017-11-28 DIAGNOSIS — I10 Essential (primary) hypertension: Secondary | ICD-10-CM | POA: Diagnosis not present

## 2017-11-28 DIAGNOSIS — Z885 Allergy status to narcotic agent status: Secondary | ICD-10-CM | POA: Diagnosis not present

## 2017-11-28 DIAGNOSIS — E1140 Type 2 diabetes mellitus with diabetic neuropathy, unspecified: Secondary | ICD-10-CM | POA: Diagnosis not present

## 2017-11-28 DIAGNOSIS — Z888 Allergy status to other drugs, medicaments and biological substances status: Secondary | ICD-10-CM | POA: Diagnosis not present

## 2017-11-28 DIAGNOSIS — J45909 Unspecified asthma, uncomplicated: Secondary | ICD-10-CM | POA: Diagnosis not present

## 2017-11-28 DIAGNOSIS — Z96653 Presence of artificial knee joint, bilateral: Secondary | ICD-10-CM | POA: Diagnosis not present

## 2017-11-28 DIAGNOSIS — G5602 Carpal tunnel syndrome, left upper limb: Secondary | ICD-10-CM | POA: Diagnosis not present

## 2017-11-28 LAB — HEMOGLOBIN A1C
Hgb A1c MFr Bld: 6.2 % — ABNORMAL HIGH (ref 4.8–5.6)
Mean Plasma Glucose: 131.24 mg/dL

## 2017-11-28 LAB — BASIC METABOLIC PANEL
Anion gap: 13 (ref 5–15)
BUN: 15 mg/dL (ref 6–20)
CALCIUM: 9.4 mg/dL (ref 8.9–10.3)
CO2: 31 mmol/L (ref 22–32)
CREATININE: 1.11 mg/dL — AB (ref 0.44–1.00)
Chloride: 94 mmol/L — ABNORMAL LOW (ref 101–111)
GFR calc Af Amer: >60 mL/min (ref >60)
GFR calc non Af Amer: 53 mL/min — ABNORMAL LOW (ref >60)
GLUCOSE: 118 mg/dL — AB (ref 65–99)
Potassium: 3.6 mmol/L (ref 3.5–5.1)
Sodium: 138 mmol/L (ref 135–145)

## 2017-11-28 LAB — CBC WITH DIFFERENTIAL/PLATELET
BASOS ABS: 0 10*3/uL (ref 0.0–0.1)
BASOS PCT: 0 %
EOS PCT: 4 %
Eosinophils Absolute: 0.3 10*3/uL (ref 0.0–0.7)
HEMATOCRIT: 47.8 % — AB (ref 36.0–46.0)
Hemoglobin: 15.5 g/dL — ABNORMAL HIGH (ref 12.0–15.0)
Lymphocytes Relative: 20 %
Lymphs Abs: 1.8 10*3/uL (ref 0.7–4.0)
MCH: 26.5 pg (ref 26.0–34.0)
MCHC: 32.4 g/dL (ref 30.0–36.0)
MCV: 81.6 fL (ref 78.0–100.0)
MONO ABS: 0.6 10*3/uL (ref 0.1–1.0)
Monocytes Relative: 6 %
NEUTROS ABS: 6.3 10*3/uL (ref 1.7–7.7)
Neutrophils Relative %: 70 %
PLATELETS: 252 10*3/uL (ref 150–400)
RBC: 5.86 MIL/uL — ABNORMAL HIGH (ref 3.87–5.11)
RDW: 15.7 % — AB (ref 11.5–15.5)
WBC: 9.1 10*3/uL (ref 4.0–10.5)

## 2017-11-28 LAB — GLUCOSE, CAPILLARY: Glucose-Capillary: 113 mg/dL — ABNORMAL HIGH (ref 65–99)

## 2017-11-28 NOTE — Telephone Encounter (Signed)
Called today and spoke to Angie at [facility] no authorization is required for the [facility] CPT [phone] the Ref number for the call is [phone]

## 2017-11-29 NOTE — H&P (Signed)
Patient ID: Dawn Kirk, female   DOB: [birth year], 62 y.o.   MRN: [id number]     Chief Complaint  Patient presents with  . Follow-up      NCS results        She is a 62 year old female who presented for evaluation of bilateral hand pain she is a smoker and diabetic and does a lot of typing she reported a 3-year history of pain and paresthesias which worsened and became more severe over the last 3 years including worsening over the last year with no prior treatment and seems to get worse with increased activity and worse at night she denied any trauma she complained of mild pain over the radial side of the wrist and hand as well   She did wear some braces at one point she does not want to do that anymore she wants to have surgery on the wrist and I agree with her especially with the motor findings on the nerve study see below       Review of Systems  Constitutional: Negative for chills and fever.  Respiratory: Positive for cough.   Cardiovascular: Negative for chest pain.    Active Medications  No outpatient medications have been marked as taking for the 11/24/17 encounter (Office Visit) with Vickki Hearing, MD.            Past Medical History:  Diagnosis Date  . Anxiety    . Arthritis    . Asthma      related to bronchitis-no recent issues  . Depression    . Diabetes mellitus    . GERD (gastroesophageal reflux disease)    . Headache(784.0)    . Hypercholesterolemia    . Hypertension    . Peripheral neuropathy    . PONV (postoperative nausea and vomiting)    . Psoriasis           Past Surgical History:  Procedure Laterality Date  . CARDIAC CATHETERIZATION   2011-[place]  . CESAREAN SECTION   1984  . CHOLECYSTECTOMY   2003  . COLONOSCOPY   2009  . ENDOMETRIAL ABLATION   2006  . FOOT SURGERY   2006  . HAMMER TOE SURGERY   12/01/2011    Procedure: HAMMER TOE CORRECTION;  Surgeon: Dallas Schimke, DPM;  Location: [facility];  Service: Orthopedics;   Laterality: Right;  Arthroplasty 5th Digit Right Foot  . NECK SURGERY   x2-2004  . POSTERIOR LUMBAR FUSION N/A 02/01/2014    Procedure: POSTERIOR LUMBAR FUSION LUMBAR FIVE-SACRAL ONE INTERBODY FUSION WITH INTERBODY FUSION WITH INTERBODY PROSTHESIS POSTERIOR LATERAL ARTHRODESIS POSTERIOR NONSEGMENTAL INSTRUMENTATION;  Surgeon: Carmela Hurt, MD;  Location: [facility];  Service: Neurosurgery;  Laterality: N/A;  Lumbar 5 GILL procedure, POSTERIOR LATERAL ARTHRODESIS POSTERIOR NONSEGMENTAL INSTRUMENTATION  . REPLACEMENT TOTAL KNEE BILATERAL   2007    [facility], Dr. Romeo Apple  . STERIOD INJECTION   07/08/2011    Procedure: STEROID INJECTION;  Surgeon: Marlowe Shores, MD;  Location: [facility];  Service: Orthopedics;  Laterality: Left;  injection 1ml celestone left index finger  . TUBAL LIGATION   1989  . WRIST ARTHROSCOPY   07/08/2011    Procedure: ARTHROSCOPY WRIST;  Surgeon: Marlowe Shores, MD;  Location: [facility];  Service: Orthopedics;  Laterality: Right;  right wrist with ganglionectomy,         Past Medical History:  Diagnosis Date  . Anxiety    . Arthritis    .  Asthma      related to bronchitis-no recent issues  . Depression    . Diabetes mellitus    . GERD (gastroesophageal reflux disease)    . Headache(784.0)    . Hypercholesterolemia    . Hypertension    . Peripheral neuropathy    . PONV (postoperative nausea and vomiting)    . Psoriasis      Social History         Tobacco Use  . Smoking status: Current Every Day Smoker      Packs/day: 0.50      Years: 45.00      Pack years: 22.50      Types: Cigarettes  . Smokeless tobacco: Never Used  Substance Use Topics  . Alcohol use: No  . Drug use: No             Allergies  Allergen Reactions  . Codeine Other (See Comments)      REACTION: hallucinations  . Levofloxacin Nausea And Vomiting  . Adhesive [Tape] Itching and Rash      BP 110/70   Pulse 65   Ht  (1.626 m)   Wt 216 lb (98  kg)   BMI 37.08 kg/m      Physical Exam  Constitutional: She is oriented to person, place, and time. She appears well-developed and well-nourished.  Neurological: She is alert and oriented to person, place, and time.  Psychiatric: She has a normal mood and affect. Judgment normal.  Vitals reviewed.     Ortho Exam   BP 110/70   Pulse 65   Ht  (1.626 m)   Wt 216 lb (98 kg)   BMI 37.08 kg/m      Ortho Exam Right and left wrist examination including forearms   Tenderness at the base of the thumb bilaterally with negative Finkelstein's test   First extensor compartment nontender   Overall the hands look fine without any swelling no malalignment.  Full passive and active range of motion with equal grip strength.  No instability is detected at the wrist there is pain over both carpal tunnels with positive compression test at 15 and 20 seconds respectively left to right.  Decreased sensation in the median nerve distribution in each wrist   Color and capillary refill normal bilaterally.  There is no epitrochlear lymph node involvement on either side   Skin warm dry intact bilaterally         Encounter Diagnosis  Name Primary?  .  Left carpal tunnel syndrome Yes      A nerve conduction study report has been done by [facility] orthopedic physician Dr. Ronne Binning   This indicates abnormal electrodiagnostic study mild to moderate left median nerve entrapment at the wrists affecting sensory and motor components the motor nerve showed decreased conduction velocity elbow to wrist 48 m/s the left median across the palm sensory nerve showed prolonged distal peak latency 3.8 ms       PLAN: After discussion with the patient she does not want to continue with nonoperative treatment including splinting so we will proceed with an open left carpal tunnel release   The procedure has been fully reviewed with the patient; The risks and benefits of surgery have been discussed and  explained and understood. Alternative treatment has also been reviewed, questions were encouraged and answered. The postoperative plan is also been reviewed.

## 2017-11-30 ENCOUNTER — Ambulatory Visit (HOSPITAL_COMMUNITY): Payer: 59 | Admitting: Anesthesiology

## 2017-11-30 ENCOUNTER — Encounter (HOSPITAL_COMMUNITY): Payer: Self-pay | Admitting: *Deleted

## 2017-11-30 ENCOUNTER — Ambulatory Visit (HOSPITAL_COMMUNITY)
Admission: RE | Admit: 2017-11-30 | Discharge: 2017-11-30 | Disposition: A | Discharge status: Home / Self Care | Payer: 59 | Source: Ambulatory Visit | Attending: Orthopedic Surgery | Admitting: Orthopedic Surgery

## 2017-11-30 ENCOUNTER — Encounter (HOSPITAL_COMMUNITY)
Admission: RE | Disposition: A | Discharge status: Home / Self Care | Payer: Self-pay | Source: Ambulatory Visit | Attending: Orthopedic Surgery

## 2017-11-30 DIAGNOSIS — Z885 Allergy status to narcotic agent status: Secondary | ICD-10-CM | POA: Diagnosis not present

## 2017-11-30 DIAGNOSIS — Z888 Allergy status to other drugs, medicaments and biological substances status: Secondary | ICD-10-CM | POA: Diagnosis not present

## 2017-11-30 DIAGNOSIS — E1140 Type 2 diabetes mellitus with diabetic neuropathy, unspecified: Secondary | ICD-10-CM | POA: Insufficient documentation

## 2017-11-30 DIAGNOSIS — I10 Essential (primary) hypertension: Secondary | ICD-10-CM | POA: Insufficient documentation

## 2017-11-30 DIAGNOSIS — F17210 Nicotine dependence, cigarettes, uncomplicated: Secondary | ICD-10-CM | POA: Diagnosis not present

## 2017-11-30 DIAGNOSIS — J45909 Unspecified asthma, uncomplicated: Secondary | ICD-10-CM | POA: Insufficient documentation

## 2017-11-30 DIAGNOSIS — Z9889 Other specified postprocedural states: Secondary | ICD-10-CM

## 2017-11-30 DIAGNOSIS — Z96653 Presence of artificial knee joint, bilateral: Secondary | ICD-10-CM | POA: Insufficient documentation

## 2017-11-30 DIAGNOSIS — G5602 Carpal tunnel syndrome, left upper limb: Secondary | ICD-10-CM | POA: Insufficient documentation

## 2017-11-30 HISTORY — PX: CARPAL TUNNEL RELEASE: SHX101

## 2017-11-30 LAB — GLUCOSE, CAPILLARY
GLUCOSE-CAPILLARY: 107 mg/dL — AB (ref 65–99)
Glucose-Capillary: 126 mg/dL — ABNORMAL HIGH (ref 65–99)

## 2017-11-30 SURGERY — CARPAL TUNNEL RELEASE
Anesthesia: Monitor Anesthesia Care | Site: Wrist | Laterality: Left

## 2017-11-30 MED ORDER — CHLORHEXIDINE GLUCONATE 4 % EX LIQD: 60.0000 mL | Freq: Once | CUTANEOUS | Status: DC

## 2017-11-30 MED ORDER — BUPIVACAINE HCL (PF) 0.5 % IJ SOLN
INTRAMUSCULAR | Status: DC | PRN
  Administered 2017-11-30: 10 mL

## 2017-11-30 MED ORDER — LIDOCAINE HCL (PF) 0.5 % IJ SOLN
INTRAMUSCULAR | Status: DC | PRN
  Administered 2017-11-30: 50 mL via INTRAVENOUS

## 2017-11-30 MED ORDER — LACTATED RINGERS IV SOLN
INTRAVENOUS | Status: DC
  Administered 2017-11-30: 1000 mL via INTRAVENOUS

## 2017-11-30 MED ORDER — MIDAZOLAM HCL 5 MG/5ML IJ SOLN
INTRAMUSCULAR | Status: DC | PRN
  Administered 2017-11-30: 2 mg via INTRAVENOUS

## 2017-11-30 MED ORDER — SODIUM CHLORIDE 0.9% FLUSH
INTRAVENOUS | Status: AC
  Filled 2017-11-30: qty 10

## 2017-11-30 MED ORDER — FENTANYL CITRATE (PF) 100 MCG/2ML IJ SOLN: 25.0000 ug | INTRAMUSCULAR | Status: DC | PRN

## 2017-11-30 MED ORDER — 0.9 % SODIUM CHLORIDE (POUR BTL) OPTIME
TOPICAL | Status: DC | PRN
  Administered 2017-11-30: 1000 mL

## 2017-11-30 MED ORDER — MIDAZOLAM HCL 2 MG/2ML IJ SOLN
INTRAMUSCULAR | Status: AC
  Filled 2017-11-30: qty 2

## 2017-11-30 MED ORDER — ONDANSETRON HCL 4 MG/2ML IJ SOLN
4.0000 mg | Freq: Once | INTRAMUSCULAR | Status: AC
  Administered 2017-11-30: 4 mg via INTRAVENOUS
  Filled 2017-11-30: qty 2

## 2017-11-30 MED ORDER — BUPIVACAINE HCL (PF) 0.5 % IJ SOLN
INTRAMUSCULAR | Status: AC
  Filled 2017-11-30: qty 30

## 2017-11-30 MED ORDER — FENTANYL CITRATE (PF) 100 MCG/2ML IJ SOLN
INTRAMUSCULAR | Status: AC
  Filled 2017-11-30: qty 2

## 2017-11-30 MED ORDER — LIDOCAINE HCL (PF) 1 % IJ SOLN
INTRAMUSCULAR | Status: AC
Start: 2017-11-30 — End: ?
  Filled 2017-11-30: qty 5

## 2017-11-30 MED ORDER — HYDROCODONE-ACETAMINOPHEN 5-325 MG PO TABS: 1.0000 | ORAL_TABLET | ORAL | 0 refills | Status: DC | PRN

## 2017-11-30 MED ORDER — FENTANYL CITRATE (PF) 100 MCG/2ML IJ SOLN
INTRAMUSCULAR | Status: DC | PRN
  Administered 2017-11-30: 25 ug via INTRAVENOUS

## 2017-11-30 MED ORDER — HYDROCODONE-ACETAMINOPHEN 5-325 MG PO TABS
1.0000 | ORAL_TABLET | ORAL | Status: DC | PRN
  Administered 2017-11-30: 1 via ORAL
  Filled 2017-11-30: qty 1

## 2017-11-30 MED ORDER — CEFAZOLIN SODIUM-DEXTROSE 2-4 GM/100ML-% IV SOLN
2.0000 g | INTRAVENOUS | Status: AC
  Administered 2017-11-30: 2 g via INTRAVENOUS

## 2017-11-30 MED ORDER — PROPOFOL 10 MG/ML IV BOLUS
INTRAVENOUS | Status: AC
  Filled 2017-11-30: qty 20

## 2017-11-30 MED ORDER — PROPOFOL 10 MG/ML IV BOLUS
INTRAVENOUS | Status: DC | PRN
  Administered 2017-11-30: 10 mg via INTRAVENOUS

## 2017-11-30 MED ORDER — LIDOCAINE HCL (PF) 0.5 % IJ SOLN
INTRAMUSCULAR | Status: AC
  Filled 2017-11-30: qty 50

## 2017-11-30 MED ORDER — CEFAZOLIN SODIUM-DEXTROSE 2-4 GM/100ML-% IV SOLN
INTRAVENOUS | Status: AC
  Filled 2017-11-30: qty 100

## 2017-11-30 MED ORDER — PROPOFOL 500 MG/50ML IV EMUL
INTRAVENOUS | Status: DC | PRN
  Administered 2017-11-30: 100 ug/kg/min via INTRAVENOUS

## 2017-11-30 SURGICAL SUPPLY — 41 items
BANDAGE ELASTIC 3 LF NS (GAUZE/BANDAGES/DRESSINGS) ×2 IMPLANT
BANDAGE ESMARK 4X12 BL STRL LF (DISPOSABLE) ×1 IMPLANT
BLADE SURG 15 STRL LF DISP TIS (BLADE) ×1 IMPLANT
BLADE SURG 15 STRL SS (BLADE) ×2
BNDG CMPR 12X4 ELC STRL LF (DISPOSABLE) ×1
BNDG CMPR MED 5X3 ELC HKLP NS (GAUZE/BANDAGES/DRESSINGS) ×1
BNDG COHESIVE 4X5 TAN STRL (GAUZE/BANDAGES/DRESSINGS) ×2 IMPLANT
BNDG ESMARK 4X12 BLUE STRL LF (DISPOSABLE) ×2
BNDG GAUZE ELAST 4 BULKY (GAUZE/BANDAGES/DRESSINGS) ×2 IMPLANT
CHLORAPREP W/TINT 26ML (MISCELLANEOUS) ×2 IMPLANT
CLOTH BEACON ORANGE TIMEOUT ST (SAFETY) ×2 IMPLANT
COVER LIGHT HANDLE STERIS (MISCELLANEOUS) ×4 IMPLANT
CUFF TOURNIQUET SINGLE 18IN (TOURNIQUET CUFF) ×2 IMPLANT
DECANTER SPIKE VIAL GLASS SM (MISCELLANEOUS) ×2 IMPLANT
DRAPE PROXIMA HALF (DRAPES) ×2 IMPLANT
DRSG XEROFORM 1X8 (GAUZE/BANDAGES/DRESSINGS) ×2 IMPLANT
ELECT NDL TIP 2.8 STRL (NEEDLE) ×1 IMPLANT
ELECT NEEDLE TIP 2.8 STRL (NEEDLE) ×2 IMPLANT
ELECT REM PT RETURN 9FT ADLT (ELECTROSURGICAL) ×2
ELECTRODE REM PT RTRN 9FT ADLT (ELECTROSURGICAL) ×1 IMPLANT
GAUZE SPONGE 4X4 12PLY STRL (GAUZE/BANDAGES/DRESSINGS) ×2 IMPLANT
GLOVE BIOGEL PI IND STRL 7.0 (GLOVE) ×2 IMPLANT
GLOVE BIOGEL PI INDICATOR 7.0 (GLOVE) ×2
GLOVE ECLIPSE 6.5 STRL STRAW (GLOVE) ×1 IMPLANT
GLOVE SKINSENSE NS SZ8.0 LF (GLOVE) ×1
GLOVE SKINSENSE STRL SZ8.0 LF (GLOVE) ×1 IMPLANT
GLOVE SS N UNI LF 8.5 STRL (GLOVE) ×2 IMPLANT
GOWN STRL REUS W/ TWL LRG LVL3 (GOWN DISPOSABLE) ×1 IMPLANT
GOWN STRL REUS W/TWL LRG LVL3 (GOWN DISPOSABLE) ×2
GOWN STRL REUS W/TWL XL LVL3 (GOWN DISPOSABLE) ×2 IMPLANT
HAND ALUMI XLG (SOFTGOODS) ×2 IMPLANT
KIT TURNOVER KIT A (KITS) ×2 IMPLANT
MANIFOLD NEPTUNE II (INSTRUMENTS) ×2 IMPLANT
NDL HYPO 21X1.5 SAFETY (NEEDLE) ×1 IMPLANT
NEEDLE HYPO 21X1.5 SAFETY (NEEDLE) ×2 IMPLANT
NS IRRIG 1000ML POUR BTL (IV SOLUTION) ×2 IMPLANT
PACK BASIC LIMB (CUSTOM PROCEDURE TRAY) ×2 IMPLANT
PAD ARMBOARD 7.5X6 YLW CONV (MISCELLANEOUS) ×2 IMPLANT
SET BASIN LINEN APH (SET/KITS/TRAYS/PACK) ×2 IMPLANT
SUT ETHILON 3 0 FSL (SUTURE) ×2 IMPLANT
SYR CONTROL 10ML LL (SYRINGE) ×2 IMPLANT

## 2017-11-30 NOTE — Interval H&P Note (Signed)
History and Physical Interval Note:  11/30/2017 7:22 AM  Dawn Kirk  has presented today for surgery, with the diagnosis of Left carpal tunnel syndrome  The various methods of treatment have been discussed with the patient and family. After consideration of risks, benefits and other options for treatment, the patient has consented to  Procedure(s): CARPAL TUNNEL RELEASE (Left) as a surgical intervention .  The patient's history has been reviewed, patient examined, no change in status, stable for surgery.  I have reviewed the patient's chart and labs.  Questions were answered to the patient's satisfaction.     Fuller Canada

## 2017-11-30 NOTE — Anesthesia Postprocedure Evaluation (Signed)
Anesthesia Post Note  Patient: TALLIA MOEHRING  Procedure(s) Performed: CARPAL TUNNEL RELEASE (Left Wrist)  Patient location during evaluation: PACU Anesthesia Type: MAC Level of consciousness: awake and alert and oriented Pain management: pain level controlled Vital Signs Assessment: post-procedure vital signs reviewed and stable Respiratory status: spontaneous breathing Cardiovascular status: blood pressure returned to baseline Postop Assessment: no apparent nausea or vomiting Anesthetic complications: no     Last Vitals:  Vitals:   11/30/17 0830 11/30/17 0842  BP: 119/72 (!) 147/89  Pulse: 66 65  Resp: 14 14  Temp:  36.6 C  SpO2: 98% 95%    Last Pain:  Vitals:   11/30/17 0842  TempSrc: Oral  PainSc: 0-No pain                 Zoeie Ritter

## 2017-11-30 NOTE — Brief Op Note (Signed)
11/30/2017  8:07 AM  PATIENT:  Dawn Kirk  62 y.o. female  PRE-OPERATIVE DIAGNOSIS:  Left carpal tunnel syndrome  POST-OPERATIVE DIAGNOSIS:  Left carpal tunnel syndrome  PROCEDURE:  Procedure(s): CARPAL TUNNEL RELEASE (Left) - 64721   Operative findings compression of the left median nerve without any anterior carpal tunnel masses   Indications failure of conservative treatment to relieve pain and paresthesias and numbness and tingling of the left hand  The patient was identified in the preop area we confirm the surgical site marked as left wrist. Chart update completed. Patient taken to surgery. She had 2 g of Ancef. After establishing a Bier block left her arm was prepped with ChloraPrep.  Timeout executed completed and confirmed site.  A straight incision was made over the left carpal tunnel in line with the radial border of the ring finger. Blunt dissection was carried out to find the distal aspect of the carpal tunnel. A blunted instrument was passed beneath the carpal tunnel. Sharp incision was then used to release the transverse carpal ligament. The contents of the carpal tunnel were inspected. The median nerve was compressed with slight discoloration.  The wound was irrigated and then closed with 3-0 nylon suture. We injected 10 mL of plain Marcaine on the radial side of the incision  A sterile bandage was applied and the tourniquet was released the color of the hand and capillary refill were normal  The patient was taken to the recovery room in stable condition  SURGEON:  Surgeon(s) and Role:    * Vickki Hearing, MD - Primary  PHYSICIAN ASSISTANT:   ASSISTANTS: none   ANESTHESIA:   regional  EBL:  0 mL   BLOOD ADMINISTERED:none  DRAINS: none   LOCAL MEDICATIONS USED:  MARCAINE     SPECIMEN:  No Specimen  DISPOSITION OF SPECIMEN:  N/A  COUNTS:  YES  TOURNIQUET:   Total Tourniquet Time Documented: Upper Arm (Left) - 25 minutes Total: Upper Arm  (Left) - 25 minutes   DICTATION: .Reubin Milan Dictation  PLAN OF CARE: Discharge to home after PACU  PATIENT DISPOSITION:  PACU - hemodynamically stable.   Delay start of Pharmacological VTE agent (>24hrs) due to surgical blood loss or risk of bleeding: not applicable

## 2017-11-30 NOTE — Anesthesia Preprocedure Evaluation (Signed)
Anesthesia Evaluation  Patient identified by MRN, date of birth, ID band Patient awake    Reviewed: Allergy & Precautions, NPO status , Patient's Chart, lab work & pertinent test results  History of Anesthesia Complications (+) PONV and history of anesthetic complications  Airway Mallampati: II  TM Distance: >3 FB Neck ROM: Full    Dental no notable dental hx. (+) Teeth Intact   Pulmonary neg pulmonary ROS, asthma , Current Smoker,    Pulmonary exam normal breath sounds clear to auscultation       Cardiovascular Exercise Tolerance: Poor hypertension, Pt. on medications negative cardio ROS Normal cardiovascular examI Rhythm:Regular Rate:Normal  Limited by LBP/B knees  States no recent CP or DOE   Neuro/Psych  Headaches, PSYCHIATRIC DISORDERS Anxiety Depression  Neuromuscular disease negative neurological ROS  negative psych ROS   GI/Hepatic negative GI ROS, Neg liver ROS, GERD  ,Denies Sx today   Endo/Other  negative endocrine ROSdiabetes, Well Controlled, Type 2glc -107 today  Renal/GU negative Renal ROS  negative genitourinary   Musculoskeletal negative musculoskeletal ROS (+) Arthritis , Osteoarthritis,    Abdominal   Peds negative pediatric ROS (+)  Hematology negative hematology ROS (+)   Anesthesia Other Findings   Reproductive/Obstetrics negative OB ROS                             Anesthesia Physical Anesthesia Plan  ASA: II  Anesthesia Plan: Bier Block and MAC and Bier Block-LIDOCAINE ONLY   Post-op Pain Management:    Induction: Intravenous  PONV Risk Score and Plan:   Airway Management Planned: Nasal Cannula and Simple Face Mask  Additional Equipment:   Intra-op Plan:   Post-operative Plan: Extubation in OR  Informed Consent: I have reviewed the patients History and Physical, chart, labs and discussed the procedure including the risks, benefits and alternatives  for the proposed anesthesia with the patient or authorized representative who has indicated his/her understanding and acceptance.   Dental advisory given  Plan Discussed with: CRNA  Anesthesia Plan Comments:         Anesthesia Quick Evaluation

## 2017-11-30 NOTE — Op Note (Signed)
11/30/2017  8:07 AM  PATIENT:  Dawn Kirk  62 y.o. female  PRE-OPERATIVE DIAGNOSIS:  Left carpal tunnel syndrome  POST-OPERATIVE DIAGNOSIS:  Left carpal tunnel syndrome  PROCEDURE:  Procedure(s): CARPAL TUNNEL RELEASE (Left) - 64721   Operative findings compression of the left median nerve without any anterior carpal tunnel masses   Indications failure of conservative treatment to relieve pain and paresthesias and numbness and tingling of the left hand  The patient was identified in the preop area we confirm the surgical site marked as left wrist. Chart update completed. Patient taken to surgery. She had 2 g of Ancef. After establishing a Bier block left her arm was prepped with ChloraPrep.  Timeout executed completed and confirmed site.  A straight incision was made over the left carpal tunnel in line with the radial border of the ring finger. Blunt dissection was carried out to find the distal aspect of the carpal tunnel. A blunted instrument was passed beneath the carpal tunnel. Sharp incision was then used to release the transverse carpal ligament. The contents of the carpal tunnel were inspected. The median nerve was compressed with slight discoloration.  The wound was irrigated and then closed with 3-0 nylon suture. We injected 10 mL of plain Marcaine on the radial side of the incision  A sterile bandage was applied and the tourniquet was released the color of the hand and capillary refill were normal  The patient was taken to the recovery room in stable condition  SURGEON:  Surgeon(s) and Role:    * Alaena Strader E, MD - Primary  PHYSICIAN ASSISTANT:   ASSISTANTS: none   ANESTHESIA:   regional  EBL:  0 mL   BLOOD ADMINISTERED:none  DRAINS: none   LOCAL MEDICATIONS USED:  MARCAINE     SPECIMEN:  No Specimen  DISPOSITION OF SPECIMEN:  N/A  COUNTS:  YES  TOURNIQUET:   Total Tourniquet Time Documented: Upper Arm (Left) - 25 minutes Total: Upper Arm  (Left) - 25 minutes   DICTATION: .Dragon Dictation  PLAN OF CARE: Discharge to home after PACU  PATIENT DISPOSITION:  PACU - hemodynamically stable.   Delay start of Pharmacological VTE agent (>24hrs) due to surgical blood loss or risk of bleeding: not applicable  

## 2017-11-30 NOTE — Anesthesia Procedure Notes (Signed)
Anesthesia Regional Block: Bier block (IV Regional)   Pre-Anesthetic Checklist: ,, timeout performed, Correct Patient, Correct Site, Correct Laterality, Correct Procedure,, site marked, surgical consent,, at surgeon's request  Laterality: Left     Needles:  Injection technique: Single-shot  Needle Type: Other      Needle Gauge: 22     Additional Needles:   Procedures:,,,,, intact distal pulses, Esmarch exsanguination, single tourniquet utilized,   Nerve Stimulator or Paresthesia:   Additional Responses:  Pulse checked post tourniquet inflation. IV NSL discontinued post injection. Narrative:   Performed by: Personally       

## 2017-11-30 NOTE — Transfer of Care (Signed)
Immediate Anesthesia Transfer of Care Note  Patient: Dawn Kirk  Procedure(s) Performed: CARPAL TUNNEL RELEASE (Left Wrist)  Patient Location: PACU  Anesthesia Type:Bier block  Level of Consciousness: awake, alert  and oriented  Airway & Oxygen Therapy: Patient Spontanous Breathing  Post-op Assessment: Report given to RN  Post vital signs: Reviewed and stable  Last Vitals:  Vitals Value Taken Time  BP 116/63 11/30/2017  8:08 AM  Temp    Pulse 66 11/30/2017  8:11 AM  Resp 18 11/30/2017  8:11 AM  SpO2 98 % 11/30/2017  8:11 AM  Vitals shown include unvalidated device data.  Last Pain:  Vitals:   11/30/17 0652  TempSrc: Oral  PainSc: 0-No pain      Patients Stated Pain Goal: 7 (11/30/17 2130)  Complications: No apparent anesthesia complications

## 2017-12-01 ENCOUNTER — Encounter (HOSPITAL_COMMUNITY): Payer: Self-pay | Admitting: Orthopedic Surgery

## 2017-12-04 DIAGNOSIS — N182 Chronic kidney disease, stage 2 (mild): Secondary | ICD-10-CM | POA: Diagnosis not present

## 2017-12-04 DIAGNOSIS — L409 Psoriasis, unspecified: Secondary | ICD-10-CM | POA: Diagnosis not present

## 2017-12-04 DIAGNOSIS — E782 Mixed hyperlipidemia: Secondary | ICD-10-CM | POA: Diagnosis not present

## 2017-12-04 DIAGNOSIS — Z720 Tobacco use: Secondary | ICD-10-CM | POA: Diagnosis not present

## 2017-12-04 DIAGNOSIS — E1140 Type 2 diabetes mellitus with diabetic neuropathy, unspecified: Secondary | ICD-10-CM | POA: Diagnosis not present

## 2017-12-04 DIAGNOSIS — F411 Generalized anxiety disorder: Secondary | ICD-10-CM | POA: Diagnosis not present

## 2017-12-04 DIAGNOSIS — M545 Low back pain: Secondary | ICD-10-CM | POA: Diagnosis not present

## 2017-12-04 DIAGNOSIS — F339 Major depressive disorder, recurrent, unspecified: Secondary | ICD-10-CM | POA: Diagnosis not present

## 2017-12-04 DIAGNOSIS — F5101 Primary insomnia: Secondary | ICD-10-CM | POA: Diagnosis not present

## 2017-12-19 ENCOUNTER — Encounter: Payer: Self-pay | Admitting: Orthopedic Surgery

## 2017-12-19 ENCOUNTER — Ambulatory Visit (INDEPENDENT_AMBULATORY_CARE_PROVIDER_SITE_OTHER): Payer: 59 | Admitting: Orthopedic Surgery

## 2017-12-19 VITALS — BP 120/73 | HR 77 | Ht 64.0 in | Wt 216.0 lb

## 2017-12-19 DIAGNOSIS — Z9889 Other specified postprocedural states: Secondary | ICD-10-CM

## 2017-12-19 NOTE — Progress Notes (Signed)
POSTOP VISIT  POD # 19  Chief Complaint  Patient presents with  . Post-op Follow-up    left carpal tunnel release 11/30/17    HPI  Encounter Diagnosis  Name Primary?  . S/P carpal tunnel release left 11/30/17 Yes    Stable clean wound  Sore thumb not unexpected numbness and tingling resolved wound clean  Postoperative plan (Work, WB, No orders of the defined types were placed in this encounter. ,FU)  Return to work on the 10th  Follow-up as needed

## 2017-12-21 ENCOUNTER — Telehealth: Payer: Self-pay | Admitting: Orthopedic Surgery

## 2017-12-21 MED FILL — ROSUVASTATIN CALCIUM 5 MG T: 5 | 90 days supply | Qty: 90 | Fill #1

## 2017-12-21 NOTE — Telephone Encounter (Signed)
Patient returned call to office. I relayed message about duty form. She stated she would come by the office one day next week and pick up the form so she can give it directly to her employer.

## 2017-12-21 NOTE — Telephone Encounter (Signed)
Called patient to relay that fitness for duty form has been signed for her return to work. Left message (does patient need to give directly to employer or may we fax to [employer]?

## 2017-12-27 DIAGNOSIS — G9009 Other idiopathic peripheral autonomic neuropathy: Secondary | ICD-10-CM | POA: Diagnosis not present

## 2017-12-27 DIAGNOSIS — F5101 Primary insomnia: Secondary | ICD-10-CM | POA: Diagnosis not present

## 2017-12-27 DIAGNOSIS — M545 Low back pain: Secondary | ICD-10-CM | POA: Diagnosis not present

## 2017-12-27 DIAGNOSIS — Z6837 Body mass index (BMI) 37.0-37.9, adult: Secondary | ICD-10-CM | POA: Diagnosis not present

## 2017-12-28 ENCOUNTER — Other Ambulatory Visit (HOSPITAL_COMMUNITY): Payer: Self-pay | Admitting: Internal Medicine

## 2017-12-28 DIAGNOSIS — M5418 Radiculopathy, sacral and sacrococcygeal region: Secondary | ICD-10-CM

## 2017-12-28 DIAGNOSIS — M5416 Radiculopathy, lumbar region: Secondary | ICD-10-CM

## 2018-01-04 ENCOUNTER — Ambulatory Visit (HOSPITAL_COMMUNITY)
Admission: RE | Admit: 2018-01-04 | Discharge: 2018-01-04 | Disposition: A | Discharge status: Home / Self Care | Payer: 59 | Source: Ambulatory Visit | Attending: Internal Medicine | Admitting: Internal Medicine

## 2018-01-04 DIAGNOSIS — M5137 Other intervertebral disc degeneration, lumbosacral region: Secondary | ICD-10-CM | POA: Insufficient documentation

## 2018-01-04 DIAGNOSIS — M5418 Radiculopathy, sacral and sacrococcygeal region: Secondary | ICD-10-CM

## 2018-01-04 DIAGNOSIS — M5126 Other intervertebral disc displacement, lumbar region: Secondary | ICD-10-CM | POA: Diagnosis not present

## 2018-01-04 DIAGNOSIS — M4726 Other spondylosis with radiculopathy, lumbar region: Secondary | ICD-10-CM | POA: Diagnosis not present

## 2018-01-04 DIAGNOSIS — M5416 Radiculopathy, lumbar region: Secondary | ICD-10-CM

## 2018-01-04 DIAGNOSIS — M47816 Spondylosis without myelopathy or radiculopathy, lumbar region: Secondary | ICD-10-CM | POA: Diagnosis not present

## 2018-01-04 MED ORDER — GADOBENATE DIMEGLUMINE 529 MG/ML IV SOLN
20.0000 mL | Freq: Once | INTRAVENOUS | Status: AC | PRN
  Administered 2018-01-04: 20 mL via INTRAVENOUS

## 2018-01-05 DIAGNOSIS — M79674 Pain in right toe(s): Secondary | ICD-10-CM | POA: Diagnosis not present

## 2018-01-05 DIAGNOSIS — L851 Acquired keratosis [keratoderma] palmaris et plantaris: Secondary | ICD-10-CM | POA: Diagnosis not present

## 2018-01-05 DIAGNOSIS — L600 Ingrowing nail: Secondary | ICD-10-CM | POA: Diagnosis not present

## 2018-01-11 MED FILL — JANUMET XR 100-1,000 MG TAB: 100-1000 | 90 days supply | Qty: 90 | Fill #1

## 2018-01-11 MED FILL — traZODone HCL 50 MG TABS: 50 | 90 days supply | Qty: 90 | Fill #0

## 2018-01-11 MED FILL — DEXILANT DR 60 MG CAPSULE: 60 | 30 days supply | Qty: 30 | Fill #3

## 2018-01-17 DIAGNOSIS — G4700 Insomnia, unspecified: Secondary | ICD-10-CM | POA: Diagnosis not present

## 2018-01-17 DIAGNOSIS — G9009 Other idiopathic peripheral autonomic neuropathy: Secondary | ICD-10-CM | POA: Diagnosis not present

## 2018-01-17 DIAGNOSIS — M479 Spondylosis, unspecified: Secondary | ICD-10-CM | POA: Diagnosis not present

## 2018-01-17 DIAGNOSIS — Z6836 Body mass index (BMI) 36.0-36.9, adult: Secondary | ICD-10-CM | POA: Diagnosis not present

## 2018-01-19 DIAGNOSIS — M79674 Pain in right toe(s): Secondary | ICD-10-CM | POA: Diagnosis not present

## 2018-01-19 DIAGNOSIS — L600 Ingrowing nail: Secondary | ICD-10-CM | POA: Diagnosis not present

## 2018-01-24 MED FILL — TRINTELLIX 20 MG TABLET: 20 | 90 days supply | Qty: 90 | Fill #0

## 2018-01-30 DIAGNOSIS — M5416 Radiculopathy, lumbar region: Secondary | ICD-10-CM | POA: Diagnosis not present

## 2018-01-30 DIAGNOSIS — Z6836 Body mass index (BMI) 36.0-36.9, adult: Secondary | ICD-10-CM | POA: Diagnosis not present

## 2018-02-02 DIAGNOSIS — H5203 Hypermetropia, bilateral: Secondary | ICD-10-CM | POA: Diagnosis not present

## 2018-02-02 DIAGNOSIS — H52223 Regular astigmatism, bilateral: Secondary | ICD-10-CM | POA: Diagnosis not present

## 2018-02-02 DIAGNOSIS — H43812 Vitreous degeneration, left eye: Secondary | ICD-10-CM | POA: Diagnosis not present

## 2018-02-02 DIAGNOSIS — H524 Presbyopia: Secondary | ICD-10-CM | POA: Diagnosis not present

## 2018-02-13 DIAGNOSIS — G9009 Other idiopathic peripheral autonomic neuropathy: Secondary | ICD-10-CM | POA: Diagnosis not present

## 2018-02-13 DIAGNOSIS — Z6837 Body mass index (BMI) 37.0-37.9, adult: Secondary | ICD-10-CM | POA: Diagnosis not present

## 2018-02-13 DIAGNOSIS — F5101 Primary insomnia: Secondary | ICD-10-CM | POA: Diagnosis not present

## 2018-02-13 DIAGNOSIS — G589 Mononeuropathy, unspecified: Secondary | ICD-10-CM | POA: Diagnosis not present

## 2018-02-13 DIAGNOSIS — M4716 Other spondylosis with myelopathy, lumbar region: Secondary | ICD-10-CM | POA: Diagnosis not present

## 2018-02-13 DIAGNOSIS — G5603 Carpal tunnel syndrome, bilateral upper limbs: Secondary | ICD-10-CM | POA: Diagnosis not present

## 2018-02-13 MED FILL — TRIAMTERENE/HCTZ 37.5/25 TB: 37.5-25 | 90 days supply | Qty: 90 | Fill #0

## 2018-02-13 MED FILL — DEXILANT DR 60 MG CAPSULE: 60 | 90 days supply | Qty: 90 | Fill #0

## 2018-02-15 MED FILL — TRIAMTERENE/HCTZ 37.5/25 CP: 37.5-25 | 90 days supply | Qty: 90 | Fill #2

## 2018-03-29 MED FILL — ROSUVASTATIN CALCIUM 5 MG T: 5 | 90 days supply | Qty: 90 | Fill #2

## 2018-04-09 DIAGNOSIS — E1141 Type 2 diabetes mellitus with diabetic mononeuropathy: Secondary | ICD-10-CM | POA: Diagnosis not present

## 2018-04-09 DIAGNOSIS — G589 Mononeuropathy, unspecified: Secondary | ICD-10-CM | POA: Diagnosis not present

## 2018-04-09 DIAGNOSIS — N182 Chronic kidney disease, stage 2 (mild): Secondary | ICD-10-CM | POA: Diagnosis not present

## 2018-04-09 DIAGNOSIS — F411 Generalized anxiety disorder: Secondary | ICD-10-CM | POA: Diagnosis not present

## 2018-04-09 DIAGNOSIS — F339 Major depressive disorder, recurrent, unspecified: Secondary | ICD-10-CM | POA: Diagnosis not present

## 2018-04-09 DIAGNOSIS — J441 Chronic obstructive pulmonary disease with (acute) exacerbation: Secondary | ICD-10-CM | POA: Diagnosis not present

## 2018-04-09 DIAGNOSIS — L409 Psoriasis, unspecified: Secondary | ICD-10-CM | POA: Diagnosis not present

## 2018-04-09 DIAGNOSIS — E782 Mixed hyperlipidemia: Secondary | ICD-10-CM | POA: Diagnosis not present

## 2018-04-09 DIAGNOSIS — Z720 Tobacco use: Secondary | ICD-10-CM | POA: Diagnosis not present

## 2018-04-13 DIAGNOSIS — E782 Mixed hyperlipidemia: Secondary | ICD-10-CM | POA: Diagnosis not present

## 2018-04-13 DIAGNOSIS — N182 Chronic kidney disease, stage 2 (mild): Secondary | ICD-10-CM | POA: Diagnosis not present

## 2018-04-13 DIAGNOSIS — E1141 Type 2 diabetes mellitus with diabetic mononeuropathy: Secondary | ICD-10-CM | POA: Diagnosis not present

## 2018-04-13 DIAGNOSIS — F339 Major depressive disorder, recurrent, unspecified: Secondary | ICD-10-CM | POA: Diagnosis not present

## 2018-04-13 DIAGNOSIS — J441 Chronic obstructive pulmonary disease with (acute) exacerbation: Secondary | ICD-10-CM | POA: Diagnosis not present

## 2018-04-13 DIAGNOSIS — Z720 Tobacco use: Secondary | ICD-10-CM | POA: Diagnosis not present

## 2018-04-13 DIAGNOSIS — G589 Mononeuropathy, unspecified: Secondary | ICD-10-CM | POA: Diagnosis not present

## 2018-04-13 DIAGNOSIS — Z23 Encounter for immunization: Secondary | ICD-10-CM | POA: Diagnosis not present

## 2018-04-13 DIAGNOSIS — F411 Generalized anxiety disorder: Secondary | ICD-10-CM | POA: Diagnosis not present

## 2018-04-13 MED FILL — traZODone HCL 100 MG TABS: 100 | 90 days supply | Qty: 90 | Fill #0

## 2018-04-17 MED FILL — JANUMET XR 100-1,000 MG TAB: 100-1000 | 60 days supply | Qty: 60 | Fill #2

## 2018-04-17 MED FILL — TRINTELLIX 20 MG TABLET: 20 | 90 days supply | Qty: 90 | Fill #1

## 2018-05-08 ENCOUNTER — Encounter: Payer: Self-pay | Admitting: Gastroenterology

## 2018-05-21 MED FILL — DEXILANT DR 60 MG CAPSULE: 60 | 90 days supply | Qty: 90 | Fill #1

## 2018-06-27 MED FILL — TRIAMTERENE/HCTZ 37.5/25 TB: 37.5-25 | 90 days supply | Qty: 90 | Fill #1

## 2018-06-27 MED FILL — ROSUVASTATIN CALCIUM 5 MG T: 5 | 90 days supply | Qty: 90 | Fill #0

## 2018-07-16 MED FILL — traZODone HCL 100 MG TABS: 100 | 90 days supply | Qty: 90 | Fill #1

## 2018-08-01 DIAGNOSIS — J060 Acute laryngopharyngitis: Secondary | ICD-10-CM | POA: Diagnosis not present

## 2018-08-03 DIAGNOSIS — E782 Mixed hyperlipidemia: Secondary | ICD-10-CM | POA: Diagnosis not present

## 2018-08-03 DIAGNOSIS — E1141 Type 2 diabetes mellitus with diabetic mononeuropathy: Secondary | ICD-10-CM | POA: Diagnosis not present

## 2018-08-03 DIAGNOSIS — E1140 Type 2 diabetes mellitus with diabetic neuropathy, unspecified: Secondary | ICD-10-CM | POA: Diagnosis not present

## 2018-08-03 DIAGNOSIS — N182 Chronic kidney disease, stage 2 (mild): Secondary | ICD-10-CM | POA: Diagnosis not present

## 2018-08-03 DIAGNOSIS — I10 Essential (primary) hypertension: Secondary | ICD-10-CM | POA: Diagnosis not present

## 2018-08-10 DIAGNOSIS — E1140 Type 2 diabetes mellitus with diabetic neuropathy, unspecified: Secondary | ICD-10-CM | POA: Diagnosis not present

## 2018-08-10 DIAGNOSIS — E1122 Type 2 diabetes mellitus with diabetic chronic kidney disease: Secondary | ICD-10-CM | POA: Diagnosis not present

## 2018-08-10 DIAGNOSIS — Z0000 Encounter for general adult medical examination without abnormal findings: Secondary | ICD-10-CM | POA: Diagnosis not present

## 2018-08-10 DIAGNOSIS — N182 Chronic kidney disease, stage 2 (mild): Secondary | ICD-10-CM | POA: Diagnosis not present

## 2019-02-08 DIAGNOSIS — I10 Essential (primary) hypertension: Secondary | ICD-10-CM | POA: Diagnosis not present

## 2019-02-08 DIAGNOSIS — E782 Mixed hyperlipidemia: Secondary | ICD-10-CM | POA: Diagnosis not present

## 2019-02-08 DIAGNOSIS — N182 Chronic kidney disease, stage 2 (mild): Secondary | ICD-10-CM | POA: Diagnosis not present

## 2019-02-08 DIAGNOSIS — E1122 Type 2 diabetes mellitus with diabetic chronic kidney disease: Secondary | ICD-10-CM | POA: Diagnosis not present

## 2019-02-08 DIAGNOSIS — E1141 Type 2 diabetes mellitus with diabetic mononeuropathy: Secondary | ICD-10-CM | POA: Diagnosis not present

## 2019-02-08 DIAGNOSIS — E1140 Type 2 diabetes mellitus with diabetic neuropathy, unspecified: Secondary | ICD-10-CM | POA: Diagnosis not present

## 2019-02-11 DIAGNOSIS — E782 Mixed hyperlipidemia: Secondary | ICD-10-CM | POA: Diagnosis not present

## 2019-02-11 DIAGNOSIS — E1122 Type 2 diabetes mellitus with diabetic chronic kidney disease: Secondary | ICD-10-CM | POA: Diagnosis not present

## 2019-02-11 DIAGNOSIS — N182 Chronic kidney disease, stage 2 (mild): Secondary | ICD-10-CM | POA: Diagnosis not present

## 2019-02-11 DIAGNOSIS — E1140 Type 2 diabetes mellitus with diabetic neuropathy, unspecified: Secondary | ICD-10-CM | POA: Diagnosis not present

## 2019-02-15 ENCOUNTER — Other Ambulatory Visit (HOSPITAL_COMMUNITY): Payer: Self-pay | Admitting: Internal Medicine

## 2019-02-15 DIAGNOSIS — Z1231 Encounter for screening mammogram for malignant neoplasm of breast: Secondary | ICD-10-CM

## 2019-02-27 ENCOUNTER — Other Ambulatory Visit: Payer: Self-pay

## 2019-02-27 ENCOUNTER — Ambulatory Visit (HOSPITAL_COMMUNITY)
Admission: RE | Admit: 2019-02-27 | Discharge: 2019-02-27 | Disposition: A | Discharge status: Home / Self Care | Payer: BC Managed Care – PPO | Source: Ambulatory Visit | Attending: Internal Medicine | Admitting: Internal Medicine

## 2019-02-27 DIAGNOSIS — Z1231 Encounter for screening mammogram for malignant neoplasm of breast: Secondary | ICD-10-CM | POA: Diagnosis not present

## 2019-03-04 DIAGNOSIS — L409 Psoriasis, unspecified: Secondary | ICD-10-CM | POA: Diagnosis not present

## 2019-03-04 DIAGNOSIS — E782 Mixed hyperlipidemia: Secondary | ICD-10-CM | POA: Diagnosis not present

## 2019-03-04 DIAGNOSIS — N182 Chronic kidney disease, stage 2 (mild): Secondary | ICD-10-CM | POA: Diagnosis not present

## 2019-03-04 DIAGNOSIS — F411 Generalized anxiety disorder: Secondary | ICD-10-CM | POA: Diagnosis not present

## 2019-03-04 DIAGNOSIS — J441 Chronic obstructive pulmonary disease with (acute) exacerbation: Secondary | ICD-10-CM | POA: Diagnosis not present

## 2019-03-04 DIAGNOSIS — R5383 Other fatigue: Secondary | ICD-10-CM | POA: Diagnosis not present

## 2019-03-04 DIAGNOSIS — F339 Major depressive disorder, recurrent, unspecified: Secondary | ICD-10-CM | POA: Diagnosis not present

## 2019-03-04 DIAGNOSIS — E1141 Type 2 diabetes mellitus with diabetic mononeuropathy: Secondary | ICD-10-CM | POA: Diagnosis not present

## 2019-03-04 DIAGNOSIS — E1122 Type 2 diabetes mellitus with diabetic chronic kidney disease: Secondary | ICD-10-CM | POA: Diagnosis not present

## 2019-05-20 DIAGNOSIS — E1141 Type 2 diabetes mellitus with diabetic mononeuropathy: Secondary | ICD-10-CM | POA: Diagnosis not present

## 2019-05-20 DIAGNOSIS — I10 Essential (primary) hypertension: Secondary | ICD-10-CM | POA: Diagnosis not present

## 2019-05-20 DIAGNOSIS — E782 Mixed hyperlipidemia: Secondary | ICD-10-CM | POA: Diagnosis not present

## 2019-05-20 DIAGNOSIS — E1122 Type 2 diabetes mellitus with diabetic chronic kidney disease: Secondary | ICD-10-CM | POA: Diagnosis not present

## 2019-05-20 DIAGNOSIS — E1140 Type 2 diabetes mellitus with diabetic neuropathy, unspecified: Secondary | ICD-10-CM | POA: Diagnosis not present

## 2019-05-23 DIAGNOSIS — G9009 Other idiopathic peripheral autonomic neuropathy: Secondary | ICD-10-CM | POA: Diagnosis not present

## 2019-05-23 DIAGNOSIS — N182 Chronic kidney disease, stage 2 (mild): Secondary | ICD-10-CM | POA: Diagnosis not present

## 2019-05-23 DIAGNOSIS — E1140 Type 2 diabetes mellitus with diabetic neuropathy, unspecified: Secondary | ICD-10-CM | POA: Diagnosis not present

## 2019-05-23 DIAGNOSIS — E782 Mixed hyperlipidemia: Secondary | ICD-10-CM | POA: Diagnosis not present

## 2019-08-29 DIAGNOSIS — I10 Essential (primary) hypertension: Secondary | ICD-10-CM | POA: Diagnosis not present

## 2019-08-29 DIAGNOSIS — E782 Mixed hyperlipidemia: Secondary | ICD-10-CM | POA: Diagnosis not present

## 2019-08-29 DIAGNOSIS — E1122 Type 2 diabetes mellitus with diabetic chronic kidney disease: Secondary | ICD-10-CM | POA: Diagnosis not present

## 2019-08-29 DIAGNOSIS — E1140 Type 2 diabetes mellitus with diabetic neuropathy, unspecified: Secondary | ICD-10-CM | POA: Diagnosis not present

## 2019-08-29 DIAGNOSIS — E1141 Type 2 diabetes mellitus with diabetic mononeuropathy: Secondary | ICD-10-CM | POA: Diagnosis not present

## 2019-09-03 DIAGNOSIS — G9009 Other idiopathic peripheral autonomic neuropathy: Secondary | ICD-10-CM | POA: Diagnosis not present

## 2019-09-03 DIAGNOSIS — N182 Chronic kidney disease, stage 2 (mild): Secondary | ICD-10-CM | POA: Diagnosis not present

## 2019-09-03 DIAGNOSIS — E1140 Type 2 diabetes mellitus with diabetic neuropathy, unspecified: Secondary | ICD-10-CM | POA: Diagnosis not present

## 2019-09-03 DIAGNOSIS — E782 Mixed hyperlipidemia: Secondary | ICD-10-CM | POA: Diagnosis not present

## 2019-11-05 DIAGNOSIS — M25512 Pain in left shoulder: Secondary | ICD-10-CM | POA: Diagnosis not present

## 2019-12-10 DIAGNOSIS — E1140 Type 2 diabetes mellitus with diabetic neuropathy, unspecified: Secondary | ICD-10-CM | POA: Diagnosis not present

## 2019-12-10 DIAGNOSIS — E1122 Type 2 diabetes mellitus with diabetic chronic kidney disease: Secondary | ICD-10-CM | POA: Diagnosis not present

## 2019-12-10 DIAGNOSIS — E1141 Type 2 diabetes mellitus with diabetic mononeuropathy: Secondary | ICD-10-CM | POA: Diagnosis not present

## 2019-12-12 DIAGNOSIS — E1140 Type 2 diabetes mellitus with diabetic neuropathy, unspecified: Secondary | ICD-10-CM | POA: Diagnosis not present

## 2019-12-12 DIAGNOSIS — G9009 Other idiopathic peripheral autonomic neuropathy: Secondary | ICD-10-CM | POA: Diagnosis not present

## 2019-12-12 DIAGNOSIS — E782 Mixed hyperlipidemia: Secondary | ICD-10-CM | POA: Diagnosis not present

## 2019-12-12 DIAGNOSIS — N182 Chronic kidney disease, stage 2 (mild): Secondary | ICD-10-CM | POA: Diagnosis not present

## 2020-03-18 DIAGNOSIS — E782 Mixed hyperlipidemia: Secondary | ICD-10-CM | POA: Diagnosis not present

## 2020-03-18 DIAGNOSIS — E1141 Type 2 diabetes mellitus with diabetic mononeuropathy: Secondary | ICD-10-CM | POA: Diagnosis not present

## 2020-03-18 DIAGNOSIS — N182 Chronic kidney disease, stage 2 (mild): Secondary | ICD-10-CM | POA: Diagnosis not present

## 2020-03-18 DIAGNOSIS — J441 Chronic obstructive pulmonary disease with (acute) exacerbation: Secondary | ICD-10-CM | POA: Diagnosis not present

## 2020-03-18 DIAGNOSIS — I10 Essential (primary) hypertension: Secondary | ICD-10-CM | POA: Diagnosis not present

## 2020-03-18 DIAGNOSIS — L409 Psoriasis, unspecified: Secondary | ICD-10-CM | POA: Diagnosis not present

## 2020-03-23 DIAGNOSIS — N182 Chronic kidney disease, stage 2 (mild): Secondary | ICD-10-CM | POA: Diagnosis not present

## 2020-03-23 DIAGNOSIS — G9009 Other idiopathic peripheral autonomic neuropathy: Secondary | ICD-10-CM | POA: Diagnosis not present

## 2020-03-23 DIAGNOSIS — E1140 Type 2 diabetes mellitus with diabetic neuropathy, unspecified: Secondary | ICD-10-CM | POA: Diagnosis not present

## 2020-03-23 DIAGNOSIS — E782 Mixed hyperlipidemia: Secondary | ICD-10-CM | POA: Diagnosis not present

## 2020-03-27 DIAGNOSIS — E119 Type 2 diabetes mellitus without complications: Secondary | ICD-10-CM | POA: Diagnosis not present

## 2020-05-09 IMAGING — MG DIGITAL SCREENING BILATERAL MAMMOGRAM WITH TOMO AND CAD
8 series · 8 of 24 positions shown · non-contrast
Comparison: Previous exam(s).

CLINICAL DATA: Screening.

EXAM:
DIGITAL SCREENING BILATERAL MAMMOGRAM WITH TOMO AND CAD

[L MLO synth-2D]
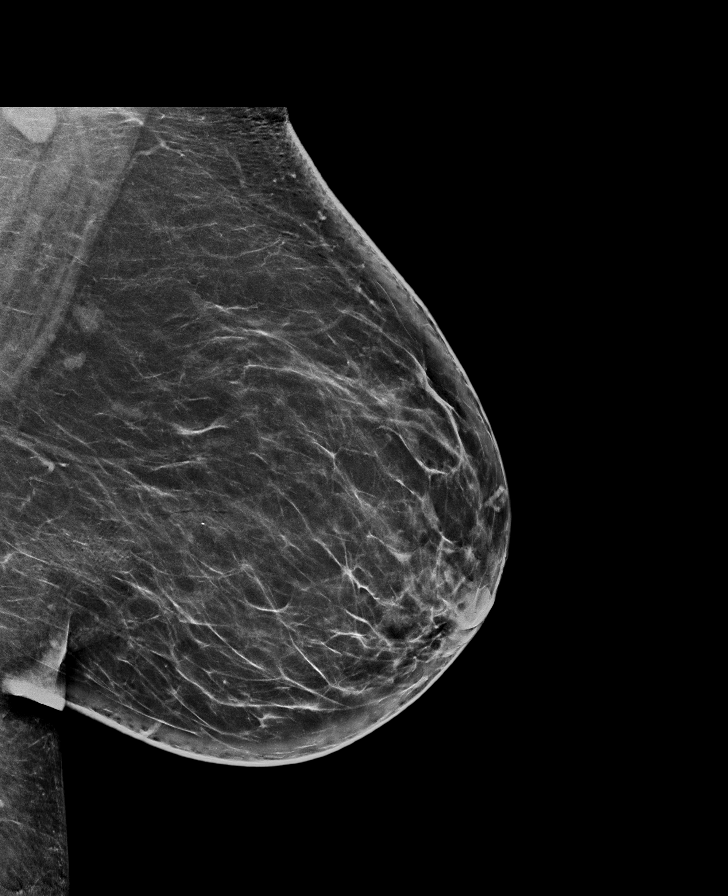

[R MLO synth-2D]
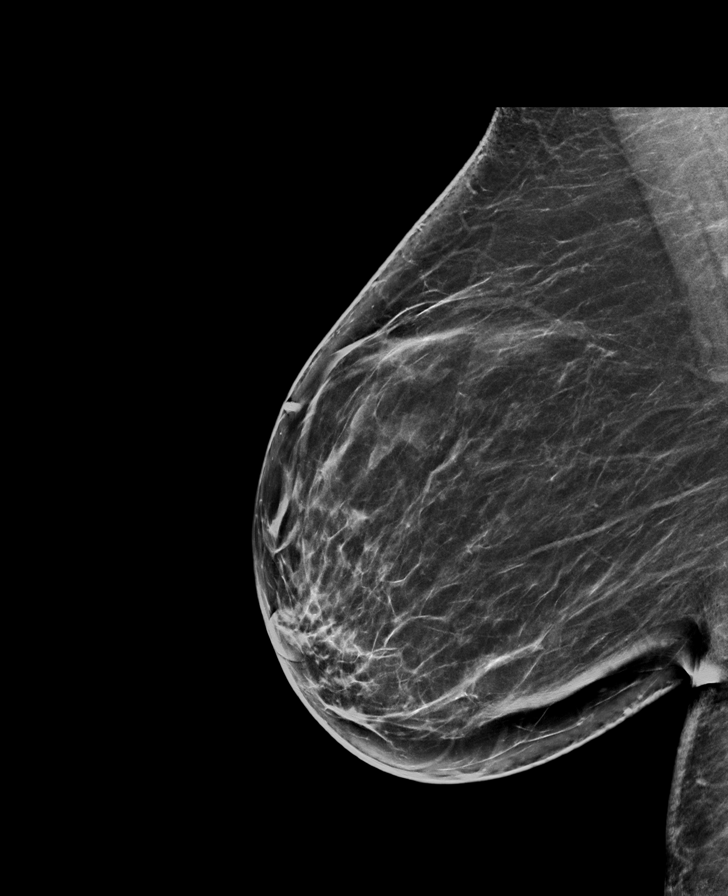

[L CC synth-2D]
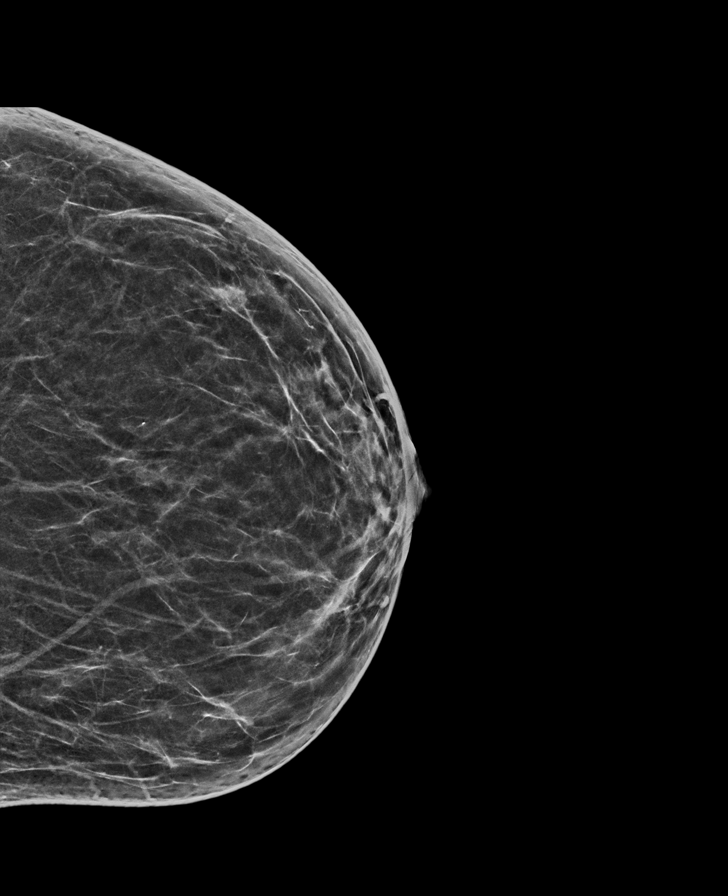

[R CC synth-2D]
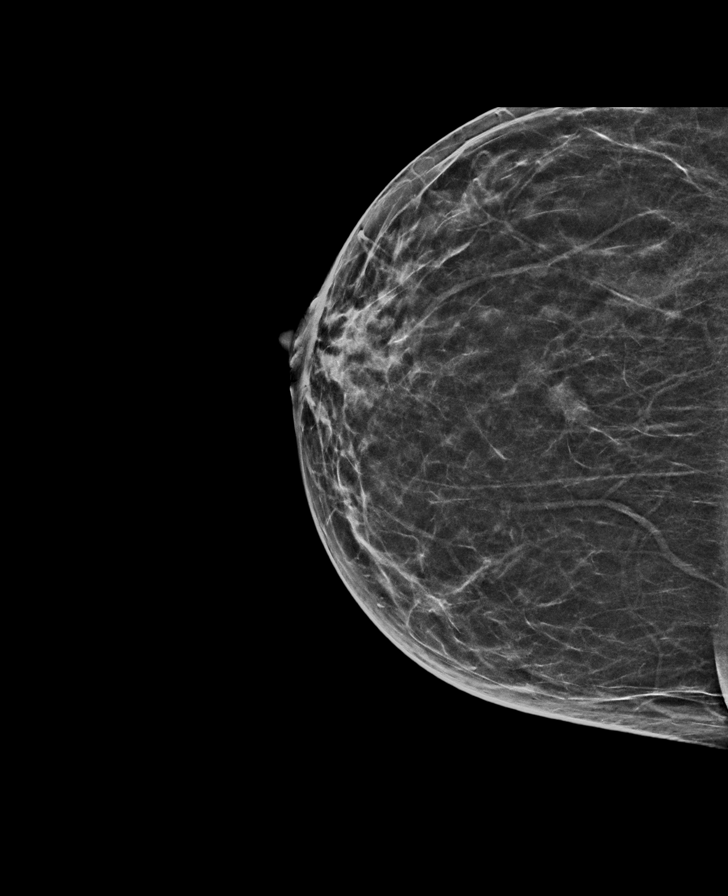

[L CC tomo · tomo slice 31/62.0]
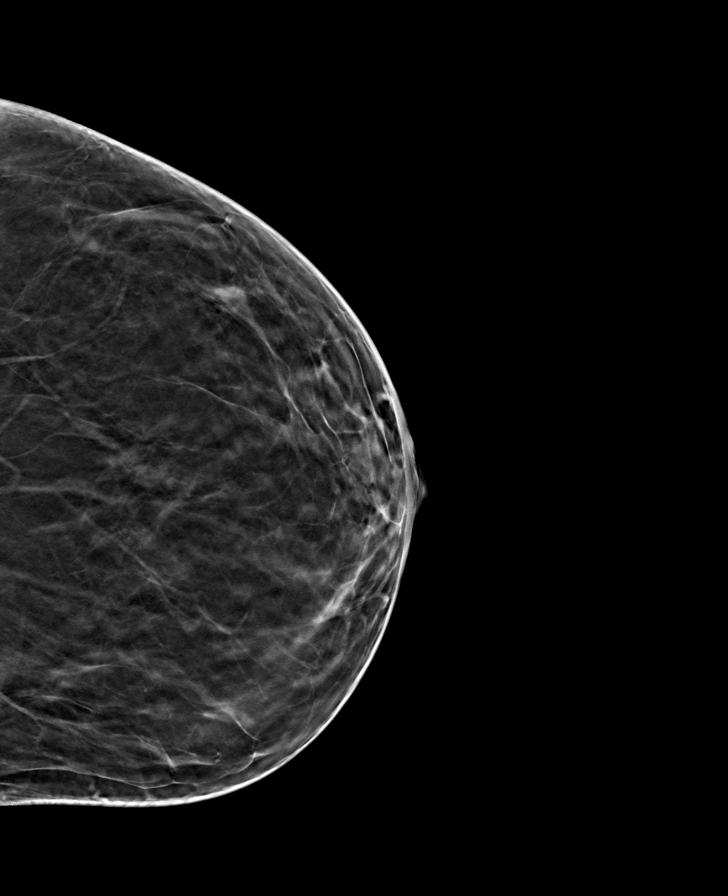

[R MLO tomo · tomo slice 37/74.0]
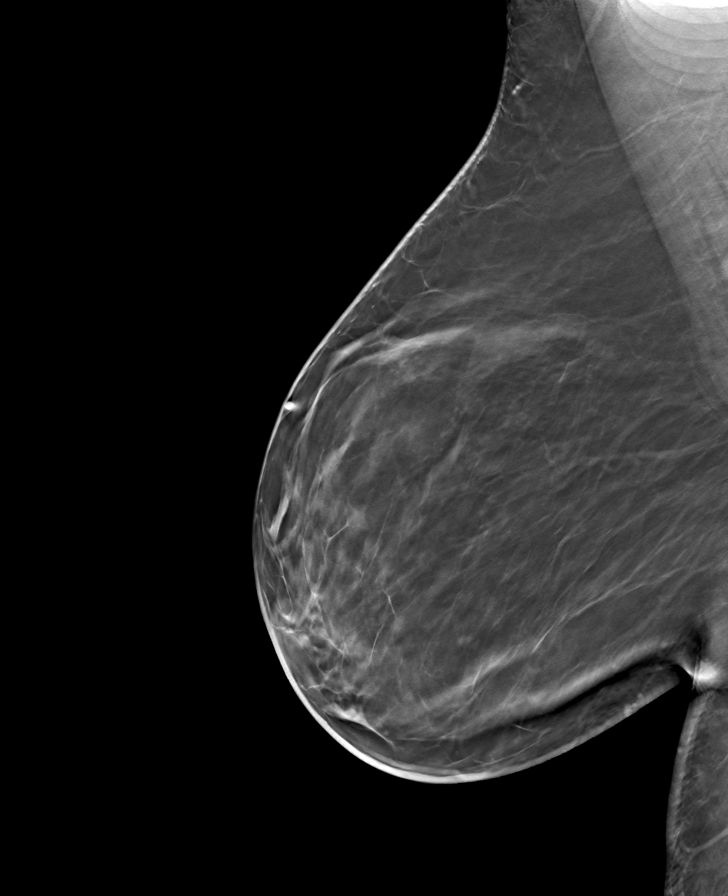

[L MLO tomo · tomo slice 40/79.0]
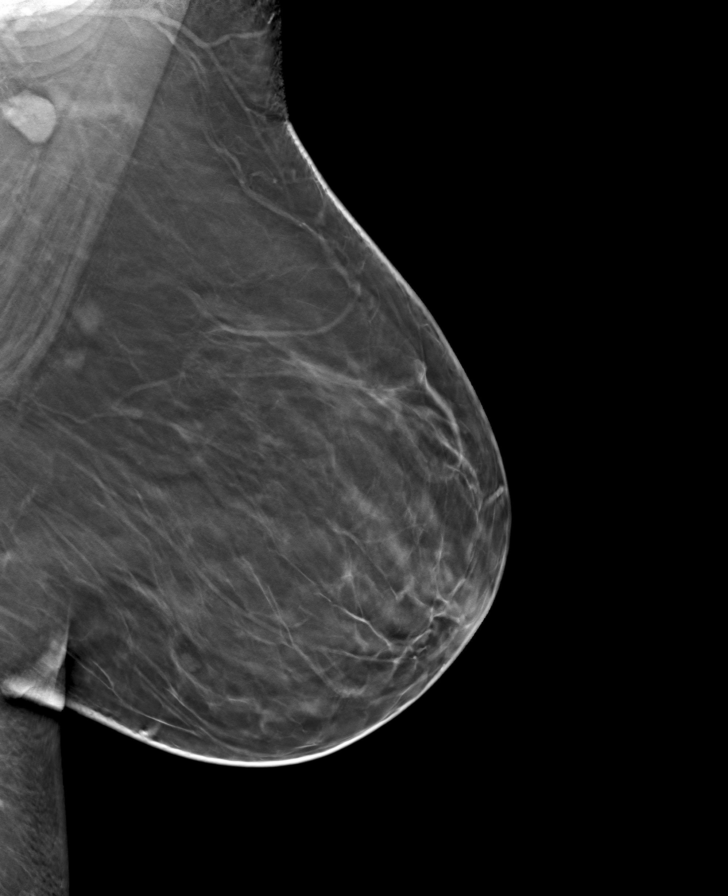

[R CC tomo · tomo slice 29/57.0]
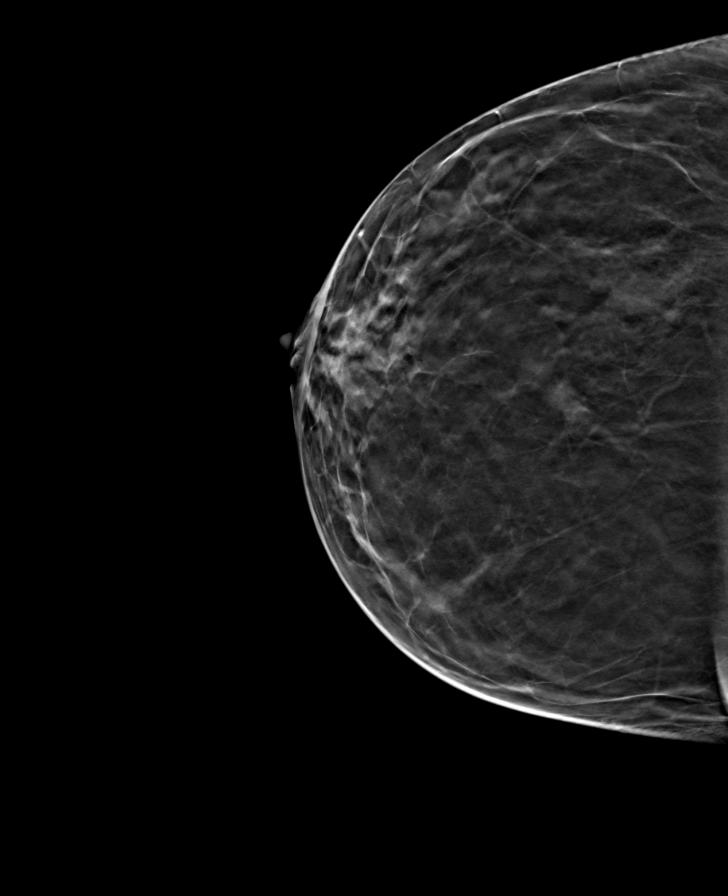

[8 of 24 positions shown; findings below may reference images not displayed]

ACR Breast Density Category b: There are scattered areas of
fibroglandular density.
FINDINGS: There are no findings suspicious for malignancy. Images were
processed with CAD.
IMPRESSION: No mammographic evidence of malignancy. A result letter of this
screening mammogram will be mailed directly to the patient.

RECOMMENDATION:
Screening mammogram in one year. (Code:[id number])

BI-RADS CATEGORY  1: Negative.

## 2021-01-05 ENCOUNTER — Other Ambulatory Visit: Payer: Self-pay | Admitting: Pain Medicine

## 2021-01-05 ENCOUNTER — Other Ambulatory Visit (HOSPITAL_COMMUNITY): Payer: Self-pay | Admitting: Pain Medicine

## 2021-01-05 DIAGNOSIS — M5416 Radiculopathy, lumbar region: Secondary | ICD-10-CM

## 2021-01-13 ENCOUNTER — Ambulatory Visit (HOSPITAL_COMMUNITY)
Admission: RE | Admit: 2021-01-13 | Discharge: 2021-01-13 | Disposition: A | Discharge status: Home / Self Care | Payer: Medicare PPO | Source: Ambulatory Visit | Attending: Pain Medicine | Admitting: Pain Medicine

## 2021-01-13 DIAGNOSIS — M5416 Radiculopathy, lumbar region: Secondary | ICD-10-CM | POA: Insufficient documentation

## 2021-01-13 MED ORDER — GADOBUTROL 1 MMOL/ML IV SOLN
10.0000 mL | Freq: Once | INTRAVENOUS | Status: AC | PRN
  Administered 2021-01-13: 10 mL via INTRAVENOUS

## 2021-02-25 DIAGNOSIS — M461 Sacroiliitis, not elsewhere classified: Secondary | ICD-10-CM | POA: Diagnosis not present

## 2021-03-15 ENCOUNTER — Ambulatory Visit: Payer: Medicare PPO | Admitting: Orthopedic Surgery

## 2021-03-15 ENCOUNTER — Ambulatory Visit: Payer: Medicare PPO

## 2021-03-15 ENCOUNTER — Other Ambulatory Visit: Payer: Self-pay

## 2021-03-15 ENCOUNTER — Encounter: Payer: Self-pay | Admitting: Orthopedic Surgery

## 2021-03-15 VITALS — BP 132/73 | HR 75 | Ht 64.0 in | Wt 239.2 lb

## 2021-03-15 DIAGNOSIS — M25512 Pain in left shoulder: Secondary | ICD-10-CM

## 2021-03-15 DIAGNOSIS — M7542 Impingement syndrome of left shoulder: Secondary | ICD-10-CM | POA: Diagnosis not present

## 2021-03-15 NOTE — Progress Notes (Signed)
Chief Complaint  Patient presents with   Shoulder Pain    Lt shoulder x 3 mths//no known injury    65 year old female with 51-month history of right shoulder pain no trauma  Review of systems heartburn joint pain depression otherwise negative medical problems diabetes reflux hypertension positive for smoking   Past Medical History:  Diagnosis Date   Anxiety    Arthritis    Asthma    related to bronchitis-no recent issues   Depression    Diabetes mellitus    GERD (gastroesophageal reflux disease)    Headache(784.0)    Hypercholesterolemia    Hypertension    Peripheral neuropathy    PONV (postoperative nausea and vomiting)    Psoriasis    BP 132/73   Pulse 75   Ht 5\' 4"  (1.626 m)   Wt 239 lb 3.2 oz (108.5 kg)   BMI 41.06 kg/m   s left shoulder is painful over the deltoid and upper arm but not below the elbow  She has abduction to 90 degrees with its painful and then she can abduct up to 120 degrees  Forward elevation is 150 degrees  She has a positive impingement sign with Neer and Hawkins maneuver  The empty can test shows good strength but pain with resistance  X-rays were normal there is a cervical fusion noted on x-ray but the glenohumeral joint was normal  Recommend subacromial injection and home exercises  Encounter Diagnoses  Name Primary?   Acute pain of left shoulder    Impingement syndrome of left shoulder Yes    Procedure note  Injection  Verbal consent was obtained to inject the  left shoulder   Timeout procedure was completed to confirm injection site  Diagnosis  Encounter Diagnoses  Name Primary?   Acute pain of left shoulder    Impingement syndrome of left shoulder Yes     Medications used Ce;estone 6 mg  Lidocaine 1% plain 3 cc  Anesthesia was provided by ethyl chloride spray  Prep was performed with alcohol  Technique of injection we used a posterior approach to the subacromial space injection was done without  complication  No complications were noted

## 2021-05-06 DIAGNOSIS — E782 Mixed hyperlipidemia: Secondary | ICD-10-CM | POA: Diagnosis not present

## 2021-05-06 DIAGNOSIS — E1169 Type 2 diabetes mellitus with other specified complication: Secondary | ICD-10-CM | POA: Diagnosis not present

## 2021-05-10 DIAGNOSIS — L409 Psoriasis, unspecified: Secondary | ICD-10-CM | POA: Diagnosis not present

## 2021-05-10 DIAGNOSIS — J449 Chronic obstructive pulmonary disease, unspecified: Secondary | ICD-10-CM | POA: Diagnosis not present

## 2021-05-10 DIAGNOSIS — E1169 Type 2 diabetes mellitus with other specified complication: Secondary | ICD-10-CM | POA: Diagnosis not present

## 2021-05-10 DIAGNOSIS — F17200 Nicotine dependence, unspecified, uncomplicated: Secondary | ICD-10-CM | POA: Diagnosis not present

## 2021-05-10 DIAGNOSIS — K219 Gastro-esophageal reflux disease without esophagitis: Secondary | ICD-10-CM | POA: Diagnosis not present

## 2021-05-10 DIAGNOSIS — N182 Chronic kidney disease, stage 2 (mild): Secondary | ICD-10-CM | POA: Diagnosis not present

## 2021-05-10 DIAGNOSIS — R809 Proteinuria, unspecified: Secondary | ICD-10-CM | POA: Diagnosis not present

## 2021-05-10 DIAGNOSIS — E782 Mixed hyperlipidemia: Secondary | ICD-10-CM | POA: Diagnosis not present

## 2021-05-10 DIAGNOSIS — E611 Iron deficiency: Secondary | ICD-10-CM | POA: Diagnosis not present

## 2021-09-03 DIAGNOSIS — E1165 Type 2 diabetes mellitus with hyperglycemia: Secondary | ICD-10-CM | POA: Diagnosis not present

## 2021-09-03 DIAGNOSIS — E039 Hypothyroidism, unspecified: Secondary | ICD-10-CM | POA: Diagnosis not present

## 2021-09-03 DIAGNOSIS — E1169 Type 2 diabetes mellitus with other specified complication: Secondary | ICD-10-CM | POA: Diagnosis not present

## 2021-09-03 DIAGNOSIS — E782 Mixed hyperlipidemia: Secondary | ICD-10-CM | POA: Diagnosis not present

## 2021-09-08 DIAGNOSIS — F17200 Nicotine dependence, unspecified, uncomplicated: Secondary | ICD-10-CM | POA: Diagnosis not present

## 2021-09-08 DIAGNOSIS — K219 Gastro-esophageal reflux disease without esophagitis: Secondary | ICD-10-CM | POA: Diagnosis not present

## 2021-09-08 DIAGNOSIS — R809 Proteinuria, unspecified: Secondary | ICD-10-CM | POA: Diagnosis not present

## 2021-09-08 DIAGNOSIS — N182 Chronic kidney disease, stage 2 (mild): Secondary | ICD-10-CM | POA: Diagnosis not present

## 2021-09-08 DIAGNOSIS — L409 Psoriasis, unspecified: Secondary | ICD-10-CM | POA: Diagnosis not present

## 2021-09-08 DIAGNOSIS — E782 Mixed hyperlipidemia: Secondary | ICD-10-CM | POA: Diagnosis not present

## 2021-09-08 DIAGNOSIS — E611 Iron deficiency: Secondary | ICD-10-CM | POA: Diagnosis not present

## 2021-09-08 DIAGNOSIS — E1169 Type 2 diabetes mellitus with other specified complication: Secondary | ICD-10-CM | POA: Diagnosis not present

## 2021-09-08 DIAGNOSIS — J449 Chronic obstructive pulmonary disease, unspecified: Secondary | ICD-10-CM | POA: Diagnosis not present

## 2021-10-13 DIAGNOSIS — L603 Nail dystrophy: Secondary | ICD-10-CM | POA: Diagnosis not present

## 2021-10-13 DIAGNOSIS — D2362 Other benign neoplasm of skin of left upper limb, including shoulder: Secondary | ICD-10-CM | POA: Diagnosis not present

## 2021-10-13 DIAGNOSIS — L218 Other seborrheic dermatitis: Secondary | ICD-10-CM | POA: Diagnosis not present

## 2021-12-23 DIAGNOSIS — M5416 Radiculopathy, lumbar region: Secondary | ICD-10-CM | POA: Diagnosis not present

## 2022-01-06 DIAGNOSIS — E782 Mixed hyperlipidemia: Secondary | ICD-10-CM | POA: Diagnosis not present

## 2022-01-06 DIAGNOSIS — E1169 Type 2 diabetes mellitus with other specified complication: Secondary | ICD-10-CM | POA: Diagnosis not present

## 2022-01-18 DIAGNOSIS — R809 Proteinuria, unspecified: Secondary | ICD-10-CM | POA: Diagnosis not present

## 2022-01-18 DIAGNOSIS — E1169 Type 2 diabetes mellitus with other specified complication: Secondary | ICD-10-CM | POA: Diagnosis not present

## 2022-01-18 DIAGNOSIS — E782 Mixed hyperlipidemia: Secondary | ICD-10-CM | POA: Diagnosis not present

## 2022-01-18 DIAGNOSIS — E611 Iron deficiency: Secondary | ICD-10-CM | POA: Diagnosis not present

## 2022-01-18 DIAGNOSIS — K219 Gastro-esophageal reflux disease without esophagitis: Secondary | ICD-10-CM | POA: Diagnosis not present

## 2022-01-18 DIAGNOSIS — F17200 Nicotine dependence, unspecified, uncomplicated: Secondary | ICD-10-CM | POA: Diagnosis not present

## 2022-01-18 DIAGNOSIS — L409 Psoriasis, unspecified: Secondary | ICD-10-CM | POA: Diagnosis not present

## 2022-01-18 DIAGNOSIS — N182 Chronic kidney disease, stage 2 (mild): Secondary | ICD-10-CM | POA: Diagnosis not present

## 2022-01-18 DIAGNOSIS — Z0001 Encounter for general adult medical examination with abnormal findings: Secondary | ICD-10-CM | POA: Diagnosis not present

## 2022-01-26 DIAGNOSIS — M47816 Spondylosis without myelopathy or radiculopathy, lumbar region: Secondary | ICD-10-CM | POA: Diagnosis not present

## 2022-01-26 DIAGNOSIS — M461 Sacroiliitis, not elsewhere classified: Secondary | ICD-10-CM | POA: Diagnosis not present

## 2022-01-26 DIAGNOSIS — Z6836 Body mass index (BMI) 36.0-36.9, adult: Secondary | ICD-10-CM | POA: Diagnosis not present

## 2022-02-25 DIAGNOSIS — M47816 Spondylosis without myelopathy or radiculopathy, lumbar region: Secondary | ICD-10-CM | POA: Diagnosis not present

## 2022-03-29 DIAGNOSIS — E119 Type 2 diabetes mellitus without complications: Secondary | ICD-10-CM | POA: Diagnosis not present

## 2022-03-30 DIAGNOSIS — M47816 Spondylosis without myelopathy or radiculopathy, lumbar region: Secondary | ICD-10-CM | POA: Diagnosis not present

## 2022-04-22 DIAGNOSIS — E1169 Type 2 diabetes mellitus with other specified complication: Secondary | ICD-10-CM | POA: Diagnosis not present

## 2022-04-22 DIAGNOSIS — E782 Mixed hyperlipidemia: Secondary | ICD-10-CM | POA: Diagnosis not present

## 2022-04-27 DIAGNOSIS — L409 Psoriasis, unspecified: Secondary | ICD-10-CM | POA: Diagnosis not present

## 2022-04-27 DIAGNOSIS — E782 Mixed hyperlipidemia: Secondary | ICD-10-CM | POA: Diagnosis not present

## 2022-04-27 DIAGNOSIS — J449 Chronic obstructive pulmonary disease, unspecified: Secondary | ICD-10-CM | POA: Diagnosis not present

## 2022-04-27 DIAGNOSIS — E611 Iron deficiency: Secondary | ICD-10-CM | POA: Diagnosis not present

## 2022-04-27 DIAGNOSIS — R809 Proteinuria, unspecified: Secondary | ICD-10-CM | POA: Diagnosis not present

## 2022-04-27 DIAGNOSIS — K219 Gastro-esophageal reflux disease without esophagitis: Secondary | ICD-10-CM | POA: Diagnosis not present

## 2022-04-27 DIAGNOSIS — N182 Chronic kidney disease, stage 2 (mild): Secondary | ICD-10-CM | POA: Diagnosis not present

## 2022-04-27 DIAGNOSIS — F17200 Nicotine dependence, unspecified, uncomplicated: Secondary | ICD-10-CM | POA: Diagnosis not present

## 2022-04-27 DIAGNOSIS — E1169 Type 2 diabetes mellitus with other specified complication: Secondary | ICD-10-CM | POA: Diagnosis not present

## 2022-05-30 DIAGNOSIS — M47816 Spondylosis without myelopathy or radiculopathy, lumbar region: Secondary | ICD-10-CM | POA: Diagnosis not present

## 2022-08-23 DIAGNOSIS — E782 Mixed hyperlipidemia: Secondary | ICD-10-CM | POA: Diagnosis not present

## 2022-08-23 DIAGNOSIS — E1169 Type 2 diabetes mellitus with other specified complication: Secondary | ICD-10-CM | POA: Diagnosis not present

## 2022-08-29 DIAGNOSIS — R809 Proteinuria, unspecified: Secondary | ICD-10-CM | POA: Diagnosis not present

## 2022-08-29 DIAGNOSIS — E1169 Type 2 diabetes mellitus with other specified complication: Secondary | ICD-10-CM | POA: Diagnosis not present

## 2022-08-29 DIAGNOSIS — F332 Major depressive disorder, recurrent severe without psychotic features: Secondary | ICD-10-CM | POA: Diagnosis not present

## 2022-08-29 DIAGNOSIS — N182 Chronic kidney disease, stage 2 (mild): Secondary | ICD-10-CM | POA: Diagnosis not present

## 2022-08-29 DIAGNOSIS — K219 Gastro-esophageal reflux disease without esophagitis: Secondary | ICD-10-CM | POA: Diagnosis not present

## 2022-08-29 DIAGNOSIS — E782 Mixed hyperlipidemia: Secondary | ICD-10-CM | POA: Diagnosis not present

## 2022-08-29 DIAGNOSIS — F17200 Nicotine dependence, unspecified, uncomplicated: Secondary | ICD-10-CM | POA: Diagnosis not present

## 2022-08-29 DIAGNOSIS — G4700 Insomnia, unspecified: Secondary | ICD-10-CM | POA: Diagnosis not present

## 2022-08-29 DIAGNOSIS — E611 Iron deficiency: Secondary | ICD-10-CM | POA: Diagnosis not present

## 2022-08-30 ENCOUNTER — Other Ambulatory Visit (HOSPITAL_COMMUNITY): Payer: Self-pay | Admitting: Internal Medicine

## 2022-08-30 DIAGNOSIS — Z1231 Encounter for screening mammogram for malignant neoplasm of breast: Secondary | ICD-10-CM

## 2022-09-07 ENCOUNTER — Ambulatory Visit (HOSPITAL_COMMUNITY): Payer: Medicare PPO

## 2022-09-22 ENCOUNTER — Encounter: Payer: Self-pay | Admitting: Radiology

## 2022-10-20 ENCOUNTER — Ambulatory Visit (HOSPITAL_COMMUNITY)
Admission: RE | Admit: 2022-10-20 | Discharge: 2022-10-20 | Disposition: A | Discharge status: Home / Self Care | Payer: Medicare PPO | Source: Ambulatory Visit | Attending: Internal Medicine | Admitting: Internal Medicine

## 2022-10-20 DIAGNOSIS — Z1231 Encounter for screening mammogram for malignant neoplasm of breast: Secondary | ICD-10-CM | POA: Diagnosis not present

## 2022-12-22 DIAGNOSIS — E782 Mixed hyperlipidemia: Secondary | ICD-10-CM | POA: Diagnosis not present

## 2022-12-22 DIAGNOSIS — E1169 Type 2 diabetes mellitus with other specified complication: Secondary | ICD-10-CM | POA: Diagnosis not present

## 2022-12-28 DIAGNOSIS — F17200 Nicotine dependence, unspecified, uncomplicated: Secondary | ICD-10-CM | POA: Diagnosis not present

## 2022-12-28 DIAGNOSIS — L409 Psoriasis, unspecified: Secondary | ICD-10-CM | POA: Diagnosis not present

## 2022-12-28 DIAGNOSIS — E782 Mixed hyperlipidemia: Secondary | ICD-10-CM | POA: Diagnosis not present

## 2022-12-28 DIAGNOSIS — E1169 Type 2 diabetes mellitus with other specified complication: Secondary | ICD-10-CM | POA: Diagnosis not present

## 2022-12-28 DIAGNOSIS — E1122 Type 2 diabetes mellitus with diabetic chronic kidney disease: Secondary | ICD-10-CM | POA: Diagnosis not present

## 2022-12-28 DIAGNOSIS — N182 Chronic kidney disease, stage 2 (mild): Secondary | ICD-10-CM | POA: Diagnosis not present

## 2022-12-28 DIAGNOSIS — E611 Iron deficiency: Secondary | ICD-10-CM | POA: Diagnosis not present

## 2022-12-28 DIAGNOSIS — F17210 Nicotine dependence, cigarettes, uncomplicated: Secondary | ICD-10-CM | POA: Diagnosis not present

## 2022-12-28 DIAGNOSIS — R809 Proteinuria, unspecified: Secondary | ICD-10-CM | POA: Diagnosis not present

## 2023-01-25 DIAGNOSIS — J449 Chronic obstructive pulmonary disease, unspecified: Secondary | ICD-10-CM | POA: Diagnosis not present

## 2023-01-25 DIAGNOSIS — E559 Vitamin D deficiency, unspecified: Secondary | ICD-10-CM | POA: Diagnosis not present

## 2023-01-25 DIAGNOSIS — R5383 Other fatigue: Secondary | ICD-10-CM | POA: Diagnosis not present

## 2023-01-25 DIAGNOSIS — F17200 Nicotine dependence, unspecified, uncomplicated: Secondary | ICD-10-CM | POA: Diagnosis not present

## 2023-01-25 DIAGNOSIS — N182 Chronic kidney disease, stage 2 (mild): Secondary | ICD-10-CM | POA: Diagnosis not present

## 2023-01-25 DIAGNOSIS — E1122 Type 2 diabetes mellitus with diabetic chronic kidney disease: Secondary | ICD-10-CM | POA: Diagnosis not present

## 2023-01-25 DIAGNOSIS — L409 Psoriasis, unspecified: Secondary | ICD-10-CM | POA: Diagnosis not present

## 2023-01-25 DIAGNOSIS — M545 Low back pain, unspecified: Secondary | ICD-10-CM | POA: Diagnosis not present

## 2023-01-25 DIAGNOSIS — E611 Iron deficiency: Secondary | ICD-10-CM | POA: Diagnosis not present

## 2023-01-25 DIAGNOSIS — M47896 Other spondylosis, lumbar region: Secondary | ICD-10-CM | POA: Diagnosis not present

## 2023-01-25 DIAGNOSIS — E875 Hyperkalemia: Secondary | ICD-10-CM | POA: Diagnosis not present

## 2023-03-13 DIAGNOSIS — L218 Other seborrheic dermatitis: Secondary | ICD-10-CM | POA: Diagnosis not present

## 2023-03-13 DIAGNOSIS — L603 Nail dystrophy: Secondary | ICD-10-CM | POA: Diagnosis not present

## 2023-04-03 DIAGNOSIS — E119 Type 2 diabetes mellitus without complications: Secondary | ICD-10-CM | POA: Diagnosis not present

## 2023-05-23 DIAGNOSIS — E1169 Type 2 diabetes mellitus with other specified complication: Secondary | ICD-10-CM | POA: Diagnosis not present

## 2023-05-23 DIAGNOSIS — E782 Mixed hyperlipidemia: Secondary | ICD-10-CM | POA: Diagnosis not present

## 2023-05-30 DIAGNOSIS — L409 Psoriasis, unspecified: Secondary | ICD-10-CM | POA: Diagnosis not present

## 2023-05-30 DIAGNOSIS — E782 Mixed hyperlipidemia: Secondary | ICD-10-CM | POA: Diagnosis not present

## 2023-05-30 DIAGNOSIS — M545 Low back pain, unspecified: Secondary | ICD-10-CM | POA: Diagnosis not present

## 2023-05-30 DIAGNOSIS — E1122 Type 2 diabetes mellitus with diabetic chronic kidney disease: Secondary | ICD-10-CM | POA: Diagnosis not present

## 2023-05-30 DIAGNOSIS — N182 Chronic kidney disease, stage 2 (mild): Secondary | ICD-10-CM | POA: Diagnosis not present

## 2023-05-30 DIAGNOSIS — E1169 Type 2 diabetes mellitus with other specified complication: Secondary | ICD-10-CM | POA: Diagnosis not present

## 2023-05-30 DIAGNOSIS — G8929 Other chronic pain: Secondary | ICD-10-CM | POA: Diagnosis not present

## 2023-05-30 DIAGNOSIS — E1165 Type 2 diabetes mellitus with hyperglycemia: Secondary | ICD-10-CM | POA: Diagnosis not present

## 2023-05-30 DIAGNOSIS — E611 Iron deficiency: Secondary | ICD-10-CM | POA: Diagnosis not present

## 2023-09-28 DIAGNOSIS — E611 Iron deficiency: Secondary | ICD-10-CM | POA: Diagnosis not present

## 2023-09-28 DIAGNOSIS — E1169 Type 2 diabetes mellitus with other specified complication: Secondary | ICD-10-CM | POA: Diagnosis not present

## 2023-09-28 DIAGNOSIS — D510 Vitamin B12 deficiency anemia due to intrinsic factor deficiency: Secondary | ICD-10-CM | POA: Diagnosis not present

## 2023-09-28 DIAGNOSIS — E782 Mixed hyperlipidemia: Secondary | ICD-10-CM | POA: Diagnosis not present

## 2023-09-28 DIAGNOSIS — E559 Vitamin D deficiency, unspecified: Secondary | ICD-10-CM | POA: Diagnosis not present

## 2023-10-04 ENCOUNTER — Other Ambulatory Visit (HOSPITAL_COMMUNITY): Payer: Self-pay | Admitting: Internal Medicine

## 2023-10-04 DIAGNOSIS — E1169 Type 2 diabetes mellitus with other specified complication: Secondary | ICD-10-CM | POA: Diagnosis not present

## 2023-10-04 DIAGNOSIS — N182 Chronic kidney disease, stage 2 (mild): Secondary | ICD-10-CM | POA: Diagnosis not present

## 2023-10-04 DIAGNOSIS — Z13820 Encounter for screening for osteoporosis: Secondary | ICD-10-CM

## 2023-10-04 DIAGNOSIS — Z0001 Encounter for general adult medical examination with abnormal findings: Secondary | ICD-10-CM | POA: Diagnosis not present

## 2023-10-04 DIAGNOSIS — K219 Gastro-esophageal reflux disease without esophagitis: Secondary | ICD-10-CM | POA: Diagnosis not present

## 2023-10-04 DIAGNOSIS — E611 Iron deficiency: Secondary | ICD-10-CM | POA: Diagnosis not present

## 2023-10-04 DIAGNOSIS — Z1231 Encounter for screening mammogram for malignant neoplasm of breast: Secondary | ICD-10-CM

## 2023-10-04 DIAGNOSIS — E1165 Type 2 diabetes mellitus with hyperglycemia: Secondary | ICD-10-CM | POA: Diagnosis not present

## 2023-10-04 DIAGNOSIS — M545 Low back pain, unspecified: Secondary | ICD-10-CM | POA: Diagnosis not present

## 2023-10-04 DIAGNOSIS — F17200 Nicotine dependence, unspecified, uncomplicated: Secondary | ICD-10-CM | POA: Diagnosis not present

## 2023-10-04 DIAGNOSIS — J302 Other seasonal allergic rhinitis: Secondary | ICD-10-CM | POA: Diagnosis not present

## 2023-10-12 ENCOUNTER — Encounter: Payer: Self-pay | Admitting: *Deleted

## 2023-10-18 ENCOUNTER — Other Ambulatory Visit (HOSPITAL_COMMUNITY)

## 2023-10-23 ENCOUNTER — Ambulatory Visit (HOSPITAL_COMMUNITY)
Admission: RE | Admit: 2023-10-23 | Discharge: 2023-10-23 | Disposition: A | Discharge status: Home / Self Care | Source: Ambulatory Visit | Attending: Internal Medicine | Admitting: Internal Medicine

## 2023-10-23 DIAGNOSIS — F17200 Nicotine dependence, unspecified, uncomplicated: Secondary | ICD-10-CM | POA: Diagnosis not present

## 2023-10-23 DIAGNOSIS — Z13820 Encounter for screening for osteoporosis: Secondary | ICD-10-CM | POA: Diagnosis not present

## 2023-10-23 DIAGNOSIS — Z780 Asymptomatic menopausal state: Secondary | ICD-10-CM | POA: Diagnosis not present

## 2023-10-23 DIAGNOSIS — Z1231 Encounter for screening mammogram for malignant neoplasm of breast: Secondary | ICD-10-CM | POA: Insufficient documentation

## 2023-10-23 DIAGNOSIS — M810 Age-related osteoporosis without current pathological fracture: Secondary | ICD-10-CM | POA: Insufficient documentation

## 2023-11-02 ENCOUNTER — Telehealth: Payer: Self-pay | Admitting: *Deleted

## 2023-11-02 NOTE — Telephone Encounter (Signed)
  Procedure: COLONOSCOPY  Height: 5'4 Weight: 240LBS       Have you had a colonoscopy before?  2010, Dr. Darrick Penna  Do you have family history of colon cancer?  no  Do you have a family history of polyps? yes  Previous colonoscopy with polyps removed? Not sure  Do you have a history colorectal cancer?   no  Are you diabetic?  yes  Do you have a prosthetic or mechanical heart valve? no  Do you have a pacemaker/defibrillator?   no  Have you had endocarditis/atrial fibrillation?  no  Do you use supplemental oxygen/CPAP?  no  Have you had joint replacement within the last 12 months?  no  Do you tend to be constipated or have to use laxatives?  no   Do you have history of alcohol use? If yes, how much and how often.  no  Do you have history or are you using drugs? If yes, what do are you  using?  no  Have you ever had a stroke/heart attack?    Have you ever had a heart or other vascular stent placed,?  Do you take weight loss medication? no  female patients,: have you had a hysterectomy?                               are you post menopausal?  yes                              do you still have your menstrual cycle? no    Date of last menstrual period?   Do you take any blood-thinning medications such as: (Plavix, aspirin, Coumadin, Aggrenox, Brilinta, Xarelto, Eliquis, Pradaxa, Savaysa or Effient)? Aspirin 81mg   If yes we need the name, milligram, dosage and who is prescribing doctor:               Current Outpatient Medications  Medication Sig Dispense Refill   Cholecalciferol (VITAMIN D3) 50 MCG (2000 UT) TABS Take by mouth daily.     co-enzyme Q-10 30 MG capsule Take 30 mg by mouth daily.     cyanocobalamin (VITAMIN B12) 1000 MCG tablet Take 1,000 mcg by mouth daily.     levocetirizine (XYZAL) 5 MG tablet Take 5 mg by mouth every evening.     Melatonin 10 MG TABS Take by mouth at bedtime.     Multiple Vitamin (MULTIVITAMIN) tablet Take 1 tablet by mouth daily. W/ iron      aspirin EC 81 MG tablet Take 81 mg by mouth every morning.     escitalopram (LEXAPRO) 20 MG tablet Take 20 mg by mouth daily.     glipiZIDE (GLUCOTROL XL) 2.5 MG 24 hr tablet Take 2.5 mg by mouth daily.     rosuvastatin (CRESTOR) 5 MG tablet Take 5 mg by mouth at bedtime.      traZODone (DESYREL) 100 MG tablet Take 100 mg by mouth at bedtime.     No current facility-administered medications for this visit.    Allergies  Allergen Reactions   Codeine Other (See Comments)    REACTION: hallucinations   Gabapentin Other (See Comments)    Made her" feel like her head was in a fog"   Levofloxacin Nausea And Vomiting   Adhesive [Tape] Itching and Rash

## 2023-11-27 NOTE — Telephone Encounter (Signed)
 ASA 2 -hold glipizide morning of procedure.

## 2023-11-28 ENCOUNTER — Encounter (INDEPENDENT_AMBULATORY_CARE_PROVIDER_SITE_OTHER): Payer: Self-pay | Admitting: *Deleted

## 2023-11-28 ENCOUNTER — Encounter: Payer: Self-pay | Admitting: *Deleted

## 2023-11-28 NOTE — Telephone Encounter (Signed)
 Referral completed, TCS apt letter sent to PCP

## 2023-11-28 NOTE — Telephone Encounter (Signed)
 Pt has been scheduled for 12/26/23 with Dr.Carver.  Pt would like to have the pill prep because she says she gets sick when she has to drink the liquids prep. Instructions printed and placed up front for pt to pick up

## 2023-12-14 ENCOUNTER — Other Ambulatory Visit (HOSPITAL_COMMUNITY)

## 2023-12-22 NOTE — Telephone Encounter (Signed)
 [employer] PA:  Approved Authorization #[id number]  Tracking #[id number] Dates of service 12/26/2023 - 03/27/2024

## 2023-12-26 ENCOUNTER — Encounter (HOSPITAL_COMMUNITY): Admission: RE | Payer: Self-pay | Source: Home / Self Care

## 2023-12-26 ENCOUNTER — Ambulatory Visit (HOSPITAL_COMMUNITY): Admission: RE | Admit: 2023-12-26 | Source: Home / Self Care | Admitting: Internal Medicine

## 2023-12-26 SURGERY — COLONOSCOPY
Anesthesia: Choice

## 2023-12-28 DIAGNOSIS — F411 Generalized anxiety disorder: Secondary | ICD-10-CM | POA: Diagnosis not present

## 2023-12-28 DIAGNOSIS — Z6841 Body Mass Index (BMI) 40.0 and over, adult: Secondary | ICD-10-CM | POA: Diagnosis not present

## 2023-12-28 DIAGNOSIS — Z713 Dietary counseling and surveillance: Secondary | ICD-10-CM | POA: Diagnosis not present

## 2023-12-28 DIAGNOSIS — Z7182 Exercise counseling: Secondary | ICD-10-CM | POA: Diagnosis not present

## 2023-12-28 DIAGNOSIS — F332 Major depressive disorder, recurrent severe without psychotic features: Secondary | ICD-10-CM | POA: Diagnosis not present

## 2023-12-28 DIAGNOSIS — K219 Gastro-esophageal reflux disease without esophagitis: Secondary | ICD-10-CM | POA: Diagnosis not present

## 2024-01-31 DIAGNOSIS — E782 Mixed hyperlipidemia: Secondary | ICD-10-CM | POA: Diagnosis not present

## 2024-01-31 DIAGNOSIS — E559 Vitamin D deficiency, unspecified: Secondary | ICD-10-CM | POA: Diagnosis not present

## 2024-01-31 DIAGNOSIS — E1169 Type 2 diabetes mellitus with other specified complication: Secondary | ICD-10-CM | POA: Diagnosis not present

## 2024-01-31 DIAGNOSIS — E611 Iron deficiency: Secondary | ICD-10-CM | POA: Diagnosis not present

## 2024-02-01 ENCOUNTER — Encounter: Payer: Self-pay | Admitting: Orthopedic Surgery

## 2024-02-01 ENCOUNTER — Other Ambulatory Visit (INDEPENDENT_AMBULATORY_CARE_PROVIDER_SITE_OTHER): Payer: Self-pay

## 2024-02-01 ENCOUNTER — Ambulatory Visit: Admitting: Orthopedic Surgery

## 2024-02-01 VITALS — BP 152/74 | HR 86 | Ht 64.0 in | Wt 250.0 lb

## 2024-02-01 DIAGNOSIS — R202 Paresthesia of skin: Secondary | ICD-10-CM | POA: Diagnosis not present

## 2024-02-01 DIAGNOSIS — M25551 Pain in right hip: Secondary | ICD-10-CM | POA: Diagnosis not present

## 2024-02-01 DIAGNOSIS — M25552 Pain in left hip: Secondary | ICD-10-CM | POA: Diagnosis not present

## 2024-02-01 DIAGNOSIS — M79641 Pain in right hand: Secondary | ICD-10-CM | POA: Diagnosis not present

## 2024-02-01 DIAGNOSIS — G8929 Other chronic pain: Secondary | ICD-10-CM

## 2024-02-01 DIAGNOSIS — M79601 Pain in right arm: Secondary | ICD-10-CM

## 2024-02-01 DIAGNOSIS — M25512 Pain in left shoulder: Secondary | ICD-10-CM

## 2024-02-01 DIAGNOSIS — M79602 Pain in left arm: Secondary | ICD-10-CM

## 2024-02-01 DIAGNOSIS — L4050 Arthropathic psoriasis, unspecified: Secondary | ICD-10-CM

## 2024-02-01 MED ORDER — MELOXICAM 7.5 MG PO TABS: 7.5000 mg | ORAL_TABLET | Freq: Every day | ORAL | 5 refills | Status: DC

## 2024-02-01 NOTE — Progress Notes (Signed)
 Subjective:     Patient ID: Dawn Kirk, female   DOB: Mar 18, 1956, 68 y.o.   MRN: [id number]  Chief Complaint  Patient presents with   Joint Pain    Multiple joints hurt states left shoulder is the worst today     67 year old female status post left carpal tunnel release presents with history of lumbar fusion and cervical fusion complaining of left small finger numbness radiating into her forearm and pain in her left index finger MCP joint  She also complains of neck and shoulder pain with pain starting in the midline of the cervical spine radiating into the left trapezius into the left shoulder  This causes altered range of motion with decreased abduction and flexion  There is also pain with flexion at 90 degrees  So far no interventions have been tried  Other complaints multiple joint pain bilateral hip pain located on the side of the iliac crest bilaterally  This is noted on exam to include lower back pain bilaterally     Review of Systems really just multiple joint aches patient says she has skin lesions from psoriatic arthritis     Objective:   Physical Exam  Left shoulder Skin is normal Tenderness is really in the cervical spine and trapezius muscle There is pain at 90 degrees of flexion and abduction Muscle strength for the rotator cuff is 5/5 Impingement sign is positive  Right and left hip  Normal range of motion Tenderness is in the lower back No pain with hip flexion  Right hand tenderness over the M CP joint with normal range of motion  Left hand small finger numbness is noted negative Tinel's at the elbow  Normal reflexes in the left and right elbow  DG Pelvis 1-2 Views Result Date: 02/01/2024 X-ray report Chief complaint bilateral hip pain Images AP pelvis Reading: Weightbearing area of the hip joint on the right and left are normal.  She may have some central narrowing of the joint and some inferior osteophyte.  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  The only abnormality I see is the humeral head is slightly higher than normal in terms of its position related to the medial edge of the scapula Impression: Possible rotator cuff tendinopathy/insufficiency without arthritis  DG Hand Complete Right Result Date: 02/01/2024 X-ray report Chief complaint MCP joint pain no trauma Images right hand 3 views Reading: Normal articulation is noted in the right index finger MCP joint I do not see any soft tissue swelling or degenerative changes Impression: Normal appearance of the right hand MCP joint       Assessment:     I think Dawn Kirk has back pain causing her some bilateral hip pain.  I think her neck is causing her left arm pain and numbness in the small finger.  However, to confirm that the neck is the source of her problem MRI of the shoulder is indicated especially with the finding of proximal migration of the humerus on the x-ray and the pain she is having with rotation and elevation of the shoulder.    Plan:     MRI left shoulder  Rheumatology consult to address her psoriatic arthritis  Nerve conduction study to delineate the  numbness in her left small finger and the neck pain  Start meloxicam  once a day as well

## 2024-02-01 NOTE — Progress Notes (Signed)
  Intake history:  BP (!) 152/74   Pulse 86   Ht 5' 4 (1.626 m)   Wt 250 lb (113.4 kg)   BMI 42.91 kg/m  Body mass index is 42.91 kg/m.    WHAT ARE WE SEEING YOU FOR TODAY?   left shoulder down lef tarm into hand/ states neck painful has had surgery   How long has this bothered you? (DOI?DOS?WS?)  approximately several  month(s) ago  Anticoag.  No  Diabetes Yes  Heart disease No  Hypertension Yes  SMOKING HX Yes  Kidney disease     Latest Ref Rng & Units 11/28/2017    8:06 AM 05/23/2017    8:04 AM 10/08/2015    4:27 PM  CMP  Glucose 65 - 99 mg/dL 881     BUN 6 - 20 mg/dL 15     Creatinine 9.55 - 1.00 mg/dL 8.88  8.69  8.69   Sodium 135 - 145 mmol/L 138     Potassium 3.5 - 5.1 mmol/L 3.6     Chloride 101 - 111 mmol/L 94     CO2 22 - 32 mmol/L 31     Calcium  8.9 - 10.3 mg/dL 9.4        Any ALLERGIES ______________________________________________   Treatment:  Have you taken:  Tylenol  No  Advil Yes  Had PT No  Had injection No  Other  _________________________

## 2024-02-01 NOTE — Patient Instructions (Signed)
 While we are working on your approval for MRI please go ahead and call to schedule your appointment with [facility] within at least one (1) week.   Central Scheduling [phone]   We are referring you to [facility] from [facility] address is [address] [place] The phone number is [phone]  The office will call you with an appointment Dr. Eldonna will do the nerve study   The Rheumatologist is in [place] on [address] they will call you expect appointment to be approximately 6 months from now

## 2024-02-06 DIAGNOSIS — M79601 Pain in right arm: Secondary | ICD-10-CM | POA: Diagnosis not present

## 2024-02-06 DIAGNOSIS — M25512 Pain in left shoulder: Secondary | ICD-10-CM | POA: Diagnosis not present

## 2024-02-06 DIAGNOSIS — R202 Paresthesia of skin: Secondary | ICD-10-CM | POA: Diagnosis not present

## 2024-02-06 DIAGNOSIS — E1165 Type 2 diabetes mellitus with hyperglycemia: Secondary | ICD-10-CM | POA: Diagnosis not present

## 2024-02-06 DIAGNOSIS — N182 Chronic kidney disease, stage 2 (mild): Secondary | ICD-10-CM | POA: Diagnosis not present

## 2024-02-06 DIAGNOSIS — E611 Iron deficiency: Secondary | ICD-10-CM | POA: Diagnosis not present

## 2024-02-06 DIAGNOSIS — M5416 Radiculopathy, lumbar region: Secondary | ICD-10-CM | POA: Diagnosis not present

## 2024-02-06 DIAGNOSIS — M79602 Pain in left arm: Secondary | ICD-10-CM | POA: Diagnosis not present

## 2024-02-06 DIAGNOSIS — E1169 Type 2 diabetes mellitus with other specified complication: Secondary | ICD-10-CM | POA: Diagnosis not present

## 2024-02-08 ENCOUNTER — Ambulatory Visit (HOSPITAL_COMMUNITY)
Admission: RE | Admit: 2024-02-08 | Discharge: 2024-02-08 | Disposition: A | Discharge status: Home / Self Care | Source: Ambulatory Visit | Attending: Orthopedic Surgery | Admitting: Orthopedic Surgery

## 2024-02-08 DIAGNOSIS — G8929 Other chronic pain: Secondary | ICD-10-CM | POA: Diagnosis not present

## 2024-02-08 DIAGNOSIS — M25512 Pain in left shoulder: Secondary | ICD-10-CM | POA: Diagnosis not present

## 2024-02-08 DIAGNOSIS — M7582 Other shoulder lesions, left shoulder: Secondary | ICD-10-CM | POA: Diagnosis not present

## 2024-02-08 DIAGNOSIS — M19012 Primary osteoarthritis, left shoulder: Secondary | ICD-10-CM | POA: Diagnosis not present

## 2024-02-08 DIAGNOSIS — M75112 Incomplete rotator cuff tear or rupture of left shoulder, not specified as traumatic: Secondary | ICD-10-CM | POA: Diagnosis not present

## 2024-02-27 ENCOUNTER — Ambulatory Visit: Admitting: Physical Medicine and Rehabilitation

## 2024-02-27 DIAGNOSIS — R202 Paresthesia of skin: Secondary | ICD-10-CM | POA: Diagnosis not present

## 2024-02-27 DIAGNOSIS — M79642 Pain in left hand: Secondary | ICD-10-CM

## 2024-02-27 DIAGNOSIS — R29898 Other symptoms and signs involving the musculoskeletal system: Secondary | ICD-10-CM | POA: Diagnosis not present

## 2024-02-27 DIAGNOSIS — M79641 Pain in right hand: Secondary | ICD-10-CM | POA: Diagnosis not present

## 2024-02-27 NOTE — Progress Notes (Unsigned)
 Dawn Kirk - 68 y.o. female MRN [id number]  Date of birth: March 01, 1956  Office Visit Note: Visit Date: 02/27/2024 PCP: Shona Norleen PEDLAR, MD Referred by: Margrette Taft BRAVO, MD  Subjective: Chief Complaint  Patient presents with  . Left Hand - Numbness, Weakness, Pain  . Right Hand - Pain, Weakness, Numbness   HPI: Dawn Kirk is a 68 y.o. female who comes in today at the request of Dr. Taft Margrette for evaluation and management of chronic, worsening and severe pain, numbness and tingling in the Bilateral upper extremities.  Patient is Right hand dominant.  Left Carpal tunnel release 2019 [place]      ROS Otherwise per HPI.  Assessment & Plan: Visit Diagnoses:    ICD-10-CM   1. Paresthesia of skin  R20.2 NCV with EMG (electromyography)       Plan: No additional findings.   Meds & Orders: No orders of the defined types were placed in this encounter.   Orders Placed This Encounter  Procedures  . NCV with EMG (electromyography)    Follow-up: No follow-ups on file.   Procedures: No procedures performed      Clinical History: 4/16/2019Impression: The above electrodiagnostic study is ABNORMAL and reveals evidence of a mild to moderate LEFT median nerve entrapment at the wrist (carpal tunnel syndrome) affecting sensory and motor components.    There is no significant electrodiagnostic evidence of any other focal nerve entrapment, brachial plexopathy or cervical radiculopathy.   As you know, this particular electrodiagnostic study cannot rule out chemical radiculitis or sensory only radiculopathy.     This electrodiagnostic study cannot rule out small fiber polyneuropathy and dysesthesias from central pain sensitization syndromes such as fibromyalgia.  Myotomal referral pain from trigger points is also not excluded.     Recommendations: 1.  Follow-up with referring physician. 2.  Continue current management of symptoms. 3.  Continue use of resting splint at  night-time and as needed during the day.   Prentice Masters, MD   She reports that she has been smoking cigarettes. She has a 22.5 pack-year smoking history. She has never used smokeless tobacco. No results for input(s): HGBA1C, LABURIC in the last 8760 hours.  Objective:  VS:  HT:    WT:   BMI:     BP:   HR: bpm  TEMP: ( )  RESP:  Physical Exam  Ortho Exam  Imaging: No results found.  Past Medical/Family/Surgical/Social History: Medications & Allergies reviewed per EMR, new medications updated. Patient Active Problem List   Diagnosis Date Noted  . S/P carpal tunnel release left 11/30/17   . Essential hypertension 05/08/2014  . Pure hypercholesterolemia 05/08/2014  . Diabetes type 2, controlled (HCC) 05/08/2014  . Spondylolisthesis at L5-S1 level 02/01/2014  . KNEE PAIN 07/10/2007  . PES ANSERINUS TENDINITIS OR BURSITIS 07/10/2007   Past Medical History:  Diagnosis Date  . Anxiety   . Arthritis   . Asthma    related to bronchitis-no recent issues  . Depression   . Diabetes mellitus   . GERD (gastroesophageal reflux disease)   . Headache(784.0)   . Hypercholesterolemia   . Hypertension   . Peripheral neuropathy   . PONV (postoperative nausea and vomiting)   . Psoriasis    Family History  Problem Relation Age of Onset  . Asthma Other   . COPD Other   . Hypertension Other   . Stroke Other   . Heart disease Other   . Heart disease Father   .  Dementia Father   . Cancer Paternal Grandfather        lung  . Stroke Mother    Past Surgical History:  Procedure Laterality Date  . CARDIAC CATHETERIZATION  2011-[place]  . CARPAL TUNNEL RELEASE Left 11/30/2017   Procedure: CARPAL TUNNEL RELEASE;  Surgeon: Margrette Taft BRAVO, MD;  Location: [facility];  Service: Orthopedics;  Laterality: Left;  . CESAREAN SECTION  1984  . CHOLECYSTECTOMY  2003  . COLONOSCOPY  2009  . ENDOMETRIAL ABLATION  2006  . FOOT SURGERY  2006  . HAMMER TOE SURGERY  12/01/2011   Procedure: HAMMER  TOE CORRECTION;  Surgeon: Morene Donley Anon, DPM;  Location: [facility];  Service: Orthopedics;  Laterality: Right;  Arthroplasty 5th Digit Right Foot  . NECK SURGERY  x2-2004  . POSTERIOR LUMBAR FUSION N/A 02/01/2014   Procedure: POSTERIOR LUMBAR FUSION LUMBAR FIVE-SACRAL ONE INTERBODY FUSION WITH INTERBODY FUSION WITH INTERBODY PROSTHESIS POSTERIOR LATERAL ARTHRODESIS POSTERIOR NONSEGMENTAL INSTRUMENTATION;  Surgeon: Rockey LITTIE Peru, MD;  Location: [facility];  Service: Neurosurgery;  Laterality: N/A;  Lumbar 5 GILL procedure, POSTERIOR LATERAL ARTHRODESIS POSTERIOR NONSEGMENTAL INSTRUMENTATION  . REPLACEMENT TOTAL KNEE BILATERAL  2007   [facility], Dr. Margrette  . STERIOD INJECTION  07/08/2011   Procedure: STEROID INJECTION;  Surgeon: Donnice DELENA Robinsons, MD;  Location: [facility];  Service: Orthopedics;  Laterality: Left;  injection 1ml celestone  left index finger  . TUBAL LIGATION  1989  . WRIST ARTHROSCOPY  07/08/2011   Procedure: ARTHROSCOPY WRIST;  Surgeon: Donnice DELENA Robinsons, MD;  Location:  [facility];  Service: Orthopedics;  Laterality: Right;  right wrist with ganglionectomy,    Social History   Occupational History  . Not on file  Tobacco Use  . Smoking status: Every Day    Current packs/day: 0.50    Average packs/day: 0.5 packs/day for 45.0 years (22.5 ttl pk-yrs)    Types: Cigarettes  . Smokeless tobacco: Never  Vaping Use  . Vaping status: Never Used  Substance and Sexual Activity  . Alcohol use: No  . Drug use: No  . Sexual activity: Yes    Birth control/protection: Surgical    Comment: tubal and ablation

## 2024-02-27 NOTE — Progress Notes (Unsigned)
 Pain Scale   Average Pain 4 Patient advising she had pain, numbness and tingling in both hands. Patient also advising she has weakness.  Patient is Right Hand Dominate        +Driver, -BT, -Dye Allergies.

## 2024-02-29 ENCOUNTER — Encounter: Payer: Self-pay | Admitting: Physical Medicine and Rehabilitation

## 2024-02-29 NOTE — Procedures (Signed)
 EMG & NCV Findings: Evaluation of the left ulnar motor nerve showed decreased conduction velocity (B Elbow-Wrist, 48 m/s) and decreased conduction velocity (A Elbow-B Elbow, 45 m/s).  The right ulnar motor nerve showed decreased conduction velocity (B Elbow-Wrist, 51 m/s).  The left median (across palm) sensory nerve showed prolonged distal peak latency (Wrist, 3.8 ms) and prolonged distal peak latency (Palm, 2.2 ms).  The left ulnar sensory nerve showed prolonged distal peak latency (4.1 ms), reduced amplitude (7.0 V), and decreased conduction velocity (Wrist-5th Digit, 34 m/s).  All remaining nerves (as indicated in the following tables) were within normal limits.  Left vs. Right side comparison data for the ulnar motor nerve indicates abnormal L-R velocity difference (A Elbow-B Elbow, 32 m/s).  The ulnar sensory nerve indicates abnormal L-R latency difference (0.5 ms) and abnormal L-R amplitude difference (71.5 %).  All remaining left vs. right side differences were within normal limits.    All examined muscles (as indicated in the following table) showed no evidence of electrical instability.    Impression: The above electrodiagnostic study is ABNORMAL and reveals evidence of a mild left median nerve entrapment at the wrist affecting sensory components.  Careful clinical correlation is paramount.  This could represent recurrence of neuropathy at the wrist post decompression.  **The generalized borderline slowing of the ulnar nerve about the elbow seems to be technical artifact likely from anatomy.  There is no drop across the elbow that you would see with cubital tunnel syndrome.  There is no significant electrodiagnostic evidence of any other focal nerve entrapment, brachial plexopathy or cervical radiculopathy  Recommendations: 1.  Follow-up with referring physician. 2.  Continue current management of symptoms. 3.  Continue use of resting splint at night-time and as needed during the  day.  ___________________________ Prentice Eldonna BETTERS Board Certified, American Board of Physical Medicine and Rehabilitation    Nerve Conduction Studies Anti Sensory Summary Table   Stim Site NR Peak (ms) Norm Peak (ms) P-T Amp (V) Norm P-T Amp Site1 Site2 Delta-P (ms) Dist (cm) Vel (m/s) Norm Vel (m/s)  Left Median Acr Palm Anti Sensory (2nd Digit)  29.7C  Wrist    *3.8 <3.6 27.3 >10 Wrist Palm 1.6 0.0    Palm    *2.2 <2.0 5.1         Right Median Acr Palm Anti Sensory (2nd Digit)  30.6C  Wrist    3.6 <3.6 29.4 >10 Wrist Palm 1.6 0.0    Palm    2.0 <2.0 18.3         Left Radial Anti Sensory (Base 1st Digit)  30C  Wrist    2.2 <3.1 21.2  Wrist Base 1st Digit 2.2 0.0    Right Radial Anti Sensory (Base 1st Digit)  30C  Wrist    2.2 <3.1 25.0  Wrist Base 1st Digit 2.2 0.0    Left Ulnar Anti Sensory (5th Digit)  30C  Wrist    *4.1 <3.7 *7.0 >15.0 Wrist 5th Digit 4.1 14.0 *34 >38  Right Ulnar Anti Sensory (5th Digit)  30.9C  Wrist    3.6 <3.7 24.6 >15.0 Wrist 5th Digit 3.6 14.0 39 >38   Motor Summary Table   Stim Site NR Onset (ms) Norm Onset (ms) O-P Amp (mV) Norm O-P Amp Site1 Site2 Delta-0 (ms) Dist (cm) Vel (m/s) Norm Vel (m/s)  Left Median Motor (Abd Poll Brev)  30.3C  Wrist    3.9 <4.2 7.2 >5 Elbow Wrist 3.9 19.5 50 >50  Elbow  7.8  6.8         Right Median Motor (Abd Poll Brev)  30.4C  Wrist    3.6 <4.2 9.5 >5 Elbow Wrist 4.0 20.0 50 >50  Elbow    7.6  3.6         Left Ulnar Motor (Abd Dig Min)  30.3C  Wrist    3.7 <4.2 10.9 >3 B Elbow Wrist 4.0 19.0 *48 >53  B Elbow    7.7  6.2  A Elbow B Elbow 2.2 10.0 *45 >53  A Elbow    9.9  8.9         Right Ulnar Motor (Abd Dig Min)  30.7C  Wrist    3.3 <4.2 10.9 >3 B Elbow Wrist 3.4 17.5 *51 >53  B Elbow    6.7  6.1  A Elbow B Elbow 1.3 10.0 77 >53  A Elbow    8.0  10.3          EMG   Side Muscle Nerve Root Ins Act Fibs Psw Amp Dur Poly Recrt Int Bruna Comment  Left Abd Poll Brev Median C8-T1 Nml Nml Nml Nml Nml 0  Nml Nml   Left 1stDorInt Ulnar C8-T1 Nml Nml Nml Nml Nml 0 Nml Nml   Left PronatorTeres Median C6-7 Nml Nml Nml Nml Nml 0 Nml Nml   Left Biceps Musculocut C5-6 Nml Nml Nml Nml Nml 0 Nml Nml   Left Deltoid Axillary C5-6 Nml Nml Nml Nml Nml 0 Nml Nml     Nerve Conduction Studies Anti Sensory Left/Right Comparison   Stim Site L Lat (ms) R Lat (ms) L-R Lat (ms) L Amp (V) R Amp (V) L-R Amp (%) Site1 Site2 L Vel (m/s) R Vel (m/s) L-R Vel (m/s)  Median Acr Palm Anti Sensory (2nd Digit)  29.7C  Wrist *3.8 3.6 0.2 27.3 29.4 7.1 Wrist Palm     Palm *2.2 2.0 0.2 5.1 18.3 72.1       Radial Anti Sensory (Base 1st Digit)  30C  Wrist 2.2 2.2 0.0 21.2 25.0 15.2 Wrist Base 1st Digit     Ulnar Anti Sensory (5th Digit)  30C  Wrist *4.1 3.6 *0.5 *7.0 24.6 *71.5 Wrist 5th Digit *34 39 5   Motor Left/Right Comparison   Stim Site L Lat (ms) R Lat (ms) L-R Lat (ms) L Amp (mV) R Amp (mV) L-R Amp (%) Site1 Site2 L Vel (m/s) R Vel (m/s) L-R Vel (m/s)  Median Motor (Abd Poll Brev)  30.3C  Wrist 3.9 3.6 0.3 7.2 9.5 24.2 Elbow Wrist 50 50 0  Elbow 7.8 7.6 0.2 6.8 3.6 47.1       Ulnar Motor (Abd Dig Min)  30.3C  Wrist 3.7 3.3 0.4 10.9 10.9 0.0 B Elbow Wrist *48 *51 3  B Elbow 7.7 6.7 1.0 6.2 6.1 1.6 A Elbow B Elbow *45 77 *32  A Elbow 9.9 8.0 1.9 8.9 10.3 13.6          Waveforms:

## 2024-03-18 ENCOUNTER — Telehealth: Payer: Self-pay | Admitting: Orthopedic Surgery

## 2024-03-18 NOTE — Telephone Encounter (Signed)
 Dr. Areatha pt - returned the pt's call, lvm for the pt to cb

## 2024-04-01 ENCOUNTER — Ambulatory Visit: Admitting: Orthopedic Surgery

## 2024-04-03 ENCOUNTER — Encounter: Payer: Self-pay | Admitting: Orthopedic Surgery

## 2024-04-03 ENCOUNTER — Ambulatory Visit: Admitting: Orthopedic Surgery

## 2024-04-03 DIAGNOSIS — G5602 Carpal tunnel syndrome, left upper limb: Secondary | ICD-10-CM

## 2024-04-03 MED ORDER — NORTRIPTYLINE HCL 10 MG PO CAPS: 10.0000 mg | ORAL_CAPSULE | Freq: Every day | ORAL | 0 refills | Status: AC

## 2024-04-03 MED ORDER — METHYLPREDNISOLONE ACETATE 40 MG/ML IJ SUSP
40.0000 mg | Freq: Once | INTRAMUSCULAR | Status: AC
  Administered 2024-04-03: 40 mg via INTRA_ARTICULAR

## 2024-04-03 NOTE — Addendum Note (Signed)
 Addended byBETHA JENEAN GREIG LELON on: 04/03/2024 11:13 AM   Modules accepted: Orders

## 2024-04-03 NOTE — Progress Notes (Signed)
 Chief Complaint  Patient presents with   EMG & NCV Review    69 year old female follow-up today for discussion of results regarding EMG nerve conduction studies for left upper extremity paresthesias  Patient previously had left carpal tunnel syndrome and left carpal tunnel release in 2019 and now has recurrence of symptoms  EMG and nerve conduction study shows mild disease  After discussion with the patient we decided to try bracing vitamin B6 and gabapentin  like medicine   Recheck 6 weeks if no improvement may need to proceed with a repeat carpal tunnel release   Meds ordered this encounter  Medications   nortriptyline  (PAMELOR ) 10 MG capsule    Sig: Take 1 capsule (10 mg total) by mouth at bedtime.    Dispense:  42 capsule    Refill:  0

## 2024-04-03 NOTE — Patient Instructions (Signed)
 VIT B6 100 MG TWICE A DAY FOR 6 WEEKS

## 2024-04-03 NOTE — Progress Notes (Signed)
   There were no vitals taken for this visit.  There is no height or weight on file to calculate BMI.  Chief Complaint  Patient presents with   EMG & NCV Review    No diagnosis found.  DOI/DOS/ Date: ongoing review nerve study   Unchanged

## 2024-05-13 DIAGNOSIS — E559 Vitamin D deficiency, unspecified: Secondary | ICD-10-CM | POA: Diagnosis not present

## 2024-05-13 DIAGNOSIS — E782 Mixed hyperlipidemia: Secondary | ICD-10-CM | POA: Diagnosis not present

## 2024-05-13 DIAGNOSIS — E611 Iron deficiency: Secondary | ICD-10-CM | POA: Diagnosis not present

## 2024-05-13 DIAGNOSIS — E1169 Type 2 diabetes mellitus with other specified complication: Secondary | ICD-10-CM | POA: Diagnosis not present

## 2024-05-15 ENCOUNTER — Ambulatory Visit: Admitting: Orthopedic Surgery

## 2024-05-17 DIAGNOSIS — N182 Chronic kidney disease, stage 2 (mild): Secondary | ICD-10-CM | POA: Diagnosis not present

## 2024-05-17 DIAGNOSIS — E611 Iron deficiency: Secondary | ICD-10-CM | POA: Diagnosis not present

## 2024-05-17 DIAGNOSIS — E1169 Type 2 diabetes mellitus with other specified complication: Secondary | ICD-10-CM | POA: Diagnosis not present

## 2024-05-17 DIAGNOSIS — E782 Mixed hyperlipidemia: Secondary | ICD-10-CM | POA: Diagnosis not present

## 2024-05-17 DIAGNOSIS — R202 Paresthesia of skin: Secondary | ICD-10-CM | POA: Diagnosis not present

## 2024-05-17 DIAGNOSIS — M79601 Pain in right arm: Secondary | ICD-10-CM | POA: Diagnosis not present

## 2024-05-17 DIAGNOSIS — M25512 Pain in left shoulder: Secondary | ICD-10-CM | POA: Diagnosis not present

## 2024-05-17 DIAGNOSIS — M5416 Radiculopathy, lumbar region: Secondary | ICD-10-CM | POA: Diagnosis not present

## 2024-05-17 DIAGNOSIS — M79602 Pain in left arm: Secondary | ICD-10-CM | POA: Diagnosis not present

## 2024-05-20 ENCOUNTER — Encounter (INDEPENDENT_AMBULATORY_CARE_PROVIDER_SITE_OTHER): Payer: Self-pay | Admitting: *Deleted

## 2024-05-20 ENCOUNTER — Other Ambulatory Visit: Payer: Self-pay | Admitting: Internal Medicine

## 2024-05-27 ENCOUNTER — Encounter: Payer: Self-pay | Admitting: Radiology

## 2024-05-27 ENCOUNTER — Other Ambulatory Visit (HOSPITAL_COMMUNITY): Payer: Self-pay | Admitting: Internal Medicine

## 2024-05-27 DIAGNOSIS — M810 Age-related osteoporosis without current pathological fracture: Secondary | ICD-10-CM | POA: Insufficient documentation

## 2024-05-28 ENCOUNTER — Telehealth: Payer: Self-pay

## 2024-05-28 NOTE — Telephone Encounter (Signed)
 Auth Submission: NO AUTH NEEDED Site of care: Site of care: [facility] Payer: [employer] Medication & CPT/J Code(s) submitted: Jubbonti [id number] Diagnosis Code:  Route of submission (phone, fax, portal): portal Phone # Fax # Auth type: Buy/Bill PB Units/visits requested: 60mg , q39months Reference number: [id number] Approval from: 05/28/24 to 07/24/24

## 2024-06-10 ENCOUNTER — Encounter: Attending: Internal Medicine | Admitting: Emergency Medicine

## 2024-06-10 VITALS — BP 152/85 | HR 74 | Temp 98.0°F | Resp 16

## 2024-06-10 DIAGNOSIS — M810 Age-related osteoporosis without current pathological fracture: Secondary | ICD-10-CM

## 2024-06-10 MED ORDER — DENOSUMAB-BBDZ 60 MG/ML ~~LOC~~ SOSY
60.0000 mg | PREFILLED_SYRINGE | Freq: Once | SUBCUTANEOUS | Status: AC
  Administered 2024-06-10: 60 mg via SUBCUTANEOUS

## 2024-06-10 NOTE — Progress Notes (Signed)
 Diagnosis: Osteoporosis  Provider:  Christyne Hurst MD  Procedure: Injection  Jubbonti , Dose: 60 mg, Site: subcutaneous, Number of injections: 1  Injection Site(s): Right arm  Post Care: Observation period completed  Discharge: Condition: Good, Destination: Home . AVS Provided  Performed by:  Delon ONEIDA Officer, RN

## 2024-07-08 ENCOUNTER — Other Ambulatory Visit: Payer: Self-pay | Admitting: Orthopedic Surgery

## 2024-07-08 DIAGNOSIS — M25551 Pain in right hip: Secondary | ICD-10-CM

## 2024-08-06 NOTE — Progress Notes (Unsigned)
 "  Office Visit Note  Patient: Dawn Kirk             Date of Birth: January 02, 1956           MRN: [id number]             PCP: Shona Norleen PEDLAR, MD Referring: Margrette Taft BRAVO, MD Visit Date: 08/20/2024 Occupation: Data Unavailable  Subjective:  No chief complaint on file.   History of Present Illness: Dawn Kirk is a 69 y.o. female ***     Activities of Daily Living:  Patient reports morning stiffness for *** {minute/hour:19697}.   Patient {ACTIONS;DENIES/REPORTS:21021675::Denies} nocturnal pain.  Difficulty dressing/grooming: {ACTIONS;DENIES/REPORTS:21021675::Denies} Difficulty climbing stairs: {ACTIONS;DENIES/REPORTS:21021675::Denies} Difficulty getting out of chair: {ACTIONS;DENIES/REPORTS:21021675::Denies} Difficulty using hands for taps, buttons, cutlery, and/or writing: {ACTIONS;DENIES/REPORTS:21021675::Denies}  No Rheumatology ROS completed.   PMFS History:  Patient Active Problem List   Diagnosis Date Noted   OP (osteoporosis) 05/27/2024   S/P carpal tunnel release left 11/30/17    Essential hypertension 05/08/2014   Pure hypercholesterolemia 05/08/2014   Diabetes type 2, controlled (HCC) 05/08/2014   Spondylolisthesis at L5-S1 level 02/01/2014   KNEE PAIN 07/10/2007   PES ANSERINUS TENDINITIS OR BURSITIS 07/10/2007    Past Medical History:  Diagnosis Date   Anxiety    Arthritis    Asthma    related to bronchitis-no recent issues   Depression    Diabetes mellitus    GERD (gastroesophageal reflux disease)    Headache(784.0)    Hypercholesterolemia    Hypertension    Peripheral neuropathy    PONV (postoperative nausea and vomiting)    Psoriasis     Family History  Problem Relation Age of Onset   Asthma Other    COPD Other    Hypertension Other    Stroke Other    Heart disease Other    Heart disease Father    Dementia Father    Cancer Paternal Grandfather        lung   Stroke Mother    Past Surgical History:  Procedure Laterality  Date   CARDIAC CATHETERIZATION  2011-[place]   CARPAL TUNNEL RELEASE Left 11/30/2017   Procedure: CARPAL TUNNEL RELEASE;  Surgeon: Margrette Taft BRAVO, MD;  Location: [facility];  Service: Orthopedics;  Laterality: Left;   CESAREAN SECTION  1984   CHOLECYSTECTOMY  2003   COLONOSCOPY  2009   ENDOMETRIAL ABLATION  2006   FOOT SURGERY  2006   HAMMER TOE SURGERY  12/01/2011   Procedure: HAMMER TOE CORRECTION;  Surgeon: Morene Donley Anon, DPM;  Location: [facility];  Service: Orthopedics;  Laterality: Right;  Arthroplasty 5th Digit Right Foot   NECK SURGERY  x2-2004   POSTERIOR LUMBAR FUSION N/A 02/01/2014   Procedure: POSTERIOR LUMBAR FUSION LUMBAR FIVE-SACRAL ONE INTERBODY FUSION WITH INTERBODY FUSION WITH INTERBODY PROSTHESIS POSTERIOR LATERAL ARTHRODESIS POSTERIOR NONSEGMENTAL INSTRUMENTATION;  Surgeon: Rockey LITTIE Peru, MD;  Location: [facility];  Service: Neurosurgery;  Laterality: N/A;  Lumbar 5 GILL procedure, POSTERIOR LATERAL ARTHRODESIS POSTERIOR NONSEGMENTAL INSTRUMENTATION   REPLACEMENT TOTAL KNEE BILATERAL  2007   [facility], Dr. Margrette   STERIOD INJECTION  07/08/2011   Procedure: STEROID INJECTION;  Surgeon: Donnice DELENA Robinsons, MD;  Location: [facility];  Service: Orthopedics;  Laterality: Left;  injection 1ml celestone  left index finger   TUBAL LIGATION  1989   WRIST ARTHROSCOPY  07/08/2011   Procedure: ARTHROSCOPY WRIST;  Surgeon: Donnice DELENA Robinsons, MD;  Location: [facility];  Service: Orthopedics;  Laterality:  Right;  right wrist with ganglionectomy,    Social History[1] Social History   Social History Narrative   Not on file     Immunization History  Administered Date(s) Administered   Influenza-Unspecified 05/03/2016   Pneumococcal Polysaccharide-23 02/03/2014     Objective: Vital Signs: There were no vitals taken for this visit.   Physical Exam   Musculoskeletal Exam: ***  CDAI Exam: CDAI Score: -- Patient Global: --; Provider Global:  -- Swollen: --; Tender: -- Joint Exam 08/20/2024   No joint exam has been documented for this visit   There is currently no information documented on the homunculus. Go to the Rheumatology activity and complete the homunculus joint exam.  Investigation: No additional findings.  Imaging: No results found.  Recent Labs: Lab Results  Component Value Date   WBC 9.1 11/28/2017   HGB 15.5 (H) 11/28/2017   PLT 252 11/28/2017   NA 138 11/28/2017   K 3.6 11/28/2017   CL 94 (L) 11/28/2017   CO2 31 11/28/2017   GLUCOSE 118 (H) 11/28/2017   BUN 15 11/28/2017   CREATININE 1.11 (H) 11/28/2017   BILITOT 0.8 09/30/2009   ALKPHOS 85 09/30/2009   AST 20 09/30/2009   ALT 18 09/30/2009   PROT 6.3 09/30/2009   ALBUMIN 3.5 09/30/2009   CALCIUM  9.4 11/28/2017   GFRAA >60 11/28/2017   February 01, 2024 x-ray of the right hand reported normal. February 01, 2024 x-ray of the right shoulder suspicious of possible rotator cuff tendinopathy and insufficiency per radiology report. February 01, 2024 x-ray of pelvis  Speciality Comments: No specialty comments available.  Procedures:  No procedures performed Allergies: Codeine, Gabapentin , Levofloxacin, and Adhesive [tape]   Assessment / Plan:     Visit Diagnoses: Pain in right hand  S/P carpal tunnel release left 11/30/17  Arthralgia of both lower legs  Spondylolisthesis at L5-S1 level  Diabetes type 2, controlled (HCC)  Essential hypertension  Pure hypercholesterolemia  Age-related osteoporosis without current pathological fracture - The BMD measured at Femur Neck Left is 0.606 g/cm2 with a T-score of -3.1.  Orders: No orders of the defined types were placed in this encounter.  No orders of the defined types were placed in this encounter.   Face-to-face time spent with patient was *** minutes. Greater than 50% of time was spent in counseling and coordination of care.  Follow-Up Instructions: No follow-ups on file.   Maya Nash,  MD  Note - This record has been created using Animal nutritionist.  Chart creation errors have been sought, but may not always  have been located. Such creation errors do not reflect on  the standard of medical care.    [1]  Social History Tobacco Use   Smoking status: Every Day    Current packs/day: 0.50    Average packs/day: 0.5 packs/day for 45.0 years (22.5 ttl pk-yrs)    Types: Cigarettes   Smokeless tobacco: Never  Vaping Use   Vaping status: Never Used  Substance Use Topics   Alcohol use: No   Drug use: No   "

## 2024-08-20 ENCOUNTER — Encounter: Admitting: Rheumatology

## 2024-08-20 DIAGNOSIS — E7800 Pure hypercholesterolemia, unspecified: Secondary | ICD-10-CM

## 2024-08-20 DIAGNOSIS — M4317 Spondylolisthesis, lumbosacral region: Secondary | ICD-10-CM

## 2024-08-20 DIAGNOSIS — I10 Essential (primary) hypertension: Secondary | ICD-10-CM

## 2024-08-20 DIAGNOSIS — M25561 Pain in right knee: Secondary | ICD-10-CM

## 2024-08-20 DIAGNOSIS — M810 Age-related osteoporosis without current pathological fracture: Secondary | ICD-10-CM

## 2024-08-20 DIAGNOSIS — Z9889 Other specified postprocedural states: Secondary | ICD-10-CM

## 2024-08-20 DIAGNOSIS — M79641 Pain in right hand: Secondary | ICD-10-CM

## 2024-08-20 DIAGNOSIS — E119 Type 2 diabetes mellitus without complications: Secondary | ICD-10-CM

## 2024-09-19 ENCOUNTER — Ambulatory Visit: Admitting: Rheumatology

## 2024-12-09 ENCOUNTER — Ambulatory Visit
